# Patient Record
Sex: Male | Born: 1970
Health system: Southern US, Community
[De-identification: ages and names within clinical notes are randomized; demographics above are authoritative.]

## PROBLEM LIST (undated history)

## (undated) DIAGNOSIS — J189 Pneumonia, unspecified organism: Secondary | ICD-10-CM

## (undated) DIAGNOSIS — I10 Essential (primary) hypertension: Secondary | ICD-10-CM

## (undated) DIAGNOSIS — K219 Gastro-esophageal reflux disease without esophagitis: Secondary | ICD-10-CM

## (undated) DIAGNOSIS — Z992 Dependence on renal dialysis: Secondary | ICD-10-CM

## (undated) DIAGNOSIS — I739 Peripheral vascular disease, unspecified: Secondary | ICD-10-CM

## (undated) DIAGNOSIS — Z9989 Dependence on other enabling machines and devices: Secondary | ICD-10-CM

## (undated) DIAGNOSIS — N186 End stage renal disease: Secondary | ICD-10-CM

## (undated) DIAGNOSIS — N289 Disorder of kidney and ureter, unspecified: Secondary | ICD-10-CM

## (undated) DIAGNOSIS — T4145XA Adverse effect of unspecified anesthetic, initial encounter: Secondary | ICD-10-CM

## (undated) DIAGNOSIS — T8859XA Other complications of anesthesia, initial encounter: Secondary | ICD-10-CM

## (undated) DIAGNOSIS — Z9289 Personal history of other medical treatment: Secondary | ICD-10-CM

## (undated) DIAGNOSIS — G4733 Obstructive sleep apnea (adult) (pediatric): Secondary | ICD-10-CM

## (undated) HISTORY — DX: Peripheral vascular disease, unspecified: I73.9

---

## 2009-06-03 DIAGNOSIS — N4601 Organic azoospermia: Secondary | ICD-10-CM | POA: Insufficient documentation

## 2009-06-03 DIAGNOSIS — N62 Hypertrophy of breast: Secondary | ICD-10-CM | POA: Insufficient documentation

## 2009-06-14 DIAGNOSIS — E229 Hyperfunction of pituitary gland, unspecified: Secondary | ICD-10-CM | POA: Insufficient documentation

## 2012-07-17 HISTORY — PX: KIDNEY TRANSPLANT: SHX239

## 2012-07-17 HISTORY — PX: AV FISTULA PLACEMENT: SHX1204

## 2012-07-17 HISTORY — PX: PARATHYROIDECTOMY: SHX19

## 2018-01-29 ENCOUNTER — Inpatient Hospital Stay (HOSPITAL_COMMUNITY)
Admission: EM | Admit: 2018-01-29 | Discharge: 2018-02-04 | DRG: 682 | Disposition: A | Discharge status: Home / Self Care | Payer: Medicare Other | Attending: Internal Medicine | Admitting: Internal Medicine

## 2018-01-29 ENCOUNTER — Other Ambulatory Visit: Payer: Self-pay

## 2018-01-29 ENCOUNTER — Emergency Department (HOSPITAL_COMMUNITY): Payer: Medicare Other

## 2018-01-29 ENCOUNTER — Encounter (HOSPITAL_COMMUNITY): Payer: Self-pay

## 2018-01-29 DIAGNOSIS — I160 Hypertensive urgency: Secondary | ICD-10-CM

## 2018-01-29 DIAGNOSIS — I5030 Unspecified diastolic (congestive) heart failure: Secondary | ICD-10-CM | POA: Diagnosis present

## 2018-01-29 DIAGNOSIS — E890 Postprocedural hypothyroidism: Secondary | ICD-10-CM | POA: Diagnosis present

## 2018-01-29 DIAGNOSIS — D631 Anemia in chronic kidney disease: Secondary | ICD-10-CM | POA: Diagnosis present

## 2018-01-29 DIAGNOSIS — I248 Other forms of acute ischemic heart disease: Secondary | ICD-10-CM | POA: Diagnosis present

## 2018-01-29 DIAGNOSIS — T8619 Other complication of kidney transplant: Secondary | ICD-10-CM | POA: Diagnosis present

## 2018-01-29 DIAGNOSIS — J189 Pneumonia, unspecified organism: Secondary | ICD-10-CM | POA: Diagnosis present

## 2018-01-29 DIAGNOSIS — N179 Acute kidney failure, unspecified: Secondary | ICD-10-CM | POA: Diagnosis present

## 2018-01-29 DIAGNOSIS — F17290 Nicotine dependence, other tobacco product, uncomplicated: Secondary | ICD-10-CM | POA: Diagnosis present

## 2018-01-29 DIAGNOSIS — Z79899 Other long term (current) drug therapy: Secondary | ICD-10-CM | POA: Diagnosis not present

## 2018-01-29 DIAGNOSIS — Z9119 Patient's noncompliance with other medical treatment and regimen: Secondary | ICD-10-CM | POA: Diagnosis not present

## 2018-01-29 DIAGNOSIS — N186 End stage renal disease: Secondary | ICD-10-CM | POA: Diagnosis present

## 2018-01-29 DIAGNOSIS — I132 Hypertensive heart and chronic kidney disease with heart failure and with stage 5 chronic kidney disease, or end stage renal disease: Secondary | ICD-10-CM | POA: Diagnosis present

## 2018-01-29 DIAGNOSIS — D649 Anemia, unspecified: Secondary | ICD-10-CM

## 2018-01-29 HISTORY — DX: Essential (primary) hypertension: I10

## 2018-01-29 HISTORY — DX: Disorder of kidney and ureter, unspecified: N28.9

## 2018-01-29 HISTORY — DX: Other complications of anesthesia, initial encounter: T88.59XA

## 2018-01-29 HISTORY — DX: Dependence on renal dialysis: Z99.2

## 2018-01-29 HISTORY — DX: End stage renal disease: N18.6

## 2018-01-29 HISTORY — DX: Dependence on other enabling machines and devices: Z99.89

## 2018-01-29 HISTORY — DX: Obstructive sleep apnea (adult) (pediatric): G47.33

## 2018-01-29 HISTORY — DX: Adverse effect of unspecified anesthetic, initial encounter: T41.45XA

## 2018-01-29 LAB — CBC WITH DIFFERENTIAL/PLATELET
BASOS ABS: 0.1 10*3/uL (ref 0.0–0.1)
Basophils Relative: 1 %
Eosinophils Absolute: 0.1 10*3/uL (ref 0.0–0.7)
Eosinophils Relative: 1 %
HEMATOCRIT: 31 % — AB (ref 39.0–52.0)
Hemoglobin: 10.7 g/dL — ABNORMAL LOW (ref 13.0–17.0)
LYMPHS PCT: 9 %
Lymphs Abs: 1.2 10*3/uL (ref 0.7–4.0)
MCH: 30.3 pg (ref 26.0–34.0)
MCHC: 34.5 g/dL (ref 30.0–36.0)
MCV: 87.8 fL (ref 78.0–100.0)
Monocytes Absolute: 1.1 10*3/uL — ABNORMAL HIGH (ref 0.1–1.0)
Monocytes Relative: 9 %
NEUTROS ABS: 10.4 10*3/uL — AB (ref 1.7–7.7)
Neutrophils Relative %: 80 %
PLATELETS: 295 10*3/uL (ref 150–400)
RBC: 3.53 MIL/uL — ABNORMAL LOW (ref 4.22–5.81)
RDW: 15.6 % — ABNORMAL HIGH (ref 11.5–15.5)
WBC: 12.9 10*3/uL — AB (ref 4.0–10.5)

## 2018-01-29 LAB — COMPREHENSIVE METABOLIC PANEL
ALBUMIN: 3.7 g/dL (ref 3.5–5.0)
ALT: 11 U/L (ref 0–44)
ANION GAP: 23 — AB (ref 5–15)
AST: 35 U/L (ref 15–41)
Alkaline Phosphatase: 82 U/L (ref 38–126)
BILIRUBIN TOTAL: 0.7 mg/dL (ref 0.3–1.2)
BUN: 133 mg/dL — ABNORMAL HIGH (ref 6–20)
CO2: 14 mmol/L — ABNORMAL LOW (ref 22–32)
Calcium: 6.2 mg/dL — CL (ref 8.9–10.3)
Chloride: 107 mmol/L (ref 98–111)
Creatinine, Ser: 19.32 mg/dL — ABNORMAL HIGH (ref 0.61–1.24)
GFR calc Af Amer: 3 mL/min — ABNORMAL LOW (ref >60)
GFR, EST NON AFRICAN AMERICAN: 2 mL/min — AB (ref >60)
Glucose, Bld: 93 mg/dL (ref 70–99)
POTASSIUM: 5.1 mmol/L (ref 3.5–5.1)
Sodium: 144 mmol/L (ref 135–145)
TOTAL PROTEIN: 8.2 g/dL — AB (ref 6.5–8.1)

## 2018-01-29 LAB — ETHANOL: Alcohol, Ethyl (B): <10 mg/dL (ref <10)

## 2018-01-29 LAB — I-STAT TROPONIN, ED: Troponin i, poc: 0.14 ng/mL (ref 0.00–0.08)

## 2018-01-29 LAB — SALICYLATE LEVEL

## 2018-01-29 LAB — ACETAMINOPHEN LEVEL

## 2018-01-29 LAB — CK: Total CK: 268 U/L (ref 49–397)

## 2018-01-29 MED ORDER — ACETAMINOPHEN 325 MG PO TABS
650.0000 mg | ORAL_TABLET | Freq: Four times a day (QID) | ORAL | Status: DC | PRN
  Administered 2018-01-31: 650 mg via ORAL
  Filled 2018-01-29: qty 2

## 2018-01-29 MED ORDER — ONDANSETRON HCL 4 MG/2ML IJ SOLN
4.0000 mg | Freq: Four times a day (QID) | INTRAMUSCULAR | Status: DC | PRN
  Administered 2018-01-29: 4 mg via INTRAVENOUS
  Filled 2018-01-29: qty 2

## 2018-01-29 MED ORDER — VANCOMYCIN HCL 10 G IV SOLR
2500.0000 mg | Freq: Once | INTRAVENOUS | Status: DC
  Filled 2018-01-29: qty 2500

## 2018-01-29 MED ORDER — ACETAMINOPHEN 650 MG RE SUPP: 650.0000 mg | Freq: Four times a day (QID) | RECTAL | Status: DC | PRN

## 2018-01-29 MED ORDER — SODIUM CHLORIDE 0.9% FLUSH
3.0000 mL | Freq: Two times a day (BID) | INTRAVENOUS | Status: DC
  Administered 2018-01-30 – 2018-02-03 (×11): 3 mL via INTRAVENOUS

## 2018-01-29 MED ORDER — HYDRALAZINE HCL 20 MG/ML IJ SOLN
10.0000 mg | Freq: Once | INTRAMUSCULAR | Status: AC
  Administered 2018-01-30: 10 mg via INTRAVENOUS
  Filled 2018-01-29: qty 1

## 2018-01-29 MED ORDER — HEPARIN SODIUM (PORCINE) 5000 UNIT/ML IJ SOLN
5000.0000 [IU] | Freq: Three times a day (TID) | INTRAMUSCULAR | Status: DC
  Administered 2018-01-30 – 2018-02-04 (×17): 5000 [IU] via SUBCUTANEOUS
  Filled 2018-01-29 (×19): qty 1

## 2018-01-29 MED ORDER — SODIUM CHLORIDE 0.9 % IV BOLUS
1000.0000 mL | Freq: Once | INTRAVENOUS | Status: AC
  Administered 2018-01-29: 1000 mL via INTRAVENOUS

## 2018-01-29 MED ORDER — AMLODIPINE BESYLATE 5 MG PO TABS
5.0000 mg | ORAL_TABLET | Freq: Every day | ORAL | Status: DC
  Administered 2018-01-29 – 2018-02-02 (×5): 5 mg via ORAL
  Filled 2018-01-29 (×5): qty 1

## 2018-01-29 MED ORDER — HYDRALAZINE HCL 20 MG/ML IJ SOLN
10.0000 mg | Freq: Four times a day (QID) | INTRAMUSCULAR | Status: DC | PRN
  Administered 2018-01-29 – 2018-02-03 (×6): 10 mg via INTRAVENOUS
  Filled 2018-01-29 (×6): qty 1

## 2018-01-29 MED ORDER — SODIUM CHLORIDE 0.9 % IV SOLN
2.0000 g | Freq: Once | INTRAVENOUS | Status: AC
  Administered 2018-01-29: 2 g via INTRAVENOUS
  Filled 2018-01-29: qty 2

## 2018-01-29 MED ORDER — LABETALOL HCL 5 MG/ML IV SOLN
10.0000 mg | Freq: Once | INTRAVENOUS | Status: AC
  Administered 2018-01-29: 10 mg via INTRAVENOUS
  Filled 2018-01-29: qty 4

## 2018-01-29 MED ORDER — SODIUM CHLORIDE 0.9 % IV SOLN
250.0000 mL | INTRAVENOUS | Status: DC | PRN
  Administered 2018-01-30: 250 mL via INTRAVENOUS

## 2018-01-29 MED ORDER — CARVEDILOL 12.5 MG PO TABS
12.5000 mg | ORAL_TABLET | Freq: Two times a day (BID) | ORAL | Status: DC
  Administered 2018-01-30 – 2018-02-02 (×8): 12.5 mg via ORAL
  Filled 2018-01-29 (×8): qty 1

## 2018-01-29 MED ORDER — SODIUM CHLORIDE 0.9% FLUSH: 3.0000 mL | INTRAVENOUS | Status: DC | PRN

## 2018-01-29 MED ORDER — SODIUM CHLORIDE 0.9 % IV SOLN
500.0000 mg | INTRAVENOUS | Status: DC
  Administered 2018-01-29 – 2018-02-01 (×4): 500 mg via INTRAVENOUS
  Filled 2018-01-29 (×4): qty 500

## 2018-01-29 MED ORDER — SODIUM CHLORIDE 0.9 % IV SOLN
1.0000 g | INTRAVENOUS | Status: AC
  Administered 2018-01-30 – 2018-02-02 (×4): 1 g via INTRAVENOUS
  Filled 2018-01-29 (×4): qty 10

## 2018-01-29 NOTE — ED Notes (Signed)
Dr. Kim at bedside   

## 2018-01-29 NOTE — ED Notes (Signed)
Pt aware that urine sample is needed.  

## 2018-01-29 NOTE — ED Notes (Signed)
Bed: PT47 Expected date:  Expected time:  Means of arrival:  Comments: Hold for triage 1

## 2018-01-29 NOTE — ED Notes (Signed)
Bed: WLPT1 Expected date:  Expected time:  Means of arrival:  Comments: 

## 2018-01-29 NOTE — ED Notes (Signed)
ED TO INPATIENT HANDOFF REPORT  Name/Age/Gender Jerry [name] 47 y.o. male  Code Status   Home/SNF/Other Home  Chief Complaint Arm Swelling; SOB; Blood in Urine; Unable to eat/drink  Level of Care/Admitting Diagnosis ED Disposition    ED Disposition Condition [place]: [place] [100100]  Level of Care: Telemetry [5]  Diagnosis: ARF (acute renal failure) [facility]) [[id number]]  Admitting Physician: Jani Gravel [[id number]]  Attending Physician: Jani Gravel [phone]  Estimated length of stay: past midnight tomorrow  Certification:: I certify this patient will need inpatient services for at least 2 midnights  PT Class (Do Not Modify): Inpatient [101]  PT Acc Code (Do Not Modify): Private [1]       Medical History Past Medical History:  Diagnosis Date  . Hypertension   . Renal disorder     Allergies No Known Allergies  IV Location/Drains/Wounds Patient Lines/Drains/Airways Status   Active Line/Drains/Airways    None          Labs/Imaging Results for orders placed or performed during the hospital encounter of 01/29/18 (from the past 48 hour(s))  Ethanol     Status: None   Collection Time: 01/29/18  4:52 PM  Result Value Ref Range   Alcohol, Ethyl (B) <10 <10 mg/dL    Comment: (NOTE) Lowest detectable limit for serum alcohol is 10 mg/dL. For medical purposes only. Performed at [facility], [place] [address], [place], [place] [address]   Salicylate level     Status: None   Collection Time: 01/29/18  4:52 PM  Result Value Ref Range   Salicylate Lvl <6.5 2.8 - 30.0 mg/dL    Comment: Performed at [facility], [place] [address], [place], [place] [address]  Acetaminophen level     Status: Abnormal   Collection Time: 01/29/18  4:52 PM  Result Value Ref Range   Acetaminophen (Tylenol), Serum <10 (L) 10 - 30 ug/mL    Comment: (NOTE) Therapeutic concentrations vary significantly. A range of 10-30  ug/mL  may be an effective concentration for many patients. However, some  are best treated at concentrations outside of this range. Acetaminophen concentrations >150 ug/mL at 4 hours after ingestion  and >50 ug/mL at 12 hours after ingestion are often associated with  toxic reactions. Performed at [facility], [place] [address], [place], [place] [address]   I-stat troponin, ED     Status: Abnormal   Collection Time: 01/29/18  4:59 PM  Result Value Ref Range   Troponin i, poc 0.14 (HH) 0.00 - 0.08 ng/mL   Comment NOTIFIED PHYSICIAN    Comment 3            Comment: Due to the release kinetics of cTnI, a negative result within the first hours of the onset of symptoms does not rule out myocardial infarction with certainty. If myocardial infarction is still suspected, repeat the test at appropriate intervals.   Comprehensive metabolic panel     Status: Abnormal   Collection Time: 01/29/18  5:05 PM  Result Value Ref Range   Sodium 144 135 - 145 mmol/L    Comment: REPEATED TO VERIFY   Potassium 5.1 3.5 - 5.1 mmol/L    Comment: REPEATED TO VERIFY   Chloride 107 98 - 111 mmol/L    Comment: Please note change in reference range. REPEATED TO VERIFY    CO2 14 (L) 22 - 32 mmol/L    Comment: REPEATED TO VERIFY   Glucose,  Bld 93 70 - 99 mg/dL    Comment: Please note change in reference range.   BUN 133 (H) 6 - 20 mg/dL    Comment: RESULTS CONFIRMED BY MANUAL DILUTION   Creatinine, Ser 19.32 (H) 0.61 - 1.24 mg/dL   Calcium 6.2 (LL) 8.9 - 10.3 mg/dL    Comment: CRITICAL RESULT CALLED TO, READ BACK BY AND VERIFIED WITH: ZULETA,K RN [id number] COVINGTON,N    Total Protein 8.2 (H) 6.5 - 8.1 g/dL   Albumin 3.7 3.5 - 5.0 g/dL   AST 35 15 - 41 U/L   ALT 11 0 - 44 U/L    Comment: Please note change in reference range.   Alkaline Phosphatase 82 38 - 126 U/L   Total Bilirubin 0.7 0.3 - 1.2 mg/dL   GFR calc non Af Amer 2 (L) >60 mL/min   GFR calc Af Amer 3 (L) >60 mL/min     Comment: (NOTE) The eGFR has been calculated using the CKD EPI equation. This calculation has not been validated in all clinical situations. eGFR's persistently <60 mL/min signify possible Chronic Kidney Disease.    Anion gap 23 (H) 5 - 15    Comment: Performed at [facility], [place] [address] [place], [place] [address]  CBC with Differential     Status: Abnormal   Collection Time: 01/29/18  5:05 PM  Result Value Ref Range   WBC 12.9 (H) 4.0 - 10.5 K/uL   RBC 3.53 (L) 4.22 - 5.81 MIL/uL   Hemoglobin 10.7 (L) 13.0 - 17.0 g/dL   HCT 31.0 (L) 39.0 - 52.0 %   MCV 87.8 78.0 - 100.0 fL   MCH 30.3 26.0 - 34.0 pg   MCHC 34.5 30.0 - 36.0 g/dL   RDW 15.6 (H) 11.5 - 15.5 %   Platelets 295 150 - 400 K/uL   Neutrophils Relative % 80 %   Neutro Abs 10.4 (H) 1.7 - 7.7 K/uL   Lymphocytes Relative 9 %   Lymphs Abs 1.2 0.7 - 4.0 K/uL   Monocytes Relative 9 %   Monocytes Absolute 1.1 (H) 0.1 - 1.0 K/uL   Eosinophils Relative 1 %   Eosinophils Absolute 0.1 0.0 - 0.7 K/uL   Basophils Relative 1 %   Basophils Absolute 0.1 0.0 - 0.1 K/uL    Comment: Performed at [facility], [place] [address], [place], [place] [address]  CK     Status: None   Collection Time: 01/29/18  5:05 PM  Result Value Ref Range   Total CK 268 49 - 397 U/L    Comment: Performed at [facility], [place] [address], [place], [place] [address]   Dg Chest 2 View  Result Date: 01/29/2018 CLINICAL DATA:  Shortness of breath and chest pain EXAM: CHEST - 2 VIEW COMPARISON:  None. FINDINGS: There is airspace consolidation in the left upper lobe, felt to represent pneumonia. There is more subtle consolidation in the left base. There is a right pleural effusion with fluid tracking into the right minor fissure. There is cardiomegaly with pulmonary venous hypertension. No adenopathy. No evident bone lesion. IMPRESSION: Left upper lobe airspace consolidation, felt to represent pneumonia.  Smaller area of consolidation left base. Right pleural effusion. Pulmonary vascular congestion. Electronically Signed   By: Lowella Grip III M.D.   On: 01/29/2018 14:04    Pending Labs Unresulted Labs (From admission, onward)   Start     Ordered   01/29/18 1732  Urinalysis, Routine  w reflex microscopic  STAT,   STAT     01/29/18 1731   01/29/18 1350  Rapid urine drug screen (hospital performed)  Once,   STAT     01/29/18 1351      Vitals/Pain Today's Vitals   01/29/18 1935 01/29/18 1945 01/29/18 2000 01/29/18 2030  BP:  (!) 188/117 (!) 188/128 (!) 178/102  Pulse:  93 91 92  Resp:  (!) 22 (!) 25 16  Temp:      TempSrc:      SpO2:  98% 97% 96%  Weight:      Height:      PainSc: 0-No pain       Isolation Precautions No active isolations  Medications Medications  sodium chloride 0.9 % bolus 1,000 mL (0 mLs Intravenous Stopped 01/29/18 2051)  labetalol (NORMODYNE,TRANDATE) injection 10 mg (10 mg Intravenous Given 01/29/18 1805)  ceFEPIme (MAXIPIME) 2 g in sodium chloride 0.9 % 100 mL IVPB (0 g Intravenous Stopped 01/29/18 1947)    Mobility walks with person assist (due to weakness)

## 2018-01-29 NOTE — ED Notes (Signed)
[facility] contacted 

## 2018-01-29 NOTE — Progress Notes (Signed)
A consult was received from an ED physician for cefepime per pharmacy dosing (for an indication other than meningitis). The patient's profile has been reviewed for ht/wt/allergies/indication/available labs. A one time order has been placed for the above antibiotics.  Further antibiotics/pharmacy consults should be ordered by admitting physician if indicated.  NOTE: Both vancomycin and cefepime consults were initially ordered; however after speaking with EDP regarding poor renal function s/p transplant, it was decided to hold off on vanc for now and defer MRSA coverage to admitting MD.                 Reuel Boom, PharmD, BCPS [phone] 01/29/2018, 6:44 PM

## 2018-01-29 NOTE — ED Provider Notes (Signed)
[facility] DEPT Provider Note   CSN: [id number] Arrival date & time: 01/29/18  1259     History   Chief Complaint Chief Complaint  Patient presents with  . Shortness of Breath  . Epistaxis  . Arm Swelling  . Hematuria  . Suicidal    HPI Jerry [name] is a 47 y.o. male with a past medical history of hypertension, status post kidney transplant 5 years ago, who presents to ED for multiple complaints.  He reports shortness of breath and cough productive with mucus for the past week, as well as chest tightness.  He also reports hematuria for the past 2 days and decreased urination. States that epistaxis began yesterday.  He does report nasal congestion and rhinorrhea.  Denies any blood thinner use.  States that the epistaxis has improved without intervention.  He reports nausea, decreased appetite.  States that he is anxious that there is something going wrong with him.  He denies any SI, HI, AVH.  Patient is visiting [place] from [place].  States that he has not taken his blood pressure medication in 1 month, because "I just don't feel like it."  Patient had a 7hr car drive to [place] last month. Denies any leg swelling, wheezing, abdominal pain, fever, sick contacts, prior MI, DVT or PE, recent surgeries, headache, vision changes.  HPI  Past Medical History:  Diagnosis Date  . Hypertension   . Renal disorder     Patient Active Problem List   Diagnosis Date Noted  . ARF (acute renal failure) ([facility]) 01/29/2018    Past Surgical History:  Procedure Laterality Date  . KIDNEY TRANSPLANT          Home Medications    Prior to Admission medications   Medication Sig Start Date End Date Taking? Authorizing Provider  escitalopram (LEXAPRO) 20 MG tablet Take 10 mg by mouth daily.     [provider]  lisinopril (PRINIVIL,ZESTRIL) 2.5 MG tablet Take 10 mg by mouth daily.     [provider]  Mycophenolate Sodium (MYCOPHENOLIC  ACID PO) Take 1 tablet by mouth daily.    [provider]  tacrolimus (PROGRAF) 0.5 MG capsule Take 0.5 mg by mouth 2 (two) times daily.    [provider]    Family History History reviewed. No pertinent family history.  Social History Social History   Tobacco Use  . Smoking status: Current Some Day Smoker    Types: Cigars  . Smokeless tobacco: Never Used  Substance Use Topics  . Alcohol use: Yes    Comment: occasionally  . Drug use: Never     Allergies   Patient has no known allergies.   Review of Systems Review of Systems  Constitutional: Positive for appetite change. Negative for chills and fever.  HENT: Positive for congestion and rhinorrhea. Negative for ear pain, sneezing and sore throat.   Eyes: Negative for photophobia and visual disturbance.  Respiratory: Positive for cough, chest tightness and shortness of breath. Negative for wheezing.   Cardiovascular: Negative for chest pain and palpitations.  Gastrointestinal: Positive for nausea. Negative for abdominal pain, blood in stool, constipation, diarrhea and vomiting.  Genitourinary: Positive for hematuria. Negative for dysuria, flank pain and urgency.  Musculoskeletal: Negative for myalgias.  Skin: Negative for rash.  Neurological: Negative for dizziness, weakness and light-headedness.  Psychiatric/Behavioral: The patient is nervous/anxious.      Physical Exam Updated Vital Signs BP (!) 178/102   Pulse 92   Temp 98.8 F (37.1  C) (Oral)   Resp 16   Ht 5\' 8"  (1.727 m)   Wt 101.5 kg (223 lb 11.2 oz)   SpO2 96%   BMI 34.01 kg/m   Physical Exam  Constitutional: He appears well-developed and well-nourished. No distress.  HENT:  Head: Normocephalic and atraumatic.  Nose: Nose normal.  Eyes: Conjunctivae and EOM are normal. Left eye exhibits no discharge. No scleral icterus.  Neck: Normal range of motion. Neck supple.  Cardiovascular: Normal rate, regular rhythm, normal heart sounds and  intact distal pulses. Exam reveals no gallop and no friction rub.  No murmur heard. Pulmonary/Chest: Effort normal and breath sounds normal. No respiratory distress.  Abdominal: Soft. Bowel sounds are normal. He exhibits no distension. There is no tenderness. There is no guarding.  No abdominal tenderness to palpation.  Musculoskeletal: Normal range of motion. He exhibits no edema.  No lower extremity edema, erythema or calf tenderness bilaterally.  Neurological: He is alert. He exhibits normal muscle tone. Coordination normal.  Skin: Skin is warm and dry. No rash noted.  Psychiatric: He has a normal mood and affect.  Nursing note and vitals reviewed.    ED Treatments / Results  Labs (all labs ordered are listed, but only abnormal results are displayed) Labs Reviewed  ACETAMINOPHEN LEVEL - Abnormal; Notable for the following components:      Result Value   Acetaminophen (Tylenol), Serum <10 (*)    All other components within normal limits  COMPREHENSIVE METABOLIC PANEL - Abnormal; Notable for the following components:   CO2 14 (*)    BUN 133 (*)    Creatinine, Ser 19.32 (*)    Calcium 6.2 (*)    Total Protein 8.2 (*)    GFR calc non Af Amer 2 (*)    GFR calc Af Amer 3 (*)    Anion gap 23 (*)    All other components within normal limits  CBC WITH DIFFERENTIAL/PLATELET - Abnormal; Notable for the following components:   WBC 12.9 (*)    RBC 3.53 (*)    Hemoglobin 10.7 (*)    HCT 31.0 (*)    RDW 15.6 (*)    Neutro Abs 10.4 (*)    Monocytes Absolute 1.1 (*)    All other components within normal limits  I-STAT TROPONIN, ED - Abnormal; Notable for the following components:   Troponin i, poc 0.14 (*)    All other components within normal limits  ETHANOL  SALICYLATE LEVEL  CK  RAPID URINE DRUG SCREEN, HOSP PERFORMED  URINALYSIS, ROUTINE W REFLEX MICROSCOPIC    EKG EKG Interpretation  Date/Time:  Tuesday January 29 2018 13:50:09 EDT Ventricular Rate:  101 PR Interval:      QRS Duration: 90 QT Interval:  397 QTC Calculation: 515 R Axis:   -31 Text Interpretation:  Sinus tachycardia LAE, consider biatrial enlargement Left ventricular hypertrophy Anterior infarct, old Abnormal T, consider ischemia, lateral leads Prolonged QT interval No old tracing to compare Confirmed by Duffy Bruce [id number] on 01/29/2018 5:43:46 PM   Radiology Dg Chest 2 View  Result Date: 01/29/2018 CLINICAL DATA:  Shortness of breath and chest pain EXAM: CHEST - 2 VIEW COMPARISON:  None. FINDINGS: There is airspace consolidation in the left upper lobe, felt to represent pneumonia. There is more subtle consolidation in the left base. There is a right pleural effusion with fluid tracking into the right minor fissure. There is cardiomegaly with pulmonary venous hypertension. No adenopathy. No evident bone lesion. IMPRESSION: Left upper lobe airspace  consolidation, felt to represent pneumonia. Smaller area of consolidation left base. Right pleural effusion. Pulmonary vascular congestion. Electronically Signed   By: Lowella Grip III M.D.   On: 01/29/2018 14:04    Procedures Procedures (including critical care time)  CRITICAL CARE Performed by: Delia Heady   Total critical care time: 50 minutes  Critical care time was exclusive of separately billable procedures and treating other patients.  Critical care was necessary to treat or prevent imminent or life-threatening deterioration.  Critical care was time spent personally by me on the following activities: development of treatment plan with patient and/or surrogate as well as nursing, discussions with consultants, evaluation of patient's response to treatment, examination of patient, obtaining history from patient or surrogate, ordering and performing treatments and interventions, ordering and review of laboratory studies, ordering and review of radiographic studies, pulse oximetry and re-evaluation of patient's condition.   Medications  Ordered in ED Medications  sodium chloride 0.9 % bolus 1,000 mL (1,000 mLs Intravenous New Bag/Given 01/29/18 1713)  labetalol (NORMODYNE,TRANDATE) injection 10 mg (10 mg Intravenous Given 01/29/18 1805)  ceFEPIme (MAXIPIME) 2 g in sodium chloride 0.9 % 100 mL IVPB (0 g Intravenous Stopped 01/29/18 1947)     Initial Impression / Assessment and Plan / ED Course  I have reviewed the triage vital signs and the nursing notes.  Pertinent labs & imaging results that were available during my care of the patient were reviewed by me and considered in my medical decision making (see chart for details).     47 year old male with past medical history of hypertension, status post kidney transplant 5 years ago presents for multiple complaints.  He reports shortness of breath, cough productive with mucus, chest tightness.  Reports hematuria for the past 2 days and decreased urination.  Reports epistaxis that began yesterday which has resolved.  Reports nausea and decreased appetite.  Denies any SI, HI, AVH but states that he is anxious that there is something wrong with him.  Patient is noncompliant with both his hypertension medication and Prograf.  On physical exam he is overall well-appearing.  He is hypertensive to 263 systolic.  Chest x-ray shows pneumonia.  CMP shows creatinine of 19 and BUN of 133.  Troponin elevated at 0.14.  [facility] states that the patient has not followed up with their group in 2-1/2 years.  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  Spoke to Dr. Justin Mend who recommends transfer to [facility] for dialysis in the a.m.  Hospitalist to admit.  Patient treated with cefepime for pneumonia.  Given IV labetalol here to help with blood pressure. BP improved. Patient discussed with and seen by my attending, Dr. Ellender Hose.  Portions of this note were generated with [employer]. Dictation errors may  occur despite best attempts at proofreading.   Final Clinical Impressions(s) / ED Diagnoses   Final diagnoses:  Acute renal failure, unspecified acute renal failure type [facility])    ED Discharge Orders    None       Delia Heady, PA-C 01/29/18 2051    Duffy Bruce, MD 01/30/18 1001

## 2018-01-29 NOTE — H&P (Addendum)
TRH H&P   Patient Demographics:    Jerry [name], is a 47 y.o. male  MRN: [id number]   DOB - 09/07/1970  Admit Date - 01/29/2018  Outpatient Primary MD for the patient is [facility] [place] Not In Dr. Junious Silk in [place], [place]   Referring MD/NP/PA: Delia Heady  Outpatient Specialists:   Renae Fickle (nephrologist) in [place], [place] w [facility]  Patient coming from: home  Chief Complaint  Patient presents with  . Shortness of Breath  . Epistaxis  . Arm Swelling  . Hematuria  . Suicidal      HPI:    Jerry [name]  is a 47 y.o. male, w hx of ESRD s/p renal transplant, w normal creatinine previously , apparently had not been taking his transplant medication for the past 1 month.  Pt presents due blood in urine, decrease in appetite along with nausea and vomitting  and then has not made urine today .  Pt has slight congestion, slight dyspnea. + orthopnea.   Denies fever, chills, cp, palp.   In ED, nephrology consulted (Dr Justin Mend) who recommended transfer to [facility]  CXR IMPRESSION: Left upper lobe airspace consolidation, felt to represent pneumonia. Smaller area of consolidation left base.  Right pleural effusion.  Pulmonary vascular congestion.  Tylenol level <93 Salicylate level <7 Trop 0.14  Na 144, K 5.1, Bun 133, Creatinine 19.32 Ast 35, Alt 11 Wbc 12.9, Hgb 10.7, Plt 295 CPK 268  Pt will be admitted for ARF, CAP, troponin elevation likely secondary to demand ischemia,  /CHF     Review of systems:    In addition to the HPI above, No Fever-chills, No Headache, No changes with Vision or hearing, No problems swallowing food or Liquids, No Chest pain, No Abdominal pain,  Bowel movements are regular, No Blood in stool   No dysuria, No new skin rashes or bruises, No new joints pains-aches,  No new weakness, tingling, numbness in any extremity, No  recent weight gain or loss, No polyuria, polydypsia or polyphagia, No significant Mental Stressors.  A full 10 point Review of Systems was done, except as stated above, all other Review of Systems were negative.   With Past History of the following :    Past Medical History:  Diagnosis Date  . CKD (chronic kidney disease)   . Hypertension   . Renal disorder       Past Surgical History:  Procedure Laterality Date  . KIDNEY TRANSPLANT     at [facility]  . PARATHYROIDECTOMY     at [facility]      Social History:     Social History   Tobacco Use  . Smoking status: Current Some Day Smoker    Types: Cigars  . Smokeless tobacco: Never Used  Substance Use Topics  . Alcohol use: Yes    Comment: occasionally     Lives - by self  in [place], [place]  Mobility - walks by self   Family History :     Family History  Problem Relation Age of Onset  . Heart failure Mother   . Kidney failure Father        Home Medications:   Prior to Admission medications   Medication Sig Start Date End Date Taking? Authorizing Provider  escitalopram (LEXAPRO) 20 MG tablet Take 10 mg by mouth daily.     [provider]  lisinopril (PRINIVIL,ZESTRIL) 2.5 MG tablet Take 10 mg by mouth daily.     [provider]  Mycophenolate Sodium (MYCOPHENOLIC ACID PO) Take 1 tablet by mouth daily.    [provider]  tacrolimus (PROGRAF) 0.5 MG capsule Take 0.5 mg by mouth 2 (two) times daily.    [provider]     Allergies:    No Known Allergies   Physical Exam:   Vitals  Blood pressure (!) 215/137, pulse 92, temperature 98.8 F (37.1 C), temperature source Oral, resp. rate 20, height 5\' 8"  (1.727 m), weight 101.5 kg (223 lb 11.2 oz), SpO2 97 %.   1. General  lying in bed in NAD,   2. Normal affect and insight, Not Suicidal or Homicidal, Awake Alert, Oriented X 3.  3. No F.N deficits, ALL C.Nerves Intact, Strength 5/5 all 4 extremities, Sensation intact all 4  extremities, Plantars down going.  4. Ears and Eyes appear Normal, Conjunctivae clear, PERRLA. Moist Oral Mucosa.  5. Supple Neck, No JVD, No cervical lymphadenopathy appriciated, No Carotid Bruits.  6. Symmetrical Chest wall movement, Good air movement bilaterally, decrease bs at right base, faint crackle left lung base, no wheezing  7. RRR, No Gallops, Rubs or Murmurs, No Parasternal Heave.  8. Positive Bowel Sounds, Abdomen Soft, No tenderness, No organomegaly appriciated,No rebound -guarding or rigidity.  9.  No Cyanosis, Normal Skin Turgor, No Skin Rash or Bruise.  10. Good muscle tone,  joints appear normal , no effusions, Normal ROM.  11. No Palpable Lymph Nodes in Neck or Axillae   R AVF   Data Review:    CBC Recent Labs  Lab 01/29/18 1705  WBC 12.9*  HGB 10.7*  HCT 31.0*  PLT 295  MCV 87.8  MCH 30.3  MCHC 34.5  RDW 15.6*  LYMPHSABS 1.2  MONOABS 1.1*  EOSABS 0.1  BASOSABS 0.1   ------------------------------------------------------------------------------------------------------------------  Chemistries  Recent Labs  Lab 01/29/18 1705  NA 144  K 5.1  CL 107  CO2 14*  GLUCOSE 93  BUN 133*  CREATININE 19.32*  CALCIUM 6.2*  AST 35  ALT 11  ALKPHOS 82  BILITOT 0.7   ------------------------------------------------------------------------------------------------------------------ estimated creatinine clearance is 5.5 mL/min (A) (by C-G formula based on SCr of 19.32 mg/dL (H)). ------------------------------------------------------------------------------------------------------------------ No results for input(s): TSH, T4TOTAL, T3FREE, THYROIDAB in the last 72 hours.  Invalid input(s): FREET3  Coagulation profile No results for input(s): INR, PROTIME in the last 168 hours. ------------------------------------------------------------------------------------------------------------------- No results for input(s): DDIMER in the last 72  hours. -------------------------------------------------------------------------------------------------------------------  Cardiac Enzymes No results for input(s): CKMB, TROPONINI, MYOGLOBIN in the last 168 hours.  Invalid input(s): CK ------------------------------------------------------------------------------------------------------------------ No results found for: BNP   ---------------------------------------------------------------------------------------------------------------  Urinalysis No results found for: COLORURINE, APPEARANCEUR, LABSPEC, [place], GLUCOSEU, HGBUR, BILIRUBINUR, KETONESUR, PROTEINUR, UROBILINOGEN, NITRITE, LEUKOCYTESUR  ----------------------------------------------------------------------------------------------------------------   Imaging Results:    Dg Chest 2 View  Result Date: 01/29/2018 CLINICAL DATA:  Shortness of breath and chest pain EXAM: CHEST - 2 VIEW COMPARISON:  None. FINDINGS: There  is airspace consolidation in the left upper lobe, felt to represent pneumonia. There is more subtle consolidation in the left base. There is a right pleural effusion with fluid tracking into the right minor fissure. There is cardiomegaly with pulmonary venous hypertension. No adenopathy. No evident bone lesion. IMPRESSION: Left upper lobe airspace consolidation, felt to represent pneumonia. Smaller area of consolidation left base. Right pleural effusion. Pulmonary vascular congestion. Electronically Signed   By: Lowella Grip III M.D.   On: 01/29/2018 14:04       Assessment & Plan:    Principal Problem:   ARF (acute renal failure) (HCC) Active Problems:   Hypertensive urgency   Anemia    ARF STOP Lisinopril  Defer to nephrology regarding attempt at hydration vs dialysis Probably needs dialysis Nephrology consulted by Ed, appreciate input Check cmp in am  Hypertensive urgency Start Amlodipine 5mg  po qday Start carvedilol 12.5mg  po  bid Hydralazine 10mg  iv q6h prn sbp >160  Troponin elevation Trop I q6h x3 Check cardiac echo  ESRD s/p transplant Defer to nephrology regarding transplant medications  Anemia Check cbc in am  Hypocalcemia Check ionized calcium, check magnesium  CAP/ right pleural effusion Blood culture x2 Sputum gram stain , culture Urine legionella, antigen Urine strep antigen Rocephin 1gm iv qday Zithromax 500mg  iv qday   DVT Prophylaxis Heparin - SCDs  AM Labs Ordered, also please review Full Orders  Family Communication: Admission, patients condition and plan of care including tests being ordered have been discussed with the patient  who indicate understanding and agree with the plan and Code Status.  Code Status FULL CODE  Likely DC to  home  Condition GUARDED   Consults called: nephrology by ED<   Admission status: inpatient   Time spent in minutes : 60   Jani Gravel M.D on 01/29/2018 at 9:21 PM  Between 7am to 7pm - Pager - [phone]   After 7pm go to [email or web address] - password [facility]  [facility] - Office  [phone]

## 2018-01-29 NOTE — ED Notes (Signed)
>  6 unsuccessful IV attempts by myself and IV team. Catalina Antigua, RN will attempt an ultrasound IV. Pt may need an EJ

## 2018-01-29 NOTE — ED Notes (Signed)
Pt's partner states Pt has been having some panic attacks recently. Pt also states he feels like he is having kidney failure. Pt also states he had a kidney transplant 5 years ago.

## 2018-01-29 NOTE — ED Triage Notes (Addendum)
Patient c/o SOB x 1 week, hematuria x 2 days,  Right arm swelling today(fistula present. Patient had a kidney transplant 5 years ago.  Patient hypertensive in triage. Patient states he has not taken his BP meds x 1 month. Patient was not willing to discuss why he was not taking the BP meds

## 2018-01-29 NOTE — ED Notes (Addendum)
Delay in transferring pt due to elevated blood pressure. Will contact [facility] once hydrALAZINE is given

## 2018-01-30 ENCOUNTER — Inpatient Hospital Stay (HOSPITAL_COMMUNITY): Payer: Medicare Other

## 2018-01-30 ENCOUNTER — Encounter (HOSPITAL_COMMUNITY): Payer: Self-pay | Admitting: General Practice

## 2018-01-30 DIAGNOSIS — I361 Nonrheumatic tricuspid (valve) insufficiency: Secondary | ICD-10-CM

## 2018-01-30 LAB — CREATININE, SERUM
Creatinine, Ser: 19.14 mg/dL — ABNORMAL HIGH (ref 0.61–1.24)
GFR calc non Af Amer: 2 mL/min — ABNORMAL LOW (ref >60)
GFR, EST AFRICAN AMERICAN: 3 mL/min — AB (ref >60)

## 2018-01-30 LAB — HIV ANTIBODY (ROUTINE TESTING W REFLEX): HIV SCREEN 4TH GENERATION: NONREACTIVE

## 2018-01-30 LAB — CBC
HCT: 28.8 % — ABNORMAL LOW (ref 39.0–52.0)
Hemoglobin: 9.6 g/dL — ABNORMAL LOW (ref 13.0–17.0)
MCH: 29.7 pg (ref 26.0–34.0)
MCHC: 33.3 g/dL (ref 30.0–36.0)
MCV: 89.2 fL (ref 78.0–100.0)
Platelets: 267 10*3/uL (ref 150–400)
RBC: 3.23 MIL/uL — ABNORMAL LOW (ref 4.22–5.81)
RDW: 15.8 % — AB (ref 11.5–15.5)
WBC: 13.5 10*3/uL — ABNORMAL HIGH (ref 4.0–10.5)

## 2018-01-30 LAB — COMPREHENSIVE METABOLIC PANEL
ALBUMIN: 3.3 g/dL — AB (ref 3.5–5.0)
ALK PHOS: 70 U/L (ref 38–126)
ALT: 11 U/L (ref 0–44)
ANION GAP: 21 — AB (ref 5–15)
AST: 33 U/L (ref 15–41)
BUN: 143 mg/dL — ABNORMAL HIGH (ref 6–20)
CALCIUM: 5.8 mg/dL — AB (ref 8.9–10.3)
CO2: 12 mmol/L — AB (ref 22–32)
Chloride: 108 mmol/L (ref 98–111)
Creatinine, Ser: 19.14 mg/dL — ABNORMAL HIGH (ref 0.61–1.24)
GFR calc Af Amer: 3 mL/min — ABNORMAL LOW (ref >60)
GFR calc non Af Amer: 2 mL/min — ABNORMAL LOW (ref >60)
GLUCOSE: 101 mg/dL — AB (ref 70–99)
Potassium: 5.1 mmol/L (ref 3.5–5.1)
SODIUM: 141 mmol/L (ref 135–145)
TOTAL PROTEIN: 7.5 g/dL (ref 6.5–8.1)
Total Bilirubin: 0.6 mg/dL (ref 0.3–1.2)

## 2018-01-30 LAB — RENAL FUNCTION PANEL
ALBUMIN: 3 g/dL — AB (ref 3.5–5.0)
ANION GAP: 18 — AB (ref 5–15)
BUN: 145 mg/dL — ABNORMAL HIGH (ref 6–20)
CALCIUM: 6.2 mg/dL — AB (ref 8.9–10.3)
CO2: 14 mmol/L — ABNORMAL LOW (ref 22–32)
Chloride: 109 mmol/L (ref 98–111)
Creatinine, Ser: 19.35 mg/dL — ABNORMAL HIGH (ref 0.61–1.24)
GFR calc non Af Amer: 2 mL/min — ABNORMAL LOW (ref >60)
GFR, EST AFRICAN AMERICAN: 3 mL/min — AB (ref >60)
Glucose, Bld: 105 mg/dL — ABNORMAL HIGH (ref 70–99)
PHOSPHORUS: 9.7 mg/dL — AB (ref 2.5–4.6)
POTASSIUM: 4.9 mmol/L (ref 3.5–5.1)
SODIUM: 141 mmol/L (ref 135–145)

## 2018-01-30 LAB — MAGNESIUM: Magnesium: 2.2 mg/dL (ref 1.7–2.4)

## 2018-01-30 LAB — PHOSPHORUS: PHOSPHORUS: 8.7 mg/dL — AB (ref 2.5–4.6)

## 2018-01-30 LAB — TROPONIN I
TROPONIN I: 0.12 ng/mL — AB (ref <0.03)
Troponin I: 0.1 ng/mL (ref <0.03)
Troponin I: 0.08 ng/mL (ref <0.03)

## 2018-01-30 LAB — ECHOCARDIOGRAM COMPLETE
Height: 68 in
Weight: 3597.91 oz

## 2018-01-30 LAB — GLUCOSE, CAPILLARY: GLUCOSE-CAPILLARY: 135 mg/dL — AB (ref 70–99)

## 2018-01-30 LAB — MRSA PCR SCREENING: MRSA by PCR: NEGATIVE

## 2018-01-30 MED ORDER — HEPARIN SODIUM (PORCINE) 1000 UNIT/ML DIALYSIS
20.0000 [IU]/kg | INTRAMUSCULAR | Status: DC | PRN
  Filled 2018-01-30: qty 2

## 2018-01-30 MED ORDER — OXYMETAZOLINE HCL 0.05 % NA SOLN
2.0000 | Freq: Two times a day (BID) | NASAL | Status: DC | PRN
  Filled 2018-01-30: qty 15

## 2018-01-30 MED ORDER — HEPARIN SODIUM (PORCINE) 1000 UNIT/ML DIALYSIS
1000.0000 [IU] | INTRAMUSCULAR | Status: DC | PRN
  Filled 2018-01-30: qty 1

## 2018-01-30 MED ORDER — SODIUM CHLORIDE 0.9 % IV SOLN: 100.0000 mL | INTRAVENOUS | Status: DC | PRN

## 2018-01-30 MED ORDER — ALTEPLASE 2 MG IJ SOLR
2.0000 mg | Freq: Once | INTRAMUSCULAR | Status: DC | PRN
  Filled 2018-01-30: qty 2

## 2018-01-30 MED ORDER — PENTAFLUOROPROP-TETRAFLUOROETH EX AERO: 1.0000 "application " | INHALATION_SPRAY | CUTANEOUS | Status: DC | PRN

## 2018-01-30 MED ORDER — SODIUM CHLORIDE 0.9 % IV SOLN
2.0000 g | Freq: Once | INTRAVENOUS | Status: AC
  Administered 2018-01-30: 2 g via INTRAVENOUS
  Filled 2018-01-30: qty 20

## 2018-01-30 MED ORDER — CHLORHEXIDINE GLUCONATE CLOTH 2 % EX PADS
6.0000 | MEDICATED_PAD | Freq: Every day | CUTANEOUS | Status: DC
  Administered 2018-01-30 – 2018-02-02 (×3): 6 via TOPICAL

## 2018-01-30 MED ORDER — LIDOCAINE HCL (PF) 1 % IJ SOLN
5.0000 mL | INTRAMUSCULAR | Status: DC | PRN
  Filled 2018-01-30: qty 5

## 2018-01-30 MED ORDER — LIDOCAINE-PRILOCAINE 2.5-2.5 % EX CREA
1.0000 "application " | TOPICAL_CREAM | CUTANEOUS | Status: DC | PRN
  Filled 2018-01-30: qty 5

## 2018-01-30 MED ORDER — METHYLPREDNISOLONE SODIUM SUCC 125 MG IJ SOLR
60.0000 mg | Freq: Three times a day (TID) | INTRAMUSCULAR | Status: DC
  Administered 2018-01-30 – 2018-02-01 (×5): 60 mg via INTRAVENOUS
  Filled 2018-01-30 (×5): qty 2

## 2018-01-30 MED ORDER — NEPRO/CARBSTEADY PO LIQD
237.0000 mL | Freq: Two times a day (BID) | ORAL | Status: DC
  Administered 2018-01-31 – 2018-02-04 (×5): 237 mL via ORAL

## 2018-01-30 NOTE — Progress Notes (Addendum)
CRITICAL VALUE ALERT  Critical Value:  Calcium 5.8  Date & Time Notied:  01/30/18 at [place]  Provider Notified: Schorr,NP  Orders Received/Actions taken: Schorr,NP placed order for calcium gluconate.

## 2018-01-30 NOTE — Progress Notes (Signed)
  Echocardiogram 2D Echocardiogram has been performed.  Jerry [name] G Jerry [name] 01/30/2018, 11:52 AM

## 2018-01-30 NOTE — Consult Note (Signed)
[facility] CONSULT NOTE    Date: 01/30/2018                  Patient Name:  Jerry [name]  MRN: [id number]  DOB: [birth year]  Age / Sex: 47 y.o., male         PCP: [facility] P[facility] Not In                 Service Requesting Consult: Dr. Maudie Mercury                  Reason for Consult: Acute renal failure             History of Present Illness: Patient is a 47 y.o. male with a PMHx of ESRD 2/2 HTN s/p renal transplant in 2014 at [facility], HTN, parathyroid hyperplasia s/p parathyroidectomy who presented to the ED with several days of nausea, vomiting, decreased appetite, hematuria, and most recently an area.  Patient had a renal transplant 5 years ago at [facility] and had been on MMF and Prograf for immunosuppression.  He stopped taking his medication 3 months ago when he was last to follow-up.  He has been feeling well until about 1 week ago when he started experiencing the symptoms listed above.  Yesterday he noticed he was not making urine and decided to come to the ED for further evaluation.  Shortness of breath and dry cough.  Denies fevers and chills.  The ED he was hypertensive with blood pressure in the 200s.  He was also found to be hyperkalemic with K 5.1 and in acute renal failure with BUN 143 and creatinine of 19.  Chest x-ray also showed a left upper lobe and left base infiltrates consistent with pneumonia.   Medications: Outpatient medications: Medications Prior to Admission  Medication Sig Dispense Refill Last Dose  . escitalopram (LEXAPRO) 20 MG tablet Take 10 mg by mouth daily.    unknown  . lisinopril (PRINIVIL,ZESTRIL) 2.5 MG tablet Take 10 mg by mouth daily.    unknown  . Mycophenolate Sodium (MYCOPHENOLIC ACID PO) Take 1 tablet by mouth daily.   unknown  . tacrolimus (PROGRAF) 0.5 MG capsule Take 0.5 mg by mouth 2 (two) times daily.   unknown    Current medications: Current Facility-Administered Medications  Medication Dose Route Frequency Provider Last Rate Last Dose   . 0.9 %  sodium chloride infusion  250 mL Intravenous PRN Jani Gravel, MD 10 mL/hr at 01/30/18 0608 250 mL at 01/30/18 0608  . 0.9 %  sodium chloride infusion  100 mL Intravenous PRN Estanislado Emms, MD      . 0.9 %  sodium chloride infusion  100 mL Intravenous PRN Estanislado Emms, MD      . acetaminophen (TYLENOL) tablet 650 mg  650 mg Oral Q6H PRN Jani Gravel, MD       Or  . acetaminophen (TYLENOL) suppository 650 mg  650 mg Rectal Q6H PRN Jani Gravel, MD      . alteplase (CATHFLO ACTIVASE) injection 2 mg  2 mg Intracatheter Once PRN Estanislado Emms, MD      . amLODipine (NORVASC) tablet 5 mg  5 mg Oral Daily Jani Gravel, MD   5 mg at 01/30/18 0848  . azithromycin (ZITHROMAX) 500 mg in sodium chloride 0.9 % 250 mL IVPB  500 mg Intravenous Q24H Jani Gravel, MD   Stopped at 01/29/18 2356  . carvedilol (COREG) tablet 12.5 mg  12.5 mg Oral BID WC Jani Gravel,  MD   12.5 mg at 01/30/18 0849  . cefTRIAXone (ROCEPHIN) 1 g in sodium chloride 0.9 % 100 mL IVPB  1 g Intravenous Q24H Jani Gravel, MD      . Chlorhexidine Gluconate Cloth 2 % PADS 6 each  6 each Topical Q0600 Estanislado Emms, MD      . heparin injection 1,000 Units  1,000 Units Dialysis PRN Estanislado Emms, MD      . heparin injection 2,000 Units  20 Units/kg Dialysis PRN Estanislado Emms, MD      . heparin injection 5,000 Units  5,000 Units Subcutaneous Q8H Jani Gravel, MD   5,000 Units at 01/30/18 0600  . hydrALAZINE (APRESOLINE) injection 10 mg  10 mg Intravenous Q6H PRN Jani Gravel, MD   10 mg at 01/30/18 0559  . lidocaine (PF) (XYLOCAINE) 1 % injection 5 mL  5 mL Intradermal PRN Estanislado Emms, MD      . lidocaine-prilocaine (EMLA) cream 1 application  1 application Topical PRN Estanislado Emms, MD      . methylPREDNISolone sodium succinate (SOLU-MEDROL) 125 mg/2 mL injection 60 mg  60 mg Intravenous Q8H Estanislado Emms, MD      . ondansetron [facility]) injection 4 mg  4 mg Intravenous Q6H PRN Jani Gravel, MD   4 mg at 01/29/18 2249  .  pentafluoroprop-tetrafluoroeth (GEBAUERS) aerosol 1 application  1 application Topical PRN Estanislado Emms, MD      . sodium chloride flush (NS) 0.9 % injection 3 mL  3 mL Intravenous Q12H Jani Gravel, MD   3 mL at 01/30/18 0045  . sodium chloride flush (NS) 0.9 % injection 3 mL  3 mL Intravenous PRN Jani Gravel, MD          Allergies: No Known Allergies    Past Medical History: Past Medical History:  Diagnosis Date  . CKD (chronic kidney disease)   . Hypertension   . Renal disorder      Past Surgical History: Past Surgical History:  Procedure Laterality Date  . KIDNEY TRANSPLANT     at [facility]  . PARATHYROIDECTOMY     at [facility]     Family History: Family History  Problem Relation Age of Onset  . Heart failure Mother   . Kidney failure Father      Social History: Social History   Socioeconomic History  . Marital status: Single    Spouse name: Not on file  . Number of children: Not on file  . Years of education: Not on file  . Highest education level: Not on file  Occupational History  . Not on file  Social Needs  . Financial resource strain: Not on file  . Food insecurity:    Worry: Not on file    Inability: Not on file  . Transportation needs:    Medical: Not on file    Non-medical: Not on file  Tobacco Use  . Smoking status: Current Some Day Smoker    Types: Cigars  . Smokeless tobacco: Never Used  Substance and Sexual Activity  . Alcohol use: Yes    Comment: occasionally  . Drug use: Never  . Sexual activity: Not on file  Lifestyle  . Physical activity:    Days per week: Not on file    Minutes per session: Not on file  . Stress: Not on file  Relationships  . Social connections:    Talks on phone: Not on file    Gets together: Not  on file    Attends religious service: Not on file    Active member of club or organization: Not on file    Attends meetings of clubs or organizations: Not on file    Relationship status: Not on file  . Intimate partner  violence:    Fear of current or ex partner: Not on file    Emotionally abused: Not on file    Physically abused: Not on file    Forced sexual activity: Not on file  Other Topics Concern  . Not on file  Social History Narrative  . Not on file     Review of Systems: As per HPI  Vital Signs: Blood pressure 129/74, pulse 93, temperature 98.2 F (36.8 C), temperature source Oral, resp. rate 18, height 5\' 8"  (1.727 m), weight 225 lb 1.4 oz (102.1 kg), SpO2 99 %.  Weight trends: Filed Weights   01/29/18 2335 01/30/18 0536 01/30/18 1330  Weight: 224 lb 13.9 oz (102 kg) 224 lb 13.9 oz (102 kg) 225 lb 1.4 oz (102.1 kg)    Physical Exam: General: Vital signs reviewed and noted. Well-developed, well-nourished, male seen during HD in no acute distress; Tired and sleepy during encounter   Head: Normocephalic, atraumatic.  Eyes: PERRL, EOMI, No signs of anemia or jaundince.  Nose: Mucous membranes moist, not inflammed, nonerythematous.  Throat: Oropharynx nonerythematous, no exudate appreciated.   Neck: No deformities, masses, or tenderness noted.Supple, No carotid Bruits, no JVD.  Lungs:  Normal respiratory effort. Diffuse crackles throughout.   Heart: RRR. S1 and S2 normal without gallop, murmur, or rubs.  Abdomen:  BS normoactive. Soft, Nondistended, non-tender.  No masses or organomegaly.  Extremities: No pretibial edema bilaterally   Neurologic: A&O X3.  Able to move all 4 extremities spontaneously and purposefully.  No focal deficits noted  Skin: No visible rashes.  Right groin scar transplant site.    Lab results: Basic Metabolic Panel: Recent Labs  Lab 01/29/18 1705 01/30/18 0423 01/30/18 1347  NA 144 141 141  K 5.1 5.1 4.9  CL 107 108 109  CO2 14* 12* 14*  GLUCOSE 93 101* 105*  BUN 133* 143* 145*  CREATININE 19.32* 19.14*  19.14* 19.35*  CALCIUM 6.2* 5.8* 6.2*  MG  --  2.2  --   PHOS  --  8.7* 9.7*    Liver Function Tests: Recent Labs  Lab 01/29/18 1705  01/30/18 0423 01/30/18 1347  AST 35 33  --   ALT 11 11  --   ALKPHOS 82 70  --   BILITOT 0.7 0.6  --   PROT 8.2* 7.5  --   ALBUMIN 3.7 3.3* 3.0*   No results for input(s): LIPASE, AMYLASE in the last 168 hours. No results for input(s): AMMONIA in the last 168 hours.  CBC: Recent Labs  Lab 01/29/18 1705 01/30/18 0423  WBC 12.9* 13.5*  NEUTROABS 10.4*  --   HGB 10.7* 9.6*  HCT 31.0* 28.8*  MCV 87.8 89.2  PLT 295 267    Cardiac Enzymes: Recent Labs  Lab 01/29/18 1705 01/30/18 0423 01/30/18 1033  CKTOTAL 268  --   --   TROPONINI  --  0.12* 0.10*    BNP: Invalid input(s): POCBNP  CBG: No results for input(s): GLUCAP in the last 168 hours.  Microbiology: Results for orders placed or performed during the hospital encounter of 01/29/18  MRSA PCR Screening     Status: None   Collection Time: 01/29/18 11:47 PM  Result Value Ref  Range Status   MRSA by PCR NEGATIVE NEGATIVE Final    Comment:        The GeneXpert MRSA Assay (FDA approved for NASAL specimens only), is one component of a comprehensive MRSA colonization surveillance program. It is not intended to diagnose MRSA infection nor to guide or monitor treatment for MRSA infections. Performed at [facility], [place] [address] [place], [place] [address]     Coagulation Studies: No results for input(s): LABPROT, INR in the last 72 hours.  Urinalysis: No results for input(s): COLORURINE, LABSPEC, PHURINE, GLUCOSEU, HGBUR, BILIRUBINUR, KETONESUR, PROTEINUR, UROBILINOGEN, NITRITE, LEUKOCYTESUR in the last 72 hours.  Invalid input(s): APPERANCEUR    Imaging: Dg Chest 2 View  Result Date: 01/29/2018 CLINICAL DATA:  Shortness of breath and chest pain EXAM: CHEST - 2 VIEW COMPARISON:  None. FINDINGS: There is airspace consolidation in the left upper lobe, felt to represent pneumonia. There is more subtle consolidation in the left base. There is a right pleural effusion with fluid tracking into the right  minor fissure. There is cardiomegaly with pulmonary venous hypertension. No adenopathy. No evident bone lesion. IMPRESSION: Left upper lobe airspace consolidation, felt to represent pneumonia. Smaller area of consolidation left base. Right pleural effusion. Pulmonary vascular congestion. Electronically Signed   By: Lowella Grip III M.D.   On: 01/29/2018 14:04      Assessment & Plan: Pt is a 47 y.o. yo male with a PMHX of ESRD s/p renal transplant in 2014 at [facility], HTN, parathyroid hyperplasia s/p parathyroidectomy who presented with signs and symptoms of acute renal failure due to noncompliance with immunosuppressants.   1. Acute renal failure 2/2 non compliance with immunosuppressants for renal transplant:  - Presents with anuria, N/V, and decreased appetite after non compliance with MMF and Prograf x 3 months. Has history of ESRD 2/2 HTN  - BUN/Cr 133/19, unknown baseline but patient states normal 1 year ago. Care everywhere labs are from prior to renal transplant  - Phosphorus 8.7 - Started Solumedrol 60 mg q8h  - HD today and tomorrow  - Recommend not resuming MMF or Prograf  - Will need kidney biopsy next week to evaluate organ viability. Will benefit from resuming immunosuppressive agents if able to afford them and evidence of viability on biopsy  - Strict I/Os - Closely monitor renal function and electrolytes  - Agree with stopping lisinopril  - Will need to get established with nephrologist again   2. HTN: BP in the 200s on arrival to the ED. On lisinopril at home.  - BP improving, currently in the 160s - Agree with stopping lisinopril in the setting of renal failure - On amlodipine, carvedilol as well as hydralazine available as needed  3. Anemia: Hgb 10.7 on admission - Unknown baseline  - Continue to monitor   4. Hypocalcemia: Likely 2/2 to parathyroidectomy. Ca 6.2 -> 5.8 - PTH ordered  - Continue to replete  - Mag 2.2  5. CAP/R pleural effusion:  - LUL and L base  consolidation on CXR  - On Rocephin and azithromycin  - Bcxx2, U legionella and strep antigen  - Sputum gram stain

## 2018-01-30 NOTE — Procedures (Signed)
Stuck in old AVF without difficulty. Tolerating HD without instability.  Estanislado Emms, MD

## 2018-01-30 NOTE — Progress Notes (Signed)
Pt arrived to [facility]. Alert and oriented x 4, VS collected, elevated BP addressed. No signs of acute distress, denied chest pain. Pt identified appropriately.Cardiac monitor placed on pt, CCMD notified. Pt ambulated from stretcher to bed. Gait unsteady, need 1 person assistance with ambulation. Unclear safety order from ED, pt denied SI and HI, 1:1 sitter in room until clarification. Pt does not demonstrate any suicidal behavior, pleasant and cooperative. Significant other at the bedside. Pt oriented to room and equipment, instructed to call for assistance and call bell left with in reach. Will continue to monitor and treat pt per MD orders.

## 2018-01-30 NOTE — Progress Notes (Signed)
PROGRESS NOTE    Jerry [name]  DQQ:[id number] DOB: Nov 13, 1970 DOA: 01/29/2018 PCP: System, Pcp Not In    Brief Narrative:  47 y.o. male, w hx of ESRD s/p renal transplant, w normal creatinine previously , apparently had not been taking his transplant medication for the past 1 month.  Pt presents due blood in urine, decrease in appetite along with nausea and vomitting  and then has not made urine today .  Pt has slight congestion, slight dyspnea. + orthopnea.   Denies fever, chills, cp, palp.   In ED, nephrology consulted (Dr Justin Mend) who recommended transfer to [place]:   Principal Problem:   ARF (acute renal failure) ([place]) Active Problems:   Hypertensive urgency   Anemia   ARF -Have stopped Lisinopril  -Discussed with Nephrology. Plan to initiate hemodialysis -Admitted to not taking meds or immunosuppressive meds over the past several months at least -Plan to resume steroids with possibility for renal biopsy. Unlikley kidney will be salvaged -repeat bmet in AM  Hypertensive urgency -Have started Amlodipine 5mg  po qday -Continue on carvedilol 12.5mg  po bid -Will continue on Hydralazine 10mg  iv q6h prn sbp >160  Troponin elevation -mild trop leak noted. No chest pains -LIkley trop leak in setting of end-stage level renal failure  ESRD s/p transplant -Nephrology has recommended resuming steroids  Anemia -No evidence of acute blood loss. Labs reviewed -repeat cbc in AM  Hypocalcemia -given replacement -Repeat level in AM  CAP/ right pleural effusion -Blood culture x2 requested -ordered sputum gram stain , culture, pending -Urine legionella, antigen -Continue on empiric rocephin an azithromycin   DVT prophylaxis: Heparin subq Code Status: Full Family Communication: Pt in room, family at bedside Disposition Plan: Uncertain at this time  Consultants:   Nephrology  Procedures:     Antimicrobials: Anti-infectives  (From admission, onward)   Start     Dose/Rate Route Frequency Ordered Stop   01/30/18 1900  cefTRIAXone (ROCEPHIN) 1 g in sodium chloride 0.9 % 100 mL IVPB     1 g 200 mL/hr over 30 Minutes Intravenous Every 24 hours 01/29/18 2149     01/29/18 2200  azithromycin (ZITHROMAX) 500 mg in sodium chloride 0.9 % 250 mL IVPB     500 mg 250 mL/hr over 60 Minutes Intravenous Every 24 hours 01/29/18 2149     01/29/18 1900  vancomycin (VANCOCIN) 2,500 mg in sodium chloride 0.9 % 500 mL IVPB  Status:  Discontinued     2,500 mg 250 mL/hr over 120 Minutes Intravenous  Once 01/29/18 1810 01/29/18 1844   01/29/18 1830  vancomycin (VANCOCIN) 2,500 mg in sodium chloride 0.9 % 500 mL IVPB  Status:  Discontinued     2,500 mg 250 mL/hr over 120 Minutes Intravenous  Once 01/29/18 1809 01/29/18 1810   01/29/18 1830  ceFEPIme (MAXIPIME) 2 g in sodium chloride 0.9 % 100 mL IVPB     2 g 200 mL/hr over 30 Minutes Intravenous  Once 01/29/18 1809 01/29/18 1947       Subjective: No chest pains  Objective: Vitals:   01/30/18 1415 01/30/18 1445 01/30/18 1515 01/30/18 1545  BP: 131/78 129/74 126/75 103/76  Pulse: 91 93 96 97  Resp: 18 18 17 16   Temp:      TempSrc:      SpO2:    100%  Weight:      Height:        Intake/Output Summary (Last 24 hours) at 01/30/2018 1706 Last data  filed at 01/30/2018 1040 Gross per 24 hour  Intake 1921.67 ml  Output -  Net 1921.67 ml   Filed Weights   01/29/18 2335 01/30/18 0536 01/30/18 1330  Weight: 102 kg (224 lb 13.9 oz) 102 kg (224 lb 13.9 oz) 102.1 kg (225 lb 1.4 oz)    Examination:  General exam: Appears calm and comfortable  Respiratory system: Clear to auscultation. Respiratory effort normal. Cardiovascular system: S1 & S2 heard, RRR. Gastrointestinal system: Abdomen is nondistended, soft and nontender. No organomegaly or masses felt. Normal bowel sounds heard. Central nervous system: Alert and oriented. No focal neurological deficits. Extremities:  Symmetric 5 x 5 power. Skin: No rashes, lesions  Psychiatry: Judgement and insight appear normal. Mood & affect appropriate.   Data Reviewed: I have personally reviewed following labs and imaging studies  CBC: Recent Labs  Lab 01/29/18 1705 01/30/18 0423  WBC 12.9* 13.5*  NEUTROABS 10.4*  --   HGB 10.7* 9.6*  HCT 31.0* 28.8*  MCV 87.8 89.2  PLT 295 268   Basic Metabolic Panel: Recent Labs  Lab 01/29/18 1705 01/30/18 0423 01/30/18 1347  NA 144 141 141  K 5.1 5.1 4.9  CL 107 108 109  CO2 14* 12* 14*  GLUCOSE 93 101* 105*  BUN 133* 143* 145*  CREATININE 19.32* 19.14*  19.14* 19.35*  CALCIUM 6.2* 5.8* 6.2*  MG  --  2.2  --   PHOS  --  8.7* 9.7*   GFR: Estimated Creatinine Clearance: 5.5 mL/min (A) (by C-G formula based on SCr of 19.35 mg/dL (H)). Liver Function Tests: Recent Labs  Lab 01/29/18 1705 01/30/18 0423 01/30/18 1347  AST 35 33  --   ALT 11 11  --   ALKPHOS 82 70  --   BILITOT 0.7 0.6  --   PROT 8.2* 7.5  --   ALBUMIN 3.7 3.3* 3.0*   No results for input(s): LIPASE, AMYLASE in the last 168 hours. No results for input(s): AMMONIA in the last 168 hours. Coagulation Profile: No results for input(s): INR, PROTIME in the last 168 hours. Cardiac Enzymes: Recent Labs  Lab 01/29/18 1705 01/30/18 0423 01/30/18 1033  CKTOTAL 268  --   --   TROPONINI  --  0.12* 0.10*   BNP (last 3 results) No results for input(s): PROBNP in the last 8760 hours. HbA1C: No results for input(s): HGBA1C in the last 72 hours. CBG: No results for input(s): GLUCAP in the last 168 hours. Lipid Profile: No results for input(s): CHOL, HDL, LDLCALC, TRIG, CHOLHDL, LDLDIRECT in the last 72 hours. Thyroid Function Tests: No results for input(s): TSH, T4TOTAL, FREET4, T3FREE, THYROIDAB in the last 72 hours. Anemia Panel: No results for input(s): VITAMINB12, FOLATE, FERRITIN, TIBC, IRON, RETICCTPCT in the last 72 hours. Sepsis Labs: No results for input(s): PROCALCITON,  LATICACIDVEN in the last 168 hours.  Recent Results (from the past 240 hour(s))  MRSA PCR Screening     Status: None   Collection Time: 01/29/18 11:47 PM  Result Value Ref Range Status   MRSA by PCR NEGATIVE NEGATIVE Final    Comment:        The GeneXpert MRSA Assay (FDA approved for NASAL specimens only), is one component of a comprehensive MRSA colonization surveillance program. It is not intended to diagnose MRSA infection nor to guide or monitor treatment for MRSA infections. Performed at [facility], [place] [address], [place], [place] [address]      Radiology Studies: Dg Chest 2 View  Result Date: 01/29/2018 CLINICAL DATA:  Shortness of breath and chest pain EXAM: CHEST - 2 VIEW COMPARISON:  None. FINDINGS: There is airspace consolidation in the left upper lobe, felt to represent pneumonia. There is more subtle consolidation in the left base. There is a right pleural effusion with fluid tracking into the right minor fissure. There is cardiomegaly with pulmonary venous hypertension. No adenopathy. No evident bone lesion. IMPRESSION: Left upper lobe airspace consolidation, felt to represent pneumonia. Smaller area of consolidation left base. Right pleural effusion. Pulmonary vascular congestion. Electronically Signed   By: Lowella Grip III M.D.   On: 01/29/2018 14:04    Scheduled Meds: . amLODipine  5 mg Oral Daily  . carvedilol  12.5 mg Oral BID WC  . Chlorhexidine Gluconate Cloth  6 each Topical Q0600  . heparin  5,000 Units Subcutaneous Q8H  . methylPREDNISolone (SOLU-MEDROL) injection  60 mg Intravenous Q8H  . sodium chloride flush  3 mL Intravenous Q12H   Continuous Infusions: . sodium chloride 250 mL (01/30/18 8115)  . sodium chloride    . sodium chloride    . azithromycin Stopped (01/29/18 2356)  . cefTRIAXone (ROCEPHIN)  IV       LOS: 1 day   Marylu Lund, MD [facility] Pager [phone]  If 7PM-7AM, please contact  night-coverage [email or web address] Password [email or web address] 01/30/2018, 5:06 PM

## 2018-01-30 NOTE — Progress Notes (Signed)
Tele sitter monitoring initiated.

## 2018-01-30 NOTE — Progress Notes (Signed)
Troponin critical at 0.12 previous value 0.14.Schorr, NP notified.

## 2018-01-31 LAB — HEPATITIS B SURFACE ANTIBODY,QUALITATIVE: HEP B S AB: REACTIVE

## 2018-01-31 LAB — BASIC METABOLIC PANEL
Anion gap: 17 — ABNORMAL HIGH (ref 5–15)
BUN: 82 mg/dL — ABNORMAL HIGH (ref 6–20)
CALCIUM: 6.8 mg/dL — AB (ref 8.9–10.3)
CO2: 20 mmol/L — AB (ref 22–32)
Chloride: 102 mmol/L (ref 98–111)
Creatinine, Ser: 13.46 mg/dL — ABNORMAL HIGH (ref 0.61–1.24)
GFR calc non Af Amer: 4 mL/min — ABNORMAL LOW (ref >60)
GFR, EST AFRICAN AMERICAN: 4 mL/min — AB (ref >60)
Glucose, Bld: 139 mg/dL — ABNORMAL HIGH (ref 70–99)
Potassium: 4.4 mmol/L (ref 3.5–5.1)
SODIUM: 139 mmol/L (ref 135–145)

## 2018-01-31 LAB — CALCIUM, IONIZED

## 2018-01-31 LAB — HEPATITIS B CORE ANTIBODY, TOTAL: Hep B Core Total Ab: NEGATIVE

## 2018-01-31 LAB — PARATHYROID HORMONE, INTACT (NO CA): PTH: 982 pg/mL — AB (ref 15–65)

## 2018-01-31 LAB — HEPATITIS B SURFACE ANTIGEN: Hepatitis B Surface Ag: NEGATIVE

## 2018-01-31 MED ORDER — RENA-VITE PO TABS
1.0000 | ORAL_TABLET | Freq: Every day | ORAL | Status: DC
  Administered 2018-01-31 – 2018-02-03 (×4): 1 via ORAL
  Filled 2018-01-31 (×4): qty 1

## 2018-01-31 MED ORDER — SODIUM CHLORIDE 0.9 % IV SOLN
INTRAVENOUS | Status: DC
  Administered 2018-01-31 – 2018-02-02 (×2): via INTRAVENOUS

## 2018-01-31 MED ORDER — CHLORHEXIDINE GLUCONATE CLOTH 2 % EX PADS
6.0000 | MEDICATED_PAD | Freq: Every day | CUTANEOUS | Status: DC
  Administered 2018-01-31 – 2018-02-01 (×2): 6 via TOPICAL

## 2018-01-31 MED ORDER — CALCIUM ACETATE (PHOS BINDER) 667 MG PO CAPS
2001.0000 mg | ORAL_CAPSULE | Freq: Three times a day (TID) | ORAL | Status: DC
  Administered 2018-01-31 – 2018-02-04 (×11): 2001 mg via ORAL
  Filled 2018-01-31 (×11): qty 3

## 2018-01-31 NOTE — Progress Notes (Signed)
Assessment & Plan: Pt is a 47 y.o. yo male with a PMHX of ESRD s/p renal transplant in 2014 at [facility], HTN, parathyroid hyperplasia s/p parathyroidectomy who presented with signs and symptoms of acute renal failure due to nonadherence with immunosuppressants.   1. Acute renal failure 2/2 non compliance with immunosuppressants for renal transplant: on IV solumedrol.  This is probably end stage renal disease, but discussed possible renal biopsy to see if anything salvageable.  HD again Friday. 2. HTN: Improving 3. Anemia:  4. Hyperparathyroidism/hyper phos: Begin calcium acetate 5. Volume overload: improved post HD  6. AVF RUE: functioning  Subjective: Interval History: feels better  Objective: Vital signs in last 24 hours: Temp:  [97.7 F (36.5 C)-99.2 F (37.3 C)] 97.7 F (36.5 C) (07/18 0543) Pulse Rate:  [69-102] 78 (07/18 0812) Resp:  [16-18] 17 (07/18 0542) BP: (103-169)/(74-102) 169/102 (07/18 0812) SpO2:  [97 %-100 %] 97 % (07/18 0543) Weight:  [99.8 kg (220 lb)-102.1 kg (225 lb 1.4 oz)] 99.8 kg (220 lb) (07/18 0500) Weight change: 0.63 kg (1 lb 6.2 oz)  Intake/Output from previous day: 07/17 0701 - 07/18 0700 In: 120 [P.O.:120] Out: 75 [Urine:75] Intake/Output this shift: No intake/output data recorded.  General appearance: alert and cooperative Resp: diminished breath sounds bibasilar Chest wall: no tenderness Cardio: regular rate and rhythm, S1, S2 normal, no murmur, click, rub or gallop Extremities: edema 1+  Abdomen renal allograft RLQ nontender  Lab Results: Recent Labs    01/29/18 1705 01/30/18 0423  WBC 12.9* 13.5*  HGB 10.7* 9.6*  HCT 31.0* 28.8*  PLT 295 267   BMET:  Recent Labs    01/30/18 1347 01/31/18 0515  NA 141 139  K 4.9 4.4  CL 109 102  CO2 14* 20*  GLUCOSE 105* 139*  BUN 145* 82*  CREATININE 19.35* 13.46*  CALCIUM 6.2* 6.8*   Recent Labs    01/30/18 0423  PTH 982*   Iron Studies: No results for input(s): IRON, TIBC,  TRANSFERRIN, FERRITIN in the last 72 hours. Studies/Results: Dg Chest 2 View  Result Date: 01/29/2018 CLINICAL DATA:  Shortness of breath and chest pain EXAM: CHEST - 2 VIEW COMPARISON:  None. FINDINGS: There is airspace consolidation in the left upper lobe, felt to represent pneumonia. There is more subtle consolidation in the left base. There is a right pleural effusion with fluid tracking into the right minor fissure. There is cardiomegaly with pulmonary venous hypertension. No adenopathy. No evident bone lesion. IMPRESSION: Left upper lobe airspace consolidation, felt to represent pneumonia. Smaller area of consolidation left base. Right pleural effusion. Pulmonary vascular congestion. Electronically Signed   By: Lowella Grip III M.D.   On: 01/29/2018 14:04    Scheduled: . amLODipine  5 mg Oral Daily  . calcium acetate  2,001 mg Oral TID WC  . carvedilol  12.5 mg Oral BID WC  . Chlorhexidine Gluconate Cloth  6 each Topical Q0600  . Chlorhexidine Gluconate Cloth  6 each Topical Q0600  . feeding supplement (NEPRO CARB STEADY)  237 mL Oral BID BM  . heparin  5,000 Units Subcutaneous Q8H  . methylPREDNISolone (SOLU-MEDROL) injection  60 mg Intravenous Q8H  . sodium chloride flush  3 mL Intravenous Q12H      LOS: 2 days   Estanislado Emms 01/31/2018,10:05 AM

## 2018-01-31 NOTE — Progress Notes (Addendum)
PROGRESS NOTE    Jerry [name]  IOX:[id number] DOB: 1970/07/20 DOA: 01/29/2018 PCP: System, Pcp Not In    Brief Narrative:  47 y.o. male, w hx of ESRD s/p renal transplant, w normal creatinine previously , apparently had not been taking his transplant medication for the past 1 month.  Pt presents due blood in urine, decrease in appetite along with nausea and vomitting  and then has not made urine today .  Pt has slight congestion, slight dyspnea. + orthopnea.   Denies fever, chills, cp, palp.   In ED, nephrology consulted (Dr Justin Mend) who recommended transfer to [place]:   Principal Problem:   ARF (acute renal failure) ([place]) Active Problems:   Hypertensive urgency   Anemia   ARF -Have stopped Lisinopril  -Discussed with Nephrology. Plan to initiate hemodialysis -Admitted to not taking meds or immunosuppressive meds over the past several months at least -Plan to resume steroids with possibility for renal biopsy. Unlikley kidney will be salvaged -cr has improved slightly. Labs reviewed. Repeat BMET in AM -Nephrology planning HD session tomorrow  Hypertensive urgency -Have started Amlodipine 5mg  po qday -Continue on carvedilol 12.5mg  po bid -Will continue on Hydralazine 10mg  iv q6h prn sbp >160 -Stable at present  Troponin elevation -mild trop leak suspected -LIkley trop leak in setting of end-stage level renal failure -No chest pains  ESRD s/p transplant -Nephrology has recommended resuming steroids per above -Currently on HD, suspicion for end stage kidney disease  Anemia -No evidence of acute blood loss. Labs reviewed -continue to follow CBC trends  Hypocalcemia -given replacement -continue to follow electrolytes  CAP/ right pleural effusion -Blood culture x2 requested -ordered sputum gram stain , culture, pending -Urine legionella, antigen -Patient is continued on empiric rocephin an azithromycin  Grade 2 diastolic CHF,  chronicity unknown, stable  DVT prophylaxis: Heparin subq Code Status: Full Family Communication: Pt in room, family at bedside Disposition Plan: Uncertain at this time  Consultants:   Nephrology  Procedures:     Antimicrobials: Anti-infectives (From admission, onward)   Start     Dose/Rate Route Frequency Ordered Stop   01/30/18 1900  cefTRIAXone (ROCEPHIN) 1 g in sodium chloride 0.9 % 100 mL IVPB     1 g 200 mL/hr over 30 Minutes Intravenous Every 24 hours 01/29/18 2149     01/29/18 2200  azithromycin (ZITHROMAX) 500 mg in sodium chloride 0.9 % 250 mL IVPB     500 mg 250 mL/hr over 60 Minutes Intravenous Every 24 hours 01/29/18 2149     01/29/18 1900  vancomycin (VANCOCIN) 2,500 mg in sodium chloride 0.9 % 500 mL IVPB  Status:  Discontinued     2,500 mg 250 mL/hr over 120 Minutes Intravenous  Once 01/29/18 1810 01/29/18 1844   01/29/18 1830  vancomycin (VANCOCIN) 2,500 mg in sodium chloride 0.9 % 500 mL IVPB  Status:  Discontinued     2,500 mg 250 mL/hr over 120 Minutes Intravenous  Once 01/29/18 1809 01/29/18 1810   01/29/18 1830  ceFEPIme (MAXIPIME) 2 g in sodium chloride 0.9 % 100 mL IVPB     2 g 200 mL/hr over 30 Minutes Intravenous  Once 01/29/18 1809 01/29/18 1947      Subjective: Feeling better. In good spirits  Objective: Vitals:   01/31/18 0622 01/31/18 0812 01/31/18 1009 01/31/18 1453  BP: (!) 145/95 (!) 169/102 (!) 148/90 (!) 158/94  Pulse: 80 78  81  Resp:    18  Temp:  TempSrc:      SpO2:    99%  Weight:      Height:        Intake/Output Summary (Last 24 hours) at 01/31/2018 1637 Last data filed at 01/31/2018 1300 Gross per 24 hour  Intake 240 ml  Output 75 ml  Net 165 ml   Filed Weights   01/30/18 0536 01/30/18 1330 01/31/18 0500  Weight: 102 kg (224 lb 13.9 oz) 102.1 kg (225 lb 1.4 oz) 99.8 kg (220 lb)    Examination: General exam: Awake, laying in bed, in nad Respiratory system: Normal respiratory effort, no  wheezing Cardiovascular system: regular rate, s1, s2 Gastrointestinal system: Soft, nondistended, positive BS Central nervous system: CN2-12 grossly intact, strength intact Extremities: Perfused, no clubbing Skin: Normal skin turgor, no notable skin lesions seen Psychiatry: Mood normal // no visual hallucinations   Data Reviewed: I have personally reviewed following labs and imaging studies  CBC: Recent Labs  Lab 01/29/18 1705 01/30/18 0423  WBC 12.9* 13.5*  NEUTROABS 10.4*  --   HGB 10.7* 9.6*  HCT 31.0* 28.8*  MCV 87.8 89.2  PLT 295 263   Basic Metabolic Panel: Recent Labs  Lab 01/29/18 1705 01/30/18 0423 01/30/18 1347 01/31/18 0515  NA 144 141 141 139  K 5.1 5.1 4.9 4.4  CL 107 108 109 102  CO2 14* 12* 14* 20*  GLUCOSE 93 101* 105* 139*  BUN 133* 143* 145* 82*  CREATININE 19.32* 19.14*  19.14* 19.35* 13.46*  CALCIUM 6.2* 5.8* 6.2* 6.8*  MG  --  2.2  --   --   PHOS  --  8.7* 9.7*  --    GFR: Estimated Creatinine Clearance: 7.8 mL/min (A) (by C-G formula based on SCr of 13.46 mg/dL (H)). Liver Function Tests: Recent Labs  Lab 01/29/18 1705 01/30/18 0423 01/30/18 1347  AST 35 33  --   ALT 11 11  --   ALKPHOS 82 70  --   BILITOT 0.7 0.6  --   PROT 8.2* 7.5  --   ALBUMIN 3.7 3.3* 3.0*   No results for input(s): LIPASE, AMYLASE in the last 168 hours. No results for input(s): AMMONIA in the last 168 hours. Coagulation Profile: No results for input(s): INR, PROTIME in the last 168 hours. Cardiac Enzymes: Recent Labs  Lab 01/29/18 1705 01/30/18 0423 01/30/18 1033 01/30/18 1741  CKTOTAL 268  --   --   --   TROPONINI  --  0.12* 0.10* 0.08*   BNP (last 3 results) No results for input(s): PROBNP in the last 8760 hours. HbA1C: No results for input(s): HGBA1C in the last 72 hours. CBG: Recent Labs  Lab 01/30/18 2042  GLUCAP 135*   Lipid Profile: No results for input(s): CHOL, HDL, LDLCALC, TRIG, CHOLHDL, LDLDIRECT in the last 72 hours. Thyroid  Function Tests: No results for input(s): TSH, T4TOTAL, FREET4, T3FREE, THYROIDAB in the last 72 hours. Anemia Panel: No results for input(s): VITAMINB12, FOLATE, FERRITIN, TIBC, IRON, RETICCTPCT in the last 72 hours. Sepsis Labs: No results for input(s): PROCALCITON, LATICACIDVEN in the last 168 hours.  Recent Results (from the past 240 hour(s))  MRSA PCR Screening     Status: None   Collection Time: 01/29/18 11:47 PM  Result Value Ref Range Status   MRSA by PCR NEGATIVE NEGATIVE Final    Comment:        The GeneXpert MRSA Assay (FDA approved for NASAL specimens only), is one component of a comprehensive MRSA colonization surveillance program.  It is not intended to diagnose MRSA infection nor to guide or monitor treatment for MRSA infections. Performed at [facility], [place] [address], [place], [place] [address]      Radiology Studies: No results found.  Scheduled Meds: . amLODipine  5 mg Oral Daily  . calcium acetate  2,001 mg Oral TID WC  . carvedilol  12.5 mg Oral BID WC  . Chlorhexidine Gluconate Cloth  6 each Topical Q0600  . Chlorhexidine Gluconate Cloth  6 each Topical Q0600  . feeding supplement (NEPRO CARB STEADY)  237 mL Oral BID BM  . heparin  5,000 Units Subcutaneous Q8H  . methylPREDNISolone (SOLU-MEDROL) injection  60 mg Intravenous Q8H  . sodium chloride flush  3 mL Intravenous Q12H   Continuous Infusions: . sodium chloride 250 mL (01/30/18 2244)  . azithromycin 500 mg (01/30/18 2155)  . cefTRIAXone (ROCEPHIN)  IV 1 g (01/30/18 2045)     LOS: 2 days   Marylu Lund, MD [facility] Pager [phone]  If 7PM-7AM, please contact night-coverage [email or web address] Password [facility] 01/31/2018, 4:37 PM

## 2018-01-31 NOTE — Progress Notes (Signed)
Initial Nutrition Assessment  DOCUMENTATION CODES:   Obesity unspecified  INTERVENTION:   - Continue Nepro Shake po BID, each supplement provides 425 kcal and 19 grams protein (vanilla or butter pecan)  - Renal MVI daily  - Ordered double protein portions at lunch and dinner meals  NUTRITION DIAGNOSIS:   Increased nutrient needs related to chronic illness(likely new ESRD on HD) as evidenced by estimated needs.  GOAL:   Patient will meet greater than or equal to 90% of their needs  MONITOR:   PO intake, Supplement acceptance, Weight trends, I & O's, Labs  REASON FOR ASSESSMENT:   Malnutrition Screening Tool    ASSESSMENT:   47 year old male who presented to the ED with multiple complaints. PMH significant for hypertension and ESRD s/p kidney transplant 5 years ago. Pt with history of medical noncompliance.  Spoke with pt and significant other at bedside. Pt reports being very hungry and looking forward to dinner. RD provided pt with a vanilla Nepro supplement.  Pt states that he did not have much of an appetite and could only eat milkshakes over the past "couple weeks" but that his appetite is now returning. Pt states he eats "for sustenance and for flavor."  Pt endorses weight loss but is unsure how much as he does not weigh himself.  Pt states he is familiar with the renal diet. Pt has request double protein with meals.  Medications reviewed and include: Phoslo TID with meals, Nepro BID  Labs reviewed: CO2 20 (L), BUN 82 (H), creatinine 13.46 (H), calcium 6.8 (L), phosphorus 9.7 (H)  I/O's: +2 L since admission  NUTRITION - FOCUSED PHYSICAL EXAM:  No evidence of subcutaneous fat or muscle depletion.  Diet Order:   Diet Order           Diet renal with fluid restriction Fluid restriction: 1200 mL Fluid; Room service appropriate? Yes; Fluid consistency: Thin  Diet effective now          EDUCATION NEEDS:   No education needs have been identified at this  time  Skin:  Skin Assessment: Reviewed RN Assessment  Last BM:  01/31/18  Height:   Ht Readings from Last 1 Encounters:  01/29/18 5\' 8"  (1.727 m)    Weight:   Wt Readings from Last 1 Encounters:  01/31/18 220 lb (99.8 kg)    Ideal Body Weight:  70 kg  BMI:  Body mass index is 33.45 kg/m.  Estimated Nutritional Needs:   Kcal:  2200-2400 kcal/day  Protein:  120-135 grams/day  Fluid:  1000 ml + UOP    Gaynell Face, MS, RD, LDN Pager: [phone] Weekend/After Hours: [phone]

## 2018-01-31 NOTE — Progress Notes (Signed)
Patient reported feeling "weird" after dialysis this afternoon. RN explained to pt that this can happen after the procedure. RN asked pt to call her if symptoms increased. Will continue to monitor

## 2018-02-01 LAB — CBC
HCT: 28.8 % — ABNORMAL LOW (ref 39.0–52.0)
Hemoglobin: 9.6 g/dL — ABNORMAL LOW (ref 13.0–17.0)
MCH: 29.6 pg (ref 26.0–34.0)
MCHC: 33.3 g/dL (ref 30.0–36.0)
MCV: 88.9 fL (ref 78.0–100.0)
PLATELETS: 303 10*3/uL (ref 150–400)
RBC: 3.24 MIL/uL — AB (ref 4.22–5.81)
RDW: 14.8 % (ref 11.5–15.5)
WBC: 16 10*3/uL — ABNORMAL HIGH (ref 4.0–10.5)

## 2018-02-01 LAB — BASIC METABOLIC PANEL
Anion gap: 18 — ABNORMAL HIGH (ref 5–15)
BUN: 111 mg/dL — AB (ref 6–20)
CALCIUM: 6.8 mg/dL — AB (ref 8.9–10.3)
CHLORIDE: 100 mmol/L (ref 98–111)
CO2: 19 mmol/L — ABNORMAL LOW (ref 22–32)
CREATININE: 14.99 mg/dL — AB (ref 0.61–1.24)
GFR calc non Af Amer: 3 mL/min — ABNORMAL LOW (ref >60)
GFR, EST AFRICAN AMERICAN: 4 mL/min — AB (ref >60)
GLUCOSE: 134 mg/dL — AB (ref 70–99)
Potassium: 4.5 mmol/L (ref 3.5–5.1)
Sodium: 137 mmol/L (ref 135–145)

## 2018-02-01 MED ORDER — CHLORHEXIDINE GLUCONATE CLOTH 2 % EX PADS
6.0000 | MEDICATED_PAD | Freq: Every day | CUTANEOUS | Status: DC
  Administered 2018-02-01: 6 via TOPICAL

## 2018-02-01 MED ORDER — CHLORHEXIDINE GLUCONATE CLOTH 2 % EX PADS: 6.0000 | MEDICATED_PAD | Freq: Every day | CUTANEOUS | Status: DC

## 2018-02-01 MED ORDER — ESCITALOPRAM OXALATE 10 MG PO TABS
10.0000 mg | ORAL_TABLET | Freq: Every day | ORAL | Status: DC
  Administered 2018-02-01 – 2018-02-04 (×4): 10 mg via ORAL
  Filled 2018-02-01 (×4): qty 1

## 2018-02-01 MED ORDER — PREDNISONE 50 MG PO TABS
60.0000 mg | ORAL_TABLET | Freq: Every day | ORAL | Status: DC
  Administered 2018-02-02: 60 mg via ORAL
  Filled 2018-02-01: qty 1

## 2018-02-01 NOTE — Procedures (Signed)
Tolerating HD #2, no hemodynamic instability.  Using AVF. Erling Cruz, MD

## 2018-02-01 NOTE — Progress Notes (Signed)
Long conversation with Jerry [name] as we process all that he feels needs changing in his life.  He hopes to be given another chance.  Ministry of presence, prayer and compassionate listening.  He is happy to be going home tomorrow and hopes he can continue to improve.    02/01/18 1738  Clinical Encounter Type  Visited With Patient  Visit Type Initial;Spiritual support  Spiritual Encounters  Spiritual Needs Prayer

## 2018-02-01 NOTE — Progress Notes (Signed)
Patient's blood pressure was elevated. He wanted it to be retaken because he believed it was incorrect (due to stress). RN told patient that tech would be back. Pt called RN and started complaining that this IV site was hurting. IV was taken out and changed. Pt then stated that he was in extreme pain and needed some medication- pt postponed vital recheck at that time. When RN returned to check vitals, pt was still not ready (stating that he was eager and could not get comfortable). Tech has been asked to take vitals at this time for pt was educated that a recheck was needed.

## 2018-02-01 NOTE — Progress Notes (Signed)
PROGRESS NOTE    Jerry [name]  MVE:[id number] DOB: 07/02/1971 DOA: 01/29/2018 PCP: System, Pcp Not In    Brief Narrative:  47 y.o. male, w hx of ESRD s/p renal transplant, w normal creatinine previously , apparently had not been taking his transplant medication for the past 1 month.  Pt presents due blood in urine, decrease in appetite along with nausea and vomitting  and then has not made urine today .  Pt has slight congestion, slight dyspnea. + orthopnea.   Denies fever, chills, cp, palp.   In ED, nephrology consulted (Dr Justin Mend) who recommended transfer to [place]:   Principal Problem:   ARF (acute renal failure) ([place]) Active Problems:   Hypertensive urgency   Anemia   ARF -Have stopped Lisinopril  -Discussed with Nephrology. Plan to initiate hemodialysis -Admitted to not taking meds or immunosuppressive meds over the past several months at least -Plan to resume steroids with possibility for renal biopsy. Unlikley kidney will be salvaged -cr has improved slightly. Labs reviewed. Repeat BMET in AM -Nephrology continuing with HD as tolerated  Hypertensive urgency -Have started Amlodipine 5mg  po qday -Continue on carvedilol 12.5mg  po bid -Will continue on Hydralazine 10mg  iv q6h prn sbp >160 -Stable at present  Troponin elevation -mild trop leak suspected -LIkley trop leak in setting of end-stage level renal failure -No chest pains. Will monitor  ESRD s/p transplant -Nephrology has recommended resuming steroids per above -Currently on HD, suspicion for end stage kidney disease  Anemia -No evidence of acute blood loss. Labs reviewed -continue to follow CBC trends  Hypocalcemia -given replacement -continue to follow electrolytes  CAP/ right pleural effusion -Blood culture x2 requested -ordered sputum gram stain , culture, pending -Urine legionella, antigen -Patient is continued on empiric rocephin an azithromycin  Grade  2 diastolic CHF, chronicity unknown, stable  DVT prophylaxis: Heparin subq Code Status: Full Family Communication: Pt in room, family at bedside Disposition Plan: Uncertain at this time  Consultants:   Nephrology  Procedures:     Antimicrobials: Anti-infectives (From admission, onward)   Start     Dose/Rate Route Frequency Ordered Stop   01/30/18 1900  cefTRIAXone (ROCEPHIN) 1 g in sodium chloride 0.9 % 100 mL IVPB     1 g 200 mL/hr over 30 Minutes Intravenous Every 24 hours 01/29/18 2149     01/29/18 2200  azithromycin (ZITHROMAX) 500 mg in sodium chloride 0.9 % 250 mL IVPB     500 mg 250 mL/hr over 60 Minutes Intravenous Every 24 hours 01/29/18 2149     01/29/18 1900  vancomycin (VANCOCIN) 2,500 mg in sodium chloride 0.9 % 500 mL IVPB  Status:  Discontinued     2,500 mg 250 mL/hr over 120 Minutes Intravenous  Once 01/29/18 1810 01/29/18 1844   01/29/18 1830  vancomycin (VANCOCIN) 2,500 mg in sodium chloride 0.9 % 500 mL IVPB  Status:  Discontinued     2,500 mg 250 mL/hr over 120 Minutes Intravenous  Once 01/29/18 1809 01/29/18 1810   01/29/18 1830  ceFEPIme (MAXIPIME) 2 g in sodium chloride 0.9 % 100 mL IVPB     2 g 200 mL/hr over 30 Minutes Intravenous  Once 01/29/18 1809 01/29/18 1947      Subjective: Without complaints. Eager to go home soon  Objective: Vitals:   02/01/18 1030 02/01/18 1100 02/01/18 1134 02/01/18 1536  BP: (!) 163/100 (!) 155/98 (!) 157/97 (!) 148/87  Pulse: 91 95 99 95  Resp: 20 20  18 16  Temp:   98.5 F (36.9 C) 99.1 F (37.3 C)  TempSrc:   Oral Oral  SpO2:   97% 97%  Weight:   98.9 kg (218 lb 0.6 oz)   Height:        Intake/Output Summary (Last 24 hours) at 02/01/2018 1745 Last data filed at 02/01/2018 1503 Gross per 24 hour  Intake 352.94 ml  Output 3000 ml  Net -2647.06 ml   Filed Weights   02/01/18 0450 02/01/18 0755 02/01/18 1134  Weight: 103.4 kg (227 lb 15.3 oz) 101.9 kg (224 lb 10.4 oz) 98.9 kg (218 lb 0.6 oz)     Examination: General exam: Conversant, in no acute distress Respiratory system: normal chest rise, clear, no audible wheezing Cardiovascular system: regular rhythm, s1-s2  Data Reviewed: I have personally reviewed following labs and imaging studies  CBC: Recent Labs  Lab 01/29/18 1705 01/30/18 0423 02/01/18 0440  WBC 12.9* 13.5* 16.0*  NEUTROABS 10.4*  --   --   HGB 10.7* 9.6* 9.6*  HCT 31.0* 28.8* 28.8*  MCV 87.8 89.2 88.9  PLT 295 267 875   Basic Metabolic Panel: Recent Labs  Lab 01/29/18 1705 01/30/18 0423 01/30/18 1347 01/31/18 0515 02/01/18 0440  NA 144 141 141 139 137  K 5.1 5.1 4.9 4.4 4.5  CL 107 108 109 102 100  CO2 14* 12* 14* 20* 19*  GLUCOSE 93 101* 105* 139* 134*  BUN 133* 143* 145* 82* 111*  CREATININE 19.32* 19.14*  19.14* 19.35* 13.46* 14.99*  CALCIUM 6.2* 5.8* 6.2* 6.8* 6.8*  MG  --  2.2  --   --   --   PHOS  --  8.7* 9.7*  --   --    GFR: Estimated Creatinine Clearance: 6.9 mL/min (A) (by C-G formula based on SCr of 14.99 mg/dL (H)). Liver Function Tests: Recent Labs  Lab 01/29/18 1705 01/30/18 0423 01/30/18 1347  AST 35 33  --   ALT 11 11  --   ALKPHOS 82 70  --   BILITOT 0.7 0.6  --   PROT 8.2* 7.5  --   ALBUMIN 3.7 3.3* 3.0*   No results for input(s): LIPASE, AMYLASE in the last 168 hours. No results for input(s): AMMONIA in the last 168 hours. Coagulation Profile: No results for input(s): INR, PROTIME in the last 168 hours. Cardiac Enzymes: Recent Labs  Lab 01/29/18 1705 01/30/18 0423 01/30/18 1033 01/30/18 1741  CKTOTAL 268  --   --   --   TROPONINI  --  0.12* 0.10* 0.08*   BNP (last 3 results) No results for input(s): PROBNP in the last 8760 hours. HbA1C: No results for input(s): HGBA1C in the last 72 hours. CBG: Recent Labs  Lab 01/30/18 2042  GLUCAP 135*   Lipid Profile: No results for input(s): CHOL, HDL, LDLCALC, TRIG, CHOLHDL, LDLDIRECT in the last 72 hours. Thyroid Function Tests: No results for  input(s): TSH, T4TOTAL, FREET4, T3FREE, THYROIDAB in the last 72 hours. Anemia Panel: No results for input(s): VITAMINB12, FOLATE, FERRITIN, TIBC, IRON, RETICCTPCT in the last 72 hours. Sepsis Labs: No results for input(s): PROCALCITON, LATICACIDVEN in the last 168 hours.  Recent Results (from the past 240 hour(s))  MRSA PCR Screening     Status: None   Collection Time: 01/29/18 11:47 PM  Result Value Ref Range Status   MRSA by PCR NEGATIVE NEGATIVE Final    Comment:        The GeneXpert MRSA Assay (FDA approved for  NASAL specimens only), is one component of a comprehensive MRSA colonization surveillance program. It is not intended to diagnose MRSA infection nor to guide or monitor treatment for MRSA infections. Performed at [facility], [place] [address], [place], [place] [address]   Culture, blood (routine x 2)     Status: None (Preliminary result)   Collection Time: 01/30/18  3:48 AM  Result Value Ref Range Status   Specimen Description BLOOD LEFT ANTECUBITAL  Final   Special Requests   Final    BOTTLES DRAWN AEROBIC AND ANAEROBIC Blood Culture adequate volume   Culture   Final    NO GROWTH 2 DAYS Performed at [facility], [place] [address] [place], [place] [address]    Report Status PENDING  Incomplete  Culture, blood (routine x 2)     Status: None (Preliminary result)   Collection Time: 01/30/18  4:10 AM  Result Value Ref Range Status   Specimen Description BLOOD LEFT HAND  Final   Special Requests   Final    BOTTLES DRAWN AEROBIC AND ANAEROBIC Blood Culture adequate volume   Culture   Final    NO GROWTH 2 DAYS Performed at [facility], [place] [address] [place], [place] [address]    Report Status PENDING  Incomplete     Radiology Studies: No results found.  Scheduled Meds: . amLODipine  5 mg Oral Daily  . calcium acetate  2,001 mg Oral TID WC  . carvedilol  12.5 mg Oral BID WC  . Chlorhexidine Gluconate Cloth  6 each Topical Q0600  .  Chlorhexidine Gluconate Cloth  6 each Topical Q0600  . Chlorhexidine Gluconate Cloth  6 each Topical Q0600  . Chlorhexidine Gluconate Cloth  6 each Topical Q0600  . escitalopram  10 mg Oral Daily  . feeding supplement (NEPRO CARB STEADY)  237 mL Oral BID BM  . heparin  5,000 Units Subcutaneous Q8H  . multivitamin  1 tablet Oral QHS  . [START ON 02/02/2018] predniSONE  60 mg Oral Q breakfast  . sodium chloride flush  3 mL Intravenous Q12H   Continuous Infusions: . sodium chloride 250 mL (01/30/18 3300)  . sodium chloride Stopped (01/31/18 2139)  . azithromycin 500 mg (01/31/18 2139)  . cefTRIAXone (ROCEPHIN)  IV Stopped (01/31/18 2128)     LOS: 3 days   Marylu Lund, MD [facility] Pager [phone]  If 7PM-7AM, please contact night-coverage [email or web address] Password [facility] 02/01/2018, 5:45 PM

## 2018-02-01 NOTE — Progress Notes (Signed)
[facility] ROUNDING NOTE   Subjective:   Refusing vitals check overnight. Seen in HD. Sleepy but overall doing well.   Objective:  Vital signs in last 24 hours:  Temp:  [98.2 F (36.8 C)] 98.2 F (36.8 C) (07/19 0457) Pulse Rate:  [79-93] 88 (07/19 0649) Resp:  [16-18] 16 (07/18 2056) BP: (148-182)/(90-120) 163/98 (07/19 0649) SpO2:  [97 %-100 %] 97 % (07/19 0457) Weight:  [227 lb 15.3 oz (103.4 kg)] 227 lb 15.3 oz (103.4 kg) (07/19 0450)  Weight change: 2 lb 13.9 oz (1.3 kg) Filed Weights   01/30/18 1330 01/31/18 0500 02/01/18 0450  Weight: 225 lb 1.4 oz (102.1 kg) 220 lb (99.8 kg) 227 lb 15.3 oz (103.4 kg)    Intake/Output: I/O last 3 completed shifts: In: 841.1 [P.O.:240; IV Piggyback:601.1] Out: 75 [Urine:75]   Intake/Output this shift:  No intake/output data recorded.  General appearance: alert and cooperative Resp: diminished breath sounds bibasilar Chest wall: no tenderness Cardio: regular rate and rhythm, S1, S2 normal, no mrg  Extremities: edema trace -1+ bilaterally  Abdomen renal allograft RLQ nontender   Basic Metabolic Panel: Recent Labs  Lab 01/29/18 1705 01/30/18 0423 01/30/18 1347 01/31/18 0515 02/01/18 0440  NA 144 141 141 139 137  K 5.1 5.1 4.9 4.4 4.5  CL 107 108 109 102 100  CO2 14* 12* 14* 20* 19*  GLUCOSE 93 101* 105* 139* 134*  BUN 133* 143* 145* 82* 111*  CREATININE 19.32* 19.14*  19.14* 19.35* 13.46* 14.99*  CALCIUM 6.2* 5.8* 6.2* 6.8* 6.8*  MG  --  2.2  --   --   --   PHOS  --  8.7* 9.7*  --   --     Liver Function Tests: Recent Labs  Lab 01/29/18 1705 01/30/18 0423 01/30/18 1347  AST 35 33  --   ALT 11 11  --   ALKPHOS 82 70  --   BILITOT 0.7 0.6  --   PROT 8.2* 7.5  --   ALBUMIN 3.7 3.3* 3.0*   No results for input(s): LIPASE, AMYLASE in the last 168 hours. No results for input(s): AMMONIA in the last 168 hours.  CBC: Recent Labs  Lab 01/29/18 1705 01/30/18 0423 02/01/18 0440  WBC 12.9*  13.5* 16.0*  NEUTROABS 10.4*  --   --   HGB 10.7* 9.6* 9.6*  HCT 31.0* 28.8* 28.8*  MCV 87.8 89.2 88.9  PLT 295 267 303    Cardiac Enzymes: Recent Labs  Lab 01/29/18 1705 01/30/18 0423 01/30/18 1033 01/30/18 1741  CKTOTAL 268  --   --   --   TROPONINI  --  0.12* 0.10* 0.08*    BNP: Invalid input(s): POCBNP  CBG: Recent Labs  Lab 01/30/18 2042  GLUCAP 135*    Microbiology: Results for orders placed or performed during the hospital encounter of 01/29/18  MRSA PCR Screening     Status: None   Collection Time: 01/29/18 11:47 PM  Result Value Ref Range Status   MRSA by PCR NEGATIVE NEGATIVE Final    Comment:        The GeneXpert MRSA Assay (FDA approved for NASAL specimens only), is one component of a comprehensive MRSA colonization surveillance program. It is not intended to diagnose MRSA infection nor to guide or monitor treatment for MRSA infections. Performed at [facility], [place] [address] [place], [place] [address]   Culture, blood (routine x 2)     Status: None (Preliminary result)  Collection Time: 01/30/18  3:48 AM  Result Value Ref Range Status   Specimen Description BLOOD LEFT ANTECUBITAL  Final   Special Requests   Final    BOTTLES DRAWN AEROBIC AND ANAEROBIC Blood Culture adequate volume   Culture   Final    NO GROWTH 2 DAYS Performed at [facility], [address] [place], [place] [address]    Report Status PENDING  Incomplete  Culture, blood (routine x 2)     Status: None (Preliminary result)   Collection Time: 01/30/18  4:10 AM  Result Value Ref Range Status   Specimen Description BLOOD LEFT HAND  Final   Special Requests   Final    BOTTLES DRAWN AEROBIC AND ANAEROBIC Blood Culture adequate volume   Culture   Final    NO GROWTH 2 DAYS Performed at [facility], [place] [address] [place], [place] [address]    Report Status PENDING  Incomplete    Coagulation Studies: No results for input(s): LABPROT, INR in the last  72 hours.  Urinalysis: No results for input(s): COLORURINE, LABSPEC, PHURINE, GLUCOSEU, HGBUR, BILIRUBINUR, KETONESUR, PROTEINUR, UROBILINOGEN, NITRITE, LEUKOCYTESUR in the last 72 hours.  Invalid input(s): APPERANCEUR    Imaging: No results found.   Medications:   . sodium chloride 250 mL (01/30/18 7322)  . sodium chloride 10 mL/hr at 01/31/18 2100  . azithromycin 500 mg (01/31/18 2139)  . cefTRIAXone (ROCEPHIN)  IV 1 g (01/31/18 2056)   . amLODipine  5 mg Oral Daily  . calcium acetate  2,001 mg Oral TID WC  . carvedilol  12.5 mg Oral BID WC  . Chlorhexidine Gluconate Cloth  6 each Topical Q0600  . Chlorhexidine Gluconate Cloth  6 each Topical Q0600  . Chlorhexidine Gluconate Cloth  6 each Topical Q0600  . Chlorhexidine Gluconate Cloth  6 each Topical Q0600  . feeding supplement (NEPRO CARB STEADY)  237 mL Oral BID BM  . heparin  5,000 Units Subcutaneous Q8H  . methylPREDNISolone (SOLU-MEDROL) injection  60 mg Intravenous Q8H  . multivitamin  1 tablet Oral QHS  . sodium chloride flush  3 mL Intravenous Q12H   sodium chloride, acetaminophen **OR** acetaminophen, hydrALAZINE, ondansetron (ZOFRAN) IV, oxymetazoline, sodium chloride flush  Assessment/ Plan:  Pt is a47 y.o.yo malewith a PMHX of ESRD s/p renal transplant in 2014 at [facility], HTN, parathyroid hyperplasia s/p parathyroidectomy who presented with signs and symptoms of acute renal failure due to nonadherence with immunosuppressants.   1. Acute renal failure 2/2 non compliance with immunosuppressants for renal transplant: on IV solumedrol.  This is probably end stage renal disease, but discussed possible renal biopsy to see if anything salvageable.  HD today.  2. GUR:KYHCWCBJS with HD. Started on amlodipine yesterday. Also on Coreg.  3. Anemia: Hgb stable at 9.6  4. Hyperparathyroidism/hyper phos: On calcium acetate 5. Volume overload: improved post HD  6. AVF RUE: functioning   LOS: 3 [place]  Santos-Sanchez 02/01/2018 9:07 AM

## 2018-02-01 NOTE — Progress Notes (Signed)
Patient's b/p rechecked after he was given b/p meds- hemo called for report and stated that the b/p was ok for hemo. Last pressure was 163/93- will pass along to next RN

## 2018-02-02 LAB — BASIC METABOLIC PANEL
Anion gap: 14 (ref 5–15)
BUN: 85 mg/dL — AB (ref 6–20)
CO2: 25 mmol/L (ref 22–32)
Calcium: 7 mg/dL — ABNORMAL LOW (ref 8.9–10.3)
Chloride: 101 mmol/L (ref 98–111)
Creatinine, Ser: 10.95 mg/dL — ABNORMAL HIGH (ref 0.61–1.24)
GFR calc non Af Amer: 5 mL/min — ABNORMAL LOW (ref >60)
GFR, EST AFRICAN AMERICAN: 6 mL/min — AB (ref >60)
Glucose, Bld: 125 mg/dL — ABNORMAL HIGH (ref 70–99)
POTASSIUM: 4.3 mmol/L (ref 3.5–5.1)
SODIUM: 140 mmol/L (ref 135–145)

## 2018-02-02 MED ORDER — PREDNISONE 20 MG PO TABS
30.0000 mg | ORAL_TABLET | Freq: Every day | ORAL | Status: DC
  Administered 2018-02-03: 30 mg via ORAL
  Filled 2018-02-02: qty 1

## 2018-02-02 MED ORDER — AMLODIPINE BESYLATE 10 MG PO TABS
10.0000 mg | ORAL_TABLET | Freq: Every day | ORAL | Status: DC
  Administered 2018-02-03 – 2018-02-04 (×2): 10 mg via ORAL
  Filled 2018-02-02 (×2): qty 1

## 2018-02-02 MED ORDER — AZITHROMYCIN 500 MG PO TABS
500.0000 mg | ORAL_TABLET | Freq: Every day | ORAL | Status: AC
  Administered 2018-02-02: 500 mg via ORAL
  Filled 2018-02-02: qty 1

## 2018-02-02 MED ORDER — CARVEDILOL 25 MG PO TABS
25.0000 mg | ORAL_TABLET | Freq: Two times a day (BID) | ORAL | Status: DC
  Administered 2018-02-03 (×2): 25 mg via ORAL
  Filled 2018-02-02 (×2): qty 1

## 2018-02-02 MED ORDER — AMLODIPINE BESYLATE 5 MG PO TABS
5.0000 mg | ORAL_TABLET | Freq: Once | ORAL | Status: AC
  Administered 2018-02-02: 5 mg via ORAL
  Filled 2018-02-02: qty 1

## 2018-02-02 MED ORDER — CARVEDILOL 12.5 MG PO TABS
12.5000 mg | ORAL_TABLET | Freq: Once | ORAL | Status: AC
  Administered 2018-02-02: 12.5 mg via ORAL
  Filled 2018-02-02: qty 1

## 2018-02-02 NOTE — Progress Notes (Signed)
Patient BP 176/111. PRN Hydralazine 10 mg given and Dr. Wyline Copas notified. He ordered a one time dose of Norvasc 5 mg and will increase Norvasc daily to  10 mg starting tomorrow and will increase Coreg to 25 mg BID. Will administer Norvasc and continue to monitor.

## 2018-02-02 NOTE — Progress Notes (Signed)
PROGRESS NOTE    Jerry [name]  VQQ:[id number] DOB: September 19, 1970 DOA: 01/29/2018 PCP: System, Pcp Not In    Brief Narrative:  47 y.o. male, w hx of ESRD s/p renal transplant, w normal creatinine previously , apparently had not been taking his transplant medication for the past 1 month.  Pt presents due blood in urine, decrease in appetite along with nausea and vomitting  and then has not made urine today .  Pt has slight congestion, slight dyspnea. + orthopnea.   Denies fever, chills, cp, palp.   In ED, nephrology consulted (Dr Justin Mend) who recommended transfer to [place]:   Principal Problem:   ARF (acute renal failure) ([place]) Active Problems:   Hypertensive urgency   Anemia   ARF -Have stopped Lisinopril  -Discussed with Nephrology. Plan to initiate hemodialysis -Admitted to not taking meds or immunosuppressive meds over the past several months at least -Patient continued on steroids with possibility for renal biopsy. Unlikley kidney will be salvaged -cr has improved slightly. Labs reviewed. Repeat BMET in AM -Nephrology continuing with HD as tolerated -CLIP is in progress  Hypertensive urgency -BP remains poorly controlled -Initially started Amlodipine 5mg  po qday, increase to 10mg  -Initially started on carvedilol 12.5mg  po bid, will increase to 25mg  -Will continue on Hydralazine 10mg  iv q6h prn sbp >160  Troponin elevation -mild trop leak suspected -LIkley trop leak in setting of end-stage level renal failure -Remains without chest pains. Will monitor  ESRD s/p transplant -Nephrology has recommended resuming steroids per above -Tolerating HD, suspicion for end stage kidney disease  Anemia -No evidence of acute blood loss. Labs reviewed -Follow CBC trends  Hypocalcemia -given replacement -Continue to monitor electrolytes  CAP/ right pleural effusion -Patient is continued on empiric rocephin an azithromycin -Would complete  total of 5 days of abx (would complete after today's course)  Grade 2 diastolic CHF, chronicity unknown, stable  DVT prophylaxis: Heparin subq Code Status: Full Family Communication: Pt in room, family at bedside Disposition Plan: Uncertain at this time  Consultants:   Nephrology  Procedures:     Antimicrobials: Anti-infectives (From admission, onward)   Start     Dose/Rate Route Frequency Ordered Stop   02/02/18 2200  azithromycin (ZITHROMAX) tablet 500 mg     500 mg Oral Daily at bedtime 02/02/18 1141     01/30/18 1900  cefTRIAXone (ROCEPHIN) 1 g in sodium chloride 0.9 % 100 mL IVPB     1 g 200 mL/hr over 30 Minutes Intravenous Every 24 hours 01/29/18 2149     01/29/18 2200  azithromycin (ZITHROMAX) 500 mg in sodium chloride 0.9 % 250 mL IVPB  Status:  Discontinued     500 mg 250 mL/hr over 60 Minutes Intravenous Every 24 hours 01/29/18 2149 02/02/18 1141   01/29/18 1900  vancomycin (VANCOCIN) 2,500 mg in sodium chloride 0.9 % 500 mL IVPB  Status:  Discontinued     2,500 mg 250 mL/hr over 120 Minutes Intravenous  Once 01/29/18 1810 01/29/18 1844   01/29/18 1830  vancomycin (VANCOCIN) 2,500 mg in sodium chloride 0.9 % 500 mL IVPB  Status:  Discontinued     2,500 mg 250 mL/hr over 120 Minutes Intravenous  Once 01/29/18 1809 01/29/18 1810   01/29/18 1830  ceFEPIme (MAXIPIME) 2 g in sodium chloride 0.9 % 100 mL IVPB     2 g 200 mL/hr over 30 Minutes Intravenous  Once 01/29/18 1809 01/29/18 1947      Subjective: No complaints  today. Tolerating diet  Objective: Vitals:   02/02/18 0650 02/02/18 0659 02/02/18 1339 02/02/18 1721  BP: (!) 186/107 (!) 162/98 (!) 157/104 (!) 176/111  Pulse: 85  76 86  Resp:   18   Temp:   98.4 F (36.9 C)   TempSrc:   Oral   SpO2:   98%   Weight:      Height:        Intake/Output Summary (Last 24 hours) at 02/02/2018 1755 Last data filed at 02/02/2018 0900 Gross per 24 hour  Intake 590 ml  Output -  Net 590 ml   Filed Weights    02/01/18 0755 02/01/18 1134 02/02/18 0521  Weight: 101.9 kg (224 lb 10.4 oz) 98.9 kg (218 lb 0.6 oz) 100.3 kg (221 lb 1.6 oz)    Examination: General exam: Conversant, in no acute distress Respiratory system: normal chest rise, clear, no audible wheezing Cardiovascular system: regular rhythm, s1-s2 Gastrointestinal system: Nondistended, nontender, pos BS Central nervous system: No seizures, no tremors Extremities: No cyanosis, no joint deformities Skin: No rashes, no pallor Psychiatry: Affect normal // no auditory hallucinations   Data Reviewed: I have personally reviewed following labs and imaging studies  CBC: Recent Labs  Lab 01/29/18 1705 01/30/18 0423 02/01/18 0440  WBC 12.9* 13.5* 16.0*  NEUTROABS 10.4*  --   --   HGB 10.7* 9.6* 9.6*  HCT 31.0* 28.8* 28.8*  MCV 87.8 89.2 88.9  PLT 295 267 762   Basic Metabolic Panel: Recent Labs  Lab 01/30/18 0423 01/30/18 1347 01/31/18 0515 02/01/18 0440 02/02/18 0553  NA 141 141 139 137 140  K 5.1 4.9 4.4 4.5 4.3  CL 108 109 102 100 101  CO2 12* 14* 20* 19* 25  GLUCOSE 101* 105* 139* 134* 125*  BUN 143* 145* 82* 111* 85*  CREATININE 19.14*  19.14* 19.35* 13.46* 14.99* 10.95*  CALCIUM 5.8* 6.2* 6.8* 6.8* 7.0*  MG 2.2  --   --   --   --   PHOS 8.7* 9.7*  --   --   --    GFR: Estimated Creatinine Clearance: 9.6 mL/min (A) (by C-G formula based on SCr of 10.95 mg/dL (H)). Liver Function Tests: Recent Labs  Lab 01/29/18 1705 01/30/18 0423 01/30/18 1347  AST 35 33  --   ALT 11 11  --   ALKPHOS 82 70  --   BILITOT 0.7 0.6  --   PROT 8.2* 7.5  --   ALBUMIN 3.7 3.3* 3.0*   No results for input(s): LIPASE, AMYLASE in the last 168 hours. No results for input(s): AMMONIA in the last 168 hours. Coagulation Profile: No results for input(s): INR, PROTIME in the last 168 hours. Cardiac Enzymes: Recent Labs  Lab 01/29/18 1705 01/30/18 0423 01/30/18 1033 01/30/18 1741  CKTOTAL 268  --   --   --   TROPONINI  --  0.12*  0.10* 0.08*   BNP (last 3 results) No results for input(s): PROBNP in the last 8760 hours. HbA1C: No results for input(s): HGBA1C in the last 72 hours. CBG: Recent Labs  Lab 01/30/18 2042  GLUCAP 135*   Lipid Profile: No results for input(s): CHOL, HDL, LDLCALC, TRIG, CHOLHDL, LDLDIRECT in the last 72 hours. Thyroid Function Tests: No results for input(s): TSH, T4TOTAL, FREET4, T3FREE, THYROIDAB in the last 72 hours. Anemia Panel: No results for input(s): VITAMINB12, FOLATE, FERRITIN, TIBC, IRON, RETICCTPCT in the last 72 hours. Sepsis Labs: No results for input(s): PROCALCITON, LATICACIDVEN in the last  168 hours.  Recent Results (from the past 240 hour(s))  MRSA PCR Screening     Status: None   Collection Time: 01/29/18 11:47 PM  Result Value Ref Range Status   MRSA by PCR NEGATIVE NEGATIVE Final    Comment:        The GeneXpert MRSA Assay (FDA approved for NASAL specimens only), is one component of a comprehensive MRSA colonization surveillance program. It is not intended to diagnose MRSA infection nor to guide or monitor treatment for MRSA infections. Performed at [facility], [place] [address], [place], [place] [address]   Culture, blood (routine x 2)     Status: None (Preliminary result)   Collection Time: 01/30/18  3:48 AM  Result Value Ref Range Status   Specimen Description BLOOD LEFT ANTECUBITAL  Final   Special Requests   Final    BOTTLES DRAWN AEROBIC AND ANAEROBIC Blood Culture adequate volume   Culture   Final    NO GROWTH 3 DAYS Performed at [facility], [place] [address] [place], [place] [address]    Report Status PENDING  Incomplete  Culture, blood (routine x 2)     Status: None (Preliminary result)   Collection Time: 01/30/18  4:10 AM  Result Value Ref Range Status   Specimen Description BLOOD LEFT HAND  Final   Special Requests   Final    BOTTLES DRAWN AEROBIC AND ANAEROBIC Blood Culture adequate volume   Culture   Final    NO GROWTH  3 DAYS Performed at [facility], [place] [address] [place], [place] [address]    Report Status PENDING  Incomplete     Radiology Studies: No results found.  Scheduled Meds: . [START ON 02/03/2018] amLODipine  10 mg Oral Daily  . amLODipine  5 mg Oral Once  . azithromycin  500 mg Oral QHS  . calcium acetate  2,001 mg Oral TID WC  . carvedilol  12.5 mg Oral Once  . [START ON 02/03/2018] carvedilol  25 mg Oral BID WC  . Chlorhexidine Gluconate Cloth  6 each Topical Q0600  . Chlorhexidine Gluconate Cloth  6 each Topical Q0600  . Chlorhexidine Gluconate Cloth  6 each Topical Q0600  . Chlorhexidine Gluconate Cloth  6 each Topical Q0600  . escitalopram  10 mg Oral Daily  . feeding supplement (NEPRO CARB STEADY)  237 mL Oral BID BM  . heparin  5,000 Units Subcutaneous Q8H  . multivitamin  1 tablet Oral QHS  . predniSONE  60 mg Oral Q breakfast  . sodium chloride flush  3 mL Intravenous Q12H   Continuous Infusions: . sodium chloride 250 mL (01/30/18 0962)  . sodium chloride Stopped (01/31/18 2139)  . cefTRIAXone (ROCEPHIN)  IV 1 g (02/01/18 2020)     LOS: 4 days   Marylu Lund, MD [facility] Pager [phone]  If 7PM-7AM, please contact night-coverage [email or web address] Password [facility] 02/02/2018, 5:55 PM

## 2018-02-02 NOTE — Progress Notes (Signed)
Assessment/ Plan:  Pt is a80 y.o.yo malewith a PMHX of ESRD s/p renal transplant in 2014 at [facility], HTN, parathyroid hyperplasia s/p parathyroidectomy who presented with renal transplant failure   1. Acute renal failure 2/2 non compliance with immunosuppressants for renal transplant:on PO Steroids.This is probably end stage renal disease, will reduce pred to 30/d. HD last on 7/19.  2. GYI:RSWNIOEV, coreg increased 3. Anemia: Hgb stable at 9.6  4. Hyperparathyroidism/hyper phos: On calcium acetate 5.Volume overload:improved post HD  6. AVF RUE: functioning  Subjective: Interval History: feels better, minimal UOP  Objective: Vital signs in last 24 hours: Temp:  [98.3 F (36.8 C)-98.5 F (36.9 C)] 98.4 F (36.9 C) (07/20 1339) Pulse Rate:  [75-86] 81 (07/20 1811) Resp:  [18] 18 (07/20 1339) BP: (133-186)/(83-111) 160/104 (07/20 1811) SpO2:  [94 %-98 %] 98 % (07/20 1339) Weight:  [100.3 kg (221 lb 1.6 oz)] 100.3 kg (221 lb 1.6 oz) (07/20 0521) Weight change: -1.5 kg (-3 lb 4.9 oz)  Intake/Output from previous day: 07/19 0701 - 07/20 0700 In: 451.8 [I.V.:1.8; IV Piggyback:450] Out: 3000  Intake/Output this shift: Total I/O In: 240 [P.O.:240] Out: -   General appearance: alert and cooperative Resp: clear to auscultation bilaterally Cardio: regular rate and rhythm, S1, S2 normal, no murmur, click, rub or gallop GI: soft, non-tender; bowel sounds normal; no masses,  no organomegaly Extremities: RUE AVF ok  Lab Results: Recent Labs    02/01/18 0440  WBC 16.0*  HGB 9.6*  HCT 28.8*  PLT 303   BMET:  Recent Labs    02/01/18 0440 02/02/18 0553  NA 137 140  K 4.5 4.3  CL 100 101  CO2 19* 25  GLUCOSE 134* 125*  BUN 111* 85*  CREATININE 14.99* 10.95*  CALCIUM 6.8* 7.0*   No results for input(s): PTH in the last 72 hours. Iron Studies: No results for input(s): IRON, TIBC, TRANSFERRIN, FERRITIN in the last 72 hours. Studies/Results: No results  found.  Scheduled: . [START ON 02/03/2018] amLODipine  10 mg Oral Daily  . azithromycin  500 mg Oral QHS  . calcium acetate  2,001 mg Oral TID WC  . [START ON 02/03/2018] carvedilol  25 mg Oral BID WC  . Chlorhexidine Gluconate Cloth  6 each Topical Q0600  . Chlorhexidine Gluconate Cloth  6 each Topical Q0600  . Chlorhexidine Gluconate Cloth  6 each Topical Q0600  . Chlorhexidine Gluconate Cloth  6 each Topical Q0600  . escitalopram  10 mg Oral Daily  . feeding supplement (NEPRO CARB STEADY)  237 mL Oral BID BM  . heparin  5,000 Units Subcutaneous Q8H  . multivitamin  1 tablet Oral QHS  . predniSONE  60 mg Oral Q breakfast  . sodium chloride flush  3 mL Intravenous Q12H       LOS: 4 days   Estanislado Emms 02/02/2018,6:28 PM

## 2018-02-03 MED ORDER — CHLORHEXIDINE GLUCONATE CLOTH 2 % EX PADS
6.0000 | MEDICATED_PAD | Freq: Every day | CUTANEOUS | Status: DC
  Administered 2018-02-04: 6 via TOPICAL

## 2018-02-03 NOTE — Progress Notes (Signed)
5W 23  BP remains high 155/102 despite PRN Hydralazine given

## 2018-02-03 NOTE — Progress Notes (Signed)
PROGRESS NOTE    Jerry [name]  QPY:[id number] DOB: 18-May-1971 DOA: 01/29/2018 PCP: System, Pcp Not In    Brief Narrative:  47 y.o. male, w hx of ESRD s/p renal transplant, w normal creatinine previously , apparently had not been taking his transplant medication for the past 1 month.  Pt presents due blood in urine, decrease in appetite along with nausea and vomitting  and then has not made urine today .  Pt has slight congestion, slight dyspnea. + orthopnea.   Denies fever, chills, cp, palp.   In ED, nephrology consulted (Dr Justin Mend) who recommended transfer to [place]:   Principal Problem:   ARF (acute renal failure) ([place]) Active Problems:   Hypertensive urgency   Anemia   ARF -Have stopped Lisinopril  -Discussed with Nephrology. Plan to initiate hemodialysis -Admitted to not taking meds or immunosuppressive meds over the past several months at least -Patient continued on steroids with possibility for renal biopsy. Unlikley kidney will be salvaged -cr has improved slightly. Labs reviewed. Repeat BMET in AM -Nephrology continuing with HD as tolerated -CLIP remains in progress. HD planned for tomorrow   Hypertensive urgency -BP remains poorly controlled -Initially started Amlodipine 5mg  po qday, increase to 10mg  -Initially started on carvedilol 12.5mg  po bid, will increase to 25mg  -Will continue on Hydralazine 10mg  iv q6h prn sbp >160  Troponin elevation -mild trop leak suspected -LIkley trop leak in setting of end-stage level renal failure -Remains without chest pains. Will monitor  ESRD s/p transplant -Nephrology has recommended resuming steroids per above -Tolerating HD, suspicion for end stage kidney disease  Anemia -No evidence of acute blood loss. Labs reviewed -Follow CBC trends  Hypocalcemia -given replacement -Continue to monitor electrolytes  CAP/ right pleural effusion -Patient is continued on empiric rocephin an  azithromycin -Would complete total of 5 days of abx (would complete after today's course)  Grade 2 diastolic CHF, chronicity unknown, stable  DVT prophylaxis: Heparin subq Code Status: Full Family Communication: Pt in room, family at bedside Disposition Plan: Uncertain at this time  Consultants:   Nephrology  Procedures:     Antimicrobials: Anti-infectives (From admission, onward)   Start     Dose/Rate Route Frequency Ordered Stop   02/02/18 2200  azithromycin (ZITHROMAX) tablet 500 mg     500 mg Oral Daily at bedtime 02/02/18 1141 02/02/18 2138   01/30/18 1900  cefTRIAXone (ROCEPHIN) 1 g in sodium chloride 0.9 % 100 mL IVPB     1 g 200 mL/hr over 30 Minutes Intravenous Every 24 hours 01/29/18 2149 02/02/18 1847   01/29/18 2200  azithromycin (ZITHROMAX) 500 mg in sodium chloride 0.9 % 250 mL IVPB  Status:  Discontinued     500 mg 250 mL/hr over 60 Minutes Intravenous Every 24 hours 01/29/18 2149 02/02/18 1141   01/29/18 1900  vancomycin (VANCOCIN) 2,500 mg in sodium chloride 0.9 % 500 mL IVPB  Status:  Discontinued     2,500 mg 250 mL/hr over 120 Minutes Intravenous  Once 01/29/18 1810 01/29/18 1844   01/29/18 1830  vancomycin (VANCOCIN) 2,500 mg in sodium chloride 0.9 % 500 mL IVPB  Status:  Discontinued     2,500 mg 250 mL/hr over 120 Minutes Intravenous  Once 01/29/18 1809 01/29/18 1810   01/29/18 1830  ceFEPIme (MAXIPIME) 2 g in sodium chloride 0.9 % 100 mL IVPB     2 g 200 mL/hr over 30 Minutes Intravenous  Once 01/29/18 1809 01/29/18 1947  Subjective: Without complaints today. No sob  Objective: Vitals:   02/03/18 0500 02/03/18 0501 02/03/18 0648 02/03/18 1524  BP:  (!) 145/102 (!) 155/106 139/84  Pulse:  61  65  Resp:    18  Temp:  98.2 F (36.8 C)  98.1 F (36.7 C)  TempSrc:  Oral  Oral  SpO2:  100%  97%  Weight: 98.9 kg (218 lb 0.6 oz)     Height:        Intake/Output Summary (Last 24 hours) at 02/03/2018 1623 Last data filed at 02/03/2018  8299 Gross per 24 hour  Intake 123 ml  Output 50 ml  Net 73 ml   Filed Weights   02/01/18 1134 02/02/18 0521 02/03/18 0500  Weight: 98.9 kg (218 lb 0.6 oz) 100.3 kg (221 lb 1.6 oz) 98.9 kg (218 lb 0.6 oz)    Examination: General exam: Awake, laying in bed, in nad Respiratory system: Normal respiratory effort, no wheezing Cardiovascular system: regular rate, s1, s2  Data Reviewed: I have personally reviewed following labs and imaging studies  CBC: Recent Labs  Lab 01/29/18 1705 01/30/18 0423 02/01/18 0440  WBC 12.9* 13.5* 16.0*  NEUTROABS 10.4*  --   --   HGB 10.7* 9.6* 9.6*  HCT 31.0* 28.8* 28.8*  MCV 87.8 89.2 88.9  PLT 295 267 371   Basic Metabolic Panel: Recent Labs  Lab 01/30/18 0423 01/30/18 1347 01/31/18 0515 02/01/18 0440 02/02/18 0553  NA 141 141 139 137 140  K 5.1 4.9 4.4 4.5 4.3  CL 108 109 102 100 101  CO2 12* 14* 20* 19* 25  GLUCOSE 101* 105* 139* 134* 125*  BUN 143* 145* 82* 111* 85*  CREATININE 19.14*  19.14* 19.35* 13.46* 14.99* 10.95*  CALCIUM 5.8* 6.2* 6.8* 6.8* 7.0*  MG 2.2  --   --   --   --   PHOS 8.7* 9.7*  --   --   --    GFR: Estimated Creatinine Clearance: 9.5 mL/min (A) (by C-G formula based on SCr of 10.95 mg/dL (H)). Liver Function Tests: Recent Labs  Lab 01/29/18 1705 01/30/18 0423 01/30/18 1347  AST 35 33  --   ALT 11 11  --   ALKPHOS 82 70  --   BILITOT 0.7 0.6  --   PROT 8.2* 7.5  --   ALBUMIN 3.7 3.3* 3.0*   No results for input(s): LIPASE, AMYLASE in the last 168 hours. No results for input(s): AMMONIA in the last 168 hours. Coagulation Profile: No results for input(s): INR, PROTIME in the last 168 hours. Cardiac Enzymes: Recent Labs  Lab 01/29/18 1705 01/30/18 0423 01/30/18 1033 01/30/18 1741  CKTOTAL 268  --   --   --   TROPONINI  --  0.12* 0.10* 0.08*   BNP (last 3 results) No results for input(s): PROBNP in the last 8760 hours. HbA1C: No results for input(s): HGBA1C in the last 72  hours. CBG: Recent Labs  Lab 01/30/18 2042  GLUCAP 135*   Lipid Profile: No results for input(s): CHOL, HDL, LDLCALC, TRIG, CHOLHDL, LDLDIRECT in the last 72 hours. Thyroid Function Tests: No results for input(s): TSH, T4TOTAL, FREET4, T3FREE, THYROIDAB in the last 72 hours. Anemia Panel: No results for input(s): VITAMINB12, FOLATE, FERRITIN, TIBC, IRON, RETICCTPCT in the last 72 hours. Sepsis Labs: No results for input(s): PROCALCITON, LATICACIDVEN in the last 168 hours.  Recent Results (from the past 240 hour(s))  MRSA PCR Screening     Status: None  Collection Time: 01/29/18 11:47 PM  Result Value Ref Range Status   MRSA by PCR NEGATIVE NEGATIVE Final    Comment:        The GeneXpert MRSA Assay (FDA approved for NASAL specimens only), is one component of a comprehensive MRSA colonization surveillance program. It is not intended to diagnose MRSA infection nor to guide or monitor treatment for MRSA infections. Performed at [facility], [place] [address] [place], [place] [address]   Culture, blood (routine x 2)     Status: None (Preliminary result)   Collection Time: 01/30/18  3:48 AM  Result Value Ref Range Status   Specimen Description BLOOD LEFT ANTECUBITAL  Final   Special Requests   Final    BOTTLES DRAWN AEROBIC AND ANAEROBIC Blood Culture adequate volume   Culture   Final    NO GROWTH 4 DAYS Performed at [facility], [place] [address] [place], [place] [address]    Report Status PENDING  Incomplete  Culture, blood (routine x 2)     Status: None (Preliminary result)   Collection Time: 01/30/18  4:10 AM  Result Value Ref Range Status   Specimen Description BLOOD LEFT HAND  Final   Special Requests   Final    BOTTLES DRAWN AEROBIC AND ANAEROBIC Blood Culture adequate volume   Culture   Final    NO GROWTH 4 DAYS Performed at [facility], [place] [address] [place], [place] [address]    Report Status PENDING  Incomplete     Radiology  Studies: No results found.  Scheduled Meds: . amLODipine  10 mg Oral Daily  . calcium acetate  2,001 mg Oral TID WC  . carvedilol  25 mg Oral BID WC  . [START ON 02/04/2018] Chlorhexidine Gluconate Cloth  6 each Topical Q0600  . escitalopram  10 mg Oral Daily  . feeding supplement (NEPRO CARB STEADY)  237 mL Oral BID BM  . heparin  5,000 Units Subcutaneous Q8H  . multivitamin  1 tablet Oral QHS  . predniSONE  30 mg Oral Q breakfast  . sodium chloride flush  3 mL Intravenous Q12H   Continuous Infusions: . sodium chloride 250 mL (01/30/18 6546)  . sodium chloride 10 mL/hr at 02/02/18 1817     LOS: 5 days   Marylu Lund, MD [facility] Pager [phone]  If 7PM-7AM, please contact night-coverage [email or web address] Password [facility] 02/03/2018, 4:23 PM

## 2018-02-03 NOTE — Progress Notes (Signed)
Assessment/ Plan:  Pt is a80 y.o.yo malewith a PMHX of ESRD s/p renal transplant in 2014 at [facility], HTN, parathyroid hyperplasia s/p parathyroidectomy who presented with renal transplant failure   1. Acute renal failure 2/2 lack of immunosuppressants for renal transplant:on PO Steroids.This is probably end stage renal disease.  I discussed with pt that I thought renal biopsy would be very low yield and would not recommend. Will reduce to 20mg .HDlast on 7/19.HD Monday.  Need OP spot. 2. DQQ:IWLNLG 3. Hyperparathyroidism/hyper phos:Oncalcium acetate 4.Volume overload:improved post HD  6. AVF RUE: functioning  Subjective: Interval History: Feels better  Objective: Vital signs in last 24 hours: Temp:  [98.2 F (36.8 C)-98.6 F (37 C)] 98.2 F (36.8 C) (07/21 0501) Pulse Rate:  [61-86] 61 (07/21 0501) Resp:  [18] 18 (07/20 2037) BP: (137-176)/(84-111) 155/106 (07/21 0648) SpO2:  [98 %-100 %] 100 % (07/21 0501) Weight:  [98.9 kg (218 lb 0.6 oz)] 98.9 kg (218 lb 0.6 oz) (07/21 0500) Weight change: -3 kg (-6 lb 9.8 oz)  Intake/Output from previous day: 07/20 0701 - 07/21 0700 In: 363 [P.O.:360; I.V.:3] Out: 50 [Urine:50] Intake/Output this shift: No intake/output data recorded.  General appearance: alert and cooperative Resp: clear to auscultation bilaterallys Cardio: regular rate and rhythm, S1, S2 normal, no murmur, click, rub or gallop GI: soft, non-tender; bowel sounds normal; no masses,  no organomegaly RUE AVF  Lab Results: Recent Labs    02/01/18 0440  WBC 16.0*  HGB 9.6*  HCT 28.8*  PLT 303   BMET:  Recent Labs    02/01/18 0440 02/02/18 0553  NA 137 140  K 4.5 4.3  CL 100 101  CO2 19* 25  GLUCOSE 134* 125*  BUN 111* 85*  CREATININE 14.99* 10.95*  CALCIUM 6.8* 7.0*   No results for input(s): PTH in the last 72 hours. Iron Studies: No results for input(s): IRON, TIBC, TRANSFERRIN, FERRITIN in the last 72 hours. Studies/Results: No results  found.  Scheduled: . amLODipine  10 mg Oral Daily  . calcium acetate  2,001 mg Oral TID WC  . carvedilol  25 mg Oral BID WC  . Chlorhexidine Gluconate Cloth  6 each Topical Q0600  . Chlorhexidine Gluconate Cloth  6 each Topical Q0600  . Chlorhexidine Gluconate Cloth  6 each Topical Q0600  . Chlorhexidine Gluconate Cloth  6 each Topical Q0600  . escitalopram  10 mg Oral Daily  . feeding supplement (NEPRO CARB STEADY)  237 mL Oral BID BM  . heparin  5,000 Units Subcutaneous Q8H  . multivitamin  1 tablet Oral QHS  . predniSONE  30 mg Oral Q breakfast  . sodium chloride flush  3 mL Intravenous Q12H    LOS: 5 days   Estanislado Emms 02/03/2018,12:25 PM

## 2018-02-04 DIAGNOSIS — I160 Hypertensive urgency: Secondary | ICD-10-CM

## 2018-02-04 DIAGNOSIS — N179 Acute kidney failure, unspecified: Principal | ICD-10-CM

## 2018-02-04 LAB — CBC
HCT: 29.4 % — ABNORMAL LOW (ref 39.0–52.0)
HEMOGLOBIN: 9.5 g/dL — AB (ref 13.0–17.0)
MCH: 29.2 pg (ref 26.0–34.0)
MCHC: 32.3 g/dL (ref 30.0–36.0)
MCV: 90.5 fL (ref 78.0–100.0)
Platelets: 370 10*3/uL (ref 150–400)
RBC: 3.25 MIL/uL — ABNORMAL LOW (ref 4.22–5.81)
RDW: 14.7 % (ref 11.5–15.5)
WBC: 22 10*3/uL — AB (ref 4.0–10.5)

## 2018-02-04 LAB — CULTURE, BLOOD (ROUTINE X 2)
CULTURE: NO GROWTH
Culture: NO GROWTH
SPECIAL REQUESTS: ADEQUATE
SPECIAL REQUESTS: ADEQUATE

## 2018-02-04 LAB — RENAL FUNCTION PANEL
ANION GAP: 22 — AB (ref 5–15)
Albumin: 3.3 g/dL — ABNORMAL LOW (ref 3.5–5.0)
BUN: 158 mg/dL — ABNORMAL HIGH (ref 6–20)
CALCIUM: 6.8 mg/dL — AB (ref 8.9–10.3)
CO2: 20 mmol/L — ABNORMAL LOW (ref 22–32)
Chloride: 96 mmol/L — ABNORMAL LOW (ref 98–111)
Creatinine, Ser: 14.5 mg/dL — ABNORMAL HIGH (ref 0.61–1.24)
GFR calc Af Amer: 4 mL/min — ABNORMAL LOW (ref >60)
GFR calc non Af Amer: 3 mL/min — ABNORMAL LOW (ref >60)
GLUCOSE: 119 mg/dL — AB (ref 70–99)
PHOSPHORUS: 5.7 mg/dL — AB (ref 2.5–4.6)
POTASSIUM: 5 mmol/L (ref 3.5–5.1)
Sodium: 138 mmol/L (ref 135–145)

## 2018-02-04 MED ORDER — AMLODIPINE BESYLATE 10 MG PO TABS: 10.0000 mg | ORAL_TABLET | Freq: Every day | ORAL | 0 refills | Status: DC

## 2018-02-04 MED ORDER — LIDOCAINE-PRILOCAINE 2.5-2.5 % EX CREA
1.0000 "application " | TOPICAL_CREAM | CUTANEOUS | Status: DC | PRN
  Filled 2018-02-04: qty 5

## 2018-02-04 MED ORDER — ALTEPLASE 2 MG IJ SOLR: 2.0000 mg | Freq: Once | INTRAMUSCULAR | Status: DC | PRN

## 2018-02-04 MED ORDER — CALCIUM ACETATE (PHOS BINDER) 667 MG PO CAPS: 2001.0000 mg | ORAL_CAPSULE | Freq: Three times a day (TID) | ORAL | 0 refills | Status: AC

## 2018-02-04 MED ORDER — LIDOCAINE HCL (PF) 1 % IJ SOLN: 5.0000 mL | INTRAMUSCULAR | Status: DC | PRN

## 2018-02-04 MED ORDER — SODIUM CHLORIDE 0.9 % IV SOLN: 100.0000 mL | INTRAVENOUS | Status: DC | PRN

## 2018-02-04 MED ORDER — LIDOCAINE HCL (PF) 1 % IJ SOLN
5.0000 mL | INTRAMUSCULAR | Status: DC | PRN
  Filled 2018-02-04: qty 5

## 2018-02-04 MED ORDER — RENA-VITE PO TABS: 1.0000 | ORAL_TABLET | Freq: Every day | ORAL | 0 refills | Status: AC

## 2018-02-04 MED ORDER — PREDNISONE 20 MG PO TABS: 20.0000 mg | ORAL_TABLET | Freq: Every day | ORAL | Status: DC

## 2018-02-04 MED ORDER — PREDNISONE 10 MG PO TABS: 10.0000 mg | ORAL_TABLET | Freq: Every day | ORAL | Status: DC

## 2018-02-04 MED ORDER — HEPARIN SODIUM (PORCINE) 1000 UNIT/ML DIALYSIS: 20.0000 [IU]/kg | INTRAMUSCULAR | Status: DC | PRN

## 2018-02-04 MED ORDER — ALTEPLASE 2 MG IJ SOLR
2.0000 mg | Freq: Once | INTRAMUSCULAR | Status: DC | PRN
  Filled 2018-02-04: qty 2

## 2018-02-04 MED ORDER — PENTAFLUOROPROP-TETRAFLUOROETH EX AERO: 1.0000 "application " | INHALATION_SPRAY | CUTANEOUS | Status: DC | PRN

## 2018-02-04 MED ORDER — HEPARIN SODIUM (PORCINE) 1000 UNIT/ML DIALYSIS
20.0000 [IU]/kg | INTRAMUSCULAR | Status: DC | PRN
  Filled 2018-02-04: qty 3

## 2018-02-04 MED ORDER — HEPARIN SODIUM (PORCINE) 1000 UNIT/ML DIALYSIS: 1000.0000 [IU] | INTRAMUSCULAR | Status: DC | PRN

## 2018-02-04 MED ORDER — CARVEDILOL 25 MG PO TABS: 25.0000 mg | ORAL_TABLET | Freq: Two times a day (BID) | ORAL | 0 refills | Status: DC

## 2018-02-04 MED ORDER — HEPARIN SODIUM (PORCINE) 1000 UNIT/ML DIALYSIS
1000.0000 [IU] | INTRAMUSCULAR | Status: DC | PRN
  Filled 2018-02-04: qty 1

## 2018-02-04 NOTE — Progress Notes (Signed)
Accepted at [facility] .1st treatment is :Tuesday ,July 23,2019.Jerry [name] is : Tuesday,Thursday,Saturday at 12:15 need to arrive 30 min early 1st treatment

## 2018-02-04 NOTE — Discharge Summary (Signed)
Physician Discharge Summary  Mckinnon Glick HEN:[id number] DOB: Oct 26, 1970 DOA: 01/29/2018  PCP: System, Pcp Not In  Admit date: 01/29/2018 Discharge date: 02/04/2018  Admitted From: Home Disposition:  Home  Recommendations for Outpatient Follow-up:  1. Follow up with PCP in 1-2 weeks 2. Follow up with scheduled MWF HD, first session on 02/06/18  Discharge Condition:Stable CODE STATUS:Full Diet recommendation: Renal   Brief/Interim Summary: 47 y.o.male,w hx of ESRD s/p renal transplant, w normal creatinine previously , apparently had not been taking his transplant medication for the past 1 month. Pt presents due blood in urine, decrease in appetite along with nausea and vomitting and then has not made urine today . Pt has slight congestion, slight dyspnea. + orthopnea. Denies fever, chills, cp, palp.   In ED, nephrology consulted (Dr Justin Mend) who recommended transfer to [facility]  ARF -Have stopped Lisinopril  -Consulted Nephrology. Patient has been started on hemodialysis -Admitted to not taking meds or immunosuppressive meds over the past several months at least -Patient was initially started on steroids. Unlikley kidney will be salvaged. Per nephrology, recommendation to taper steroids as outpatient -Patient to continue MWF HD, first session on 7/24  Hypertensive urgency -BP initially poorly controlled -better BP control on amlodipine 10mg  and coreg 25mg  bid  Troponin elevation -mild trop leak suspected -LIkley trop leak in setting of end-stage level renal failure -Remains without chest pains.   ESRD s/p transplant -Nephrology had initially recommended resuming steroids per above -Now on MWF HD -Nephrology recommends weaning steroids as outpatient  Anemia -No evidence of acute blood loss. Labs reviewed -CBC remained stable  Hypocalcemia -given replacement -medically stable  CAP/ right pleural effusion -Completed 5 days course of ABX, rocephin  and azithromycin -Remained medically stable  Grade 2 diastolic CHF, chronicity unknown, stable   Discharge Diagnoses:  Principal Problem:   ARF (acute renal failure) ([place]) Active Problems:   Hypertensive urgency   Anemia    Discharge Instructions   Allergies as of 02/04/2018   No Known Allergies     Medication List    STOP taking these medications   lisinopril 2.5 MG tablet Commonly known as:  PRINIVIL,ZESTRIL   MYCOPHENOLIC ACID PO   tacrolimus 0.5 MG capsule Commonly known as:  PROGRAF     TAKE these medications   amLODipine 10 MG tablet Commonly known as:  NORVASC Take 1 tablet (10 mg total) by mouth daily. Start taking on:  02/05/2018   calcium acetate 667 MG capsule Commonly known as:  PHOSLO Take 3 capsules (2,001 mg total) by mouth 3 (three) times daily with meals.   carvedilol 25 MG tablet Commonly known as:  COREG Take 1 tablet (25 mg total) by mouth 2 (two) times daily with a meal.   escitalopram 20 MG tablet Commonly known as:  LEXAPRO Take 10 mg by mouth daily.   multivitamin Tabs tablet Take 1 tablet by mouth at bedtime.   predniSONE 10 MG tablet Commonly known as:  DELTASONE Take 1 tablet (10 mg total) by mouth daily with breakfast.      Follow-up Information    Follow up with your PCP in 1-2 weeks. Schedule an appointment as soon as possible for a visit.        Follow up with your dialysis as scheduled Follow up.   Why:  Monday,Wednesday,Friday 12 noon  Please be there at 11:30am on 06 February 2018 Contact information: [place]         No  Known Allergies  Consultations:  Nephrology  Procedures/Studies: Dg Chest 2 View  Result Date: 01/29/2018 CLINICAL DATA:  Shortness of breath and chest pain EXAM: CHEST - 2 VIEW COMPARISON:  None. FINDINGS: There is airspace consolidation in the left upper lobe, felt to represent pneumonia. There is more subtle consolidation in the left base. There  is a right pleural effusion with fluid tracking into the right minor fissure. There is cardiomegaly with pulmonary venous hypertension. No adenopathy. No evident bone lesion. IMPRESSION: Left upper lobe airspace consolidation, felt to represent pneumonia. Smaller area of consolidation left base. Right pleural effusion. Pulmonary vascular congestion. Electronically Signed   By: Lowella Grip III M.D.   On: 01/29/2018 14:04     Subjective: In good spirits. Eager to go home  Discharge Exam: Vitals:   02/04/18 1230 02/04/18 1245  BP: 118/78 123/84  Pulse:    Resp: 16 17  Temp:    SpO2:     Vitals:   02/04/18 1200 02/04/18 1215 02/04/18 1230 02/04/18 1245  BP: 117/87 120/80 118/78 123/84  Pulse: 82     Resp: 13 15 16 17   Temp:      TempSrc:      SpO2:      Weight:      Height:        General: Pt is alert, awake, not in acute distress Cardiovascular: RRR, S1/S2 +, no rubs, no gallops Respiratory: CTA bilaterally, no wheezing, no rhonchi Abdominal: Soft, NT, ND, bowel sounds + Extremities: no edema, no cyanosis   The results of significant diagnostics from this hospitalization (including imaging, microbiology, ancillary and laboratory) are listed below for reference.     Microbiology: Recent Results (from the past 240 hour(s))  MRSA PCR Screening     Status: None   Collection Time: 01/29/18 11:47 PM  Result Value Ref Range Status   MRSA by PCR NEGATIVE NEGATIVE Final    Comment:        The GeneXpert MRSA Assay (FDA approved for NASAL specimens only), is one component of a comprehensive MRSA colonization surveillance program. It is not intended to diagnose MRSA infection nor to guide or monitor treatment for MRSA infections. Performed at [facility], [place] [address] [place], [place] [address]   Culture, blood (routine x 2)     Status: None   Collection Time: 01/30/18  3:48 AM  Result Value Ref Range Status   Specimen Description BLOOD LEFT ANTECUBITAL   Final   Special Requests   Final    BOTTLES DRAWN AEROBIC AND ANAEROBIC Blood Culture adequate volume   Culture   Final    NO GROWTH 5 DAYS Performed at [facility], [place] [address], [place], [place] [address]    Report Status 02/04/2018 FINAL  Final  Culture, blood (routine x 2)     Status: None   Collection Time: 01/30/18  4:10 AM  Result Value Ref Range Status   Specimen Description BLOOD LEFT HAND  Final   Special Requests   Final    BOTTLES DRAWN AEROBIC AND ANAEROBIC Blood Culture adequate volume   Culture   Final    NO GROWTH 5 DAYS Performed at [facility], [place] [address] [place], [place] [address]    Report Status 02/04/2018 FINAL  Final     Labs: BNP (last 3 results) No results for input(s): BNP in the last 8760 hours. Basic Metabolic Panel: Recent Labs  Lab 01/30/18 0423 01/30/18 1347 01/31/18 0515  02/01/18 0440 02/02/18 0553 02/04/18 0930  NA 141 141 139 137 140 138  K 5.1 4.9 4.4 4.5 4.3 5.0  CL 108 109 102 100 101 96*  CO2 12* 14* 20* 19* 25 20*  GLUCOSE 101* 105* 139* 134* 125* 119*  BUN 143* 145* 82* 111* 85* 158*  CREATININE 19.14*  19.14* 19.35* 13.46* 14.99* 10.95* 14.50*  CALCIUM 5.8* 6.2* 6.8* 6.8* 7.0* 6.8*  MG 2.2  --   --   --   --   --   PHOS 8.7* 9.7*  --   --   --  5.7*   Liver Function Tests: Recent Labs  Lab 01/29/18 1705 01/30/18 0423 01/30/18 1347 02/04/18 0930  AST 35 33  --   --   ALT 11 11  --   --   ALKPHOS 82 70  --   --   BILITOT 0.7 0.6  --   --   PROT 8.2* 7.5  --   --   ALBUMIN 3.7 3.3* 3.0* 3.3*   No results for input(s): LIPASE, AMYLASE in the last 168 hours. No results for input(s): AMMONIA in the last 168 hours. CBC: Recent Labs  Lab 01/29/18 1705 01/30/18 0423 02/01/18 0440 02/04/18 0930  WBC 12.9* 13.5* 16.0* 22.0*  NEUTROABS 10.4*  --   --   --   HGB 10.7* 9.6* 9.6* 9.5*  HCT 31.0* 28.8* 28.8* 29.4*  MCV 87.8 89.2 88.9 90.5  PLT 295 267 303 370   Cardiac Enzymes: Recent Labs  Lab  01/29/18 1705 01/30/18 0423 01/30/18 1033 01/30/18 1741  CKTOTAL 268  --   --   --   TROPONINI  --  0.12* 0.10* 0.08*   BNP: Invalid input(s): POCBNP CBG: Recent Labs  Lab 01/30/18 2042  GLUCAP 135*   D-Dimer No results for input(s): DDIMER in the last 72 hours. Hgb A1c No results for input(s): HGBA1C in the last 72 hours. Lipid Profile No results for input(s): CHOL, HDL, LDLCALC, TRIG, CHOLHDL, LDLDIRECT in the last 72 hours. Thyroid function studies No results for input(s): TSH, T4TOTAL, T3FREE, THYROIDAB in the last 72 hours.  Invalid input(s): FREET3 Anemia work up No results for input(s): VITAMINB12, FOLATE, FERRITIN, TIBC, IRON, RETICCTPCT in the last 72 hours. Urinalysis No results found for: COLORURINE, APPEARANCEUR, [place], [place], [place], [place], [place], [place], PROTEINUR, UROBILINOGEN, NITRITE, LEUKOCYTESUR Sepsis Labs Invalid input(s): PROCALCITONIN,  WBC,  LACTICIDVEN Microbiology Recent Results (from the past 240 hour(s))  MRSA PCR Screening     Status: None   Collection Time: 01/29/18 11:47 PM  Result Value Ref Range Status   MRSA by PCR NEGATIVE NEGATIVE Final    Comment:        The GeneXpert MRSA Assay (FDA approved for NASAL specimens only), is one component of a comprehensive MRSA colonization surveillance program. It is not intended to diagnose MRSA infection nor to guide or monitor treatment for MRSA infections. Performed at [facility], [place] [address] [place], [place] [address]   Culture, blood (routine x 2)     Status: None   Collection Time: 01/30/18  3:48 AM  Result Value Ref Range Status   Specimen Description BLOOD LEFT ANTECUBITAL  Final   Special Requests   Final    BOTTLES DRAWN AEROBIC AND ANAEROBIC Blood Culture adequate volume   Culture   Final    NO GROWTH 5 DAYS Performed at [facility], [place] [address], [place], [place] [address]    Report Status  02/04/2018 FINAL  Final  Culture, blood (routine x  2)     Status: None   Collection Time: 01/30/18  4:10 AM  Result Value Ref Range Status   Specimen Description BLOOD LEFT HAND  Final   Special Requests   Final    BOTTLES DRAWN AEROBIC AND ANAEROBIC Blood Culture adequate volume   Culture   Final    NO GROWTH 5 DAYS Performed at [facility], [place] [address], [place], [place] [address]    Report Status 02/04/2018 FINAL  Final   Time spent: 30 min  SIGNED:   Marylu Lund, MD  [facility] 02/04/2018, 4:30 PM  If 7PM-7AM, please contact night-coverage [email or web address] Password [email or web address]

## 2018-02-04 NOTE — Progress Notes (Addendum)
[facility] ROUNDING NOTE   Subjective:   Interval History: Seen during HD. Feeling tired, but otherwise doing well. Denies fever, chills, chest pain, and shortness of breath.   Objective:  Vital signs in last 24 hours:  Temp:  [97.7 F (36.5 C)-98.7 F (37.1 C)] 97.7 F (36.5 C) (07/22 0845) Pulse Rate:  [62-84] 78 (07/22 1030) Resp:  [14-20] 16 (07/22 1030) BP: (128-175)/(66-139) 142/95 (07/22 1030) SpO2:  [97 %-100 %] 100 % (07/22 0845) Weight:  [227 lb 11.8 oz (103.3 kg)] 227 lb 11.8 oz (103.3 kg) (07/22 0845)  Weight change:  Filed Weights   02/02/18 0521 02/03/18 0500 02/04/18 0845  Weight: 221 lb 1.6 oz (100.3 kg) 218 lb 0.6 oz (98.9 kg) 227 lb 11.8 oz (103.3 kg)    Intake/Output: I/O last 3 completed shifts: In: 763 [P.O.:760; I.V.:3] Out: 50 [Urine:50]   Intake/Output this shift:  No intake/output data recorded.  General - young male in NAD  CVS- RRR, no mrg  RS- CTAB ABD- BS present soft non-distended EXT- no edema, RUE AVF    Basic Metabolic Panel: Recent Labs  Lab 01/30/18 0423 01/30/18 1347 01/31/18 0515 02/01/18 0440 02/02/18 0553 02/04/18 0930  NA 141 141 139 137 140 138  K 5.1 4.9 4.4 4.5 4.3 5.0  CL 108 109 102 100 101 96*  CO2 12* 14* 20* 19* 25 20*  GLUCOSE 101* 105* 139* 134* 125* 119*  BUN 143* 145* 82* 111* 85* 158*  CREATININE 19.14*  19.14* 19.35* 13.46* 14.99* 10.95* 14.50*  CALCIUM 5.8* 6.2* 6.8* 6.8* 7.0* 6.8*  MG 2.2  --   --   --   --   --   PHOS 8.7* 9.7*  --   --   --  5.7*    Liver Function Tests: Recent Labs  Lab 01/29/18 1705 01/30/18 0423 01/30/18 1347 02/04/18 0930  AST 35 33  --   --   ALT 11 11  --   --   ALKPHOS 82 70  --   --   BILITOT 0.7 0.6  --   --   PROT 8.2* 7.5  --   --   ALBUMIN 3.7 3.3* 3.0* 3.3*   No results for input(s): LIPASE, AMYLASE in the last 168 hours. No results for input(s): AMMONIA in the last 168 hours.  CBC: Recent Labs  Lab 01/29/18 1705 01/30/18 0423  02/01/18 0440 02/04/18 0930  WBC 12.9* 13.5* 16.0* 22.0*  NEUTROABS 10.4*  --   --   --   HGB 10.7* 9.6* 9.6* 9.5*  HCT 31.0* 28.8* 28.8* 29.4*  MCV 87.8 89.2 88.9 90.5  PLT 295 267 303 370    Cardiac Enzymes: Recent Labs  Lab 01/29/18 1705 01/30/18 0423 01/30/18 1033 01/30/18 1741  CKTOTAL 268  --   --   --   TROPONINI  --  0.12* 0.10* 0.08*    BNP: Invalid input(s): POCBNP  CBG: Recent Labs  Lab 01/30/18 2042  GLUCAP 135*    Microbiology: Results for orders placed or performed during the hospital encounter of 01/29/18  MRSA PCR Screening     Status: None   Collection Time: 01/29/18 11:47 PM  Result Value Ref Range Status   MRSA by PCR NEGATIVE NEGATIVE Final    Comment:        The GeneXpert MRSA Assay (FDA approved for NASAL specimens only), is one component of a comprehensive MRSA colonization surveillance program. It is not intended to diagnose MRSA infection nor  to guide or monitor treatment for MRSA infections. Performed at [facility], [place] [address], [place], [place] [address]   Culture, blood (routine x 2)     Status: None (Preliminary result)   Collection Time: 01/30/18  3:48 AM  Result Value Ref Range Status   Specimen Description BLOOD LEFT ANTECUBITAL  Final   Special Requests   Final    BOTTLES DRAWN AEROBIC AND ANAEROBIC Blood Culture adequate volume   Culture   Final    NO GROWTH 4 DAYS Performed at [facility], [place] [address] [place], [place] [address]    Report Status PENDING  Incomplete  Culture, blood (routine x 2)     Status: None (Preliminary result)   Collection Time: 01/30/18  4:10 AM  Result Value Ref Range Status   Specimen Description BLOOD LEFT HAND  Final   Special Requests   Final    BOTTLES DRAWN AEROBIC AND ANAEROBIC Blood Culture adequate volume   Culture   Final    NO GROWTH 4 DAYS Performed at [facility], [place] [address], [place],  [address]    Report Status PENDING  Incomplete     Coagulation Studies: No results for input(s): LABPROT, INR in the last 72 hours.  Urinalysis: No results for input(s): COLORURINE, LABSPEC, PHURINE, GLUCOSEU, HGBUR, BILIRUBINUR, KETONESUR, PROTEINUR, UROBILINOGEN, NITRITE, LEUKOCYTESUR in the last 72 hours.  Invalid input(s): APPERANCEUR    Imaging: No results found.   Medications:   . sodium chloride 10 mL/hr at 02/02/18 1817  . sodium chloride    . sodium chloride     . amLODipine  10 mg Oral Daily  . calcium acetate  2,001 mg Oral TID WC  . carvedilol  25 mg Oral BID WC  . Chlorhexidine Gluconate Cloth  6 each Topical Q0600  . escitalopram  10 mg Oral Daily  . feeding supplement (NEPRO CARB STEADY)  237 mL Oral BID BM  . heparin  5,000 Units Subcutaneous Q8H  . multivitamin  1 tablet Oral QHS  . predniSONE  30 mg Oral Q breakfast   sodium chloride, sodium chloride, acetaminophen **OR** acetaminophen, alteplase, heparin, heparin, hydrALAZINE, lidocaine (PF), lidocaine-prilocaine, ondansetron (ZOFRAN) IV, oxymetazoline, pentafluoroprop-tetrafluoroeth  Assessment/ Plan:  Pt is a11 y.o.yo malewith a PMHX of ESRD s/p renal transplant in 2014 at [facility], HTN, parathyroid hyperplasia s/p parathyroidectomy who presented withrenal transplant failure  1. Acute renal failure 2/2 non-compliance with immunosuppressants for renal transplant:onPO prednisone 30 QD.This is unfortunately probably end stage renal disease. Dr. Florene Glen discussed with pt that renal biopsy would be very low yield and therefore would not recommend. Recommend reducing prednisone to 20mg  and continue to taper.HDlast on 7/19.Plan for HD today.  Needs OP spot.   Agree that there is no need for renal biopsy 2. HER:DEYCXK. On amlodipine 10 and Coreg 25 BID 3. Hyperparathyroidism/hyper phos:Oncalcium acetate 4.Volume overload:continues to improve with HD  6. AVF RUE: functioning       Started dialysis and will need to continue will need outpatient spot         LOS: 6 Idalys Santos-Sanchez 02/04/2018 10:50 AM

## 2018-02-04 NOTE — Care Management (Signed)
ED CM received call from Charge nurse on 5W concerning patient being discharged with medication assistance.  Reviewed record patient is eligible for [facility] program patient enrolled in [facility] letter printer and faxed to [place] station with  Instructions. Call 5W confirmed receipt of Medication Assistance  Letter.

## 2018-02-04 NOTE — Progress Notes (Signed)
Update schedule change . Monday,Wednesday,Friday 12 noon  Need to be there at 11:30am on 06 February 2018

## 2018-02-04 NOTE — Progress Notes (Signed)
Jerry [name] letter given to pt to assist in rx cost

## 2018-02-05 ENCOUNTER — Encounter (HOSPITAL_COMMUNITY): Payer: Self-pay | Admitting: Emergency Medicine

## 2018-02-05 ENCOUNTER — Other Ambulatory Visit: Payer: Self-pay

## 2018-02-05 ENCOUNTER — Emergency Department (HOSPITAL_COMMUNITY)
Admission: EM | Admit: 2018-02-05 | Discharge: 2018-02-05 | Disposition: A | Discharge status: Home / Self Care | Payer: Medicare Other | Attending: Emergency Medicine | Admitting: Emergency Medicine

## 2018-02-05 DIAGNOSIS — N186 End stage renal disease: Secondary | ICD-10-CM | POA: Diagnosis not present

## 2018-02-05 DIAGNOSIS — I120 Hypertensive chronic kidney disease with stage 5 chronic kidney disease or end stage renal disease: Secondary | ICD-10-CM | POA: Insufficient documentation

## 2018-02-05 DIAGNOSIS — D509 Iron deficiency anemia, unspecified: Secondary | ICD-10-CM | POA: Insufficient documentation

## 2018-02-05 DIAGNOSIS — F17290 Nicotine dependence, other tobacco product, uncomplicated: Secondary | ICD-10-CM | POA: Insufficient documentation

## 2018-02-05 DIAGNOSIS — T82838A Hemorrhage of vascular prosthetic devices, implants and grafts, initial encounter: Secondary | ICD-10-CM | POA: Diagnosis present

## 2018-02-05 DIAGNOSIS — I158 Other secondary hypertension: Secondary | ICD-10-CM | POA: Insufficient documentation

## 2018-02-05 DIAGNOSIS — Y828 Other medical devices associated with adverse incidents: Secondary | ICD-10-CM | POA: Insufficient documentation

## 2018-02-05 DIAGNOSIS — D631 Anemia in chronic kidney disease: Secondary | ICD-10-CM | POA: Insufficient documentation

## 2018-02-05 DIAGNOSIS — N2581 Secondary hyperparathyroidism of renal origin: Secondary | ICD-10-CM | POA: Insufficient documentation

## 2018-02-05 NOTE — ED Notes (Signed)
Pt states he has had diminished appetite today

## 2018-02-05 NOTE — ED Provider Notes (Signed)
[facility] EMERGENCY DEPARTMENT Provider Note   CSN: [id number] Arrival date & time: 02/05/18  1417     History   Chief Complaint Chief Complaint  Patient presents with  . Vascular Access Problem    HPI Jerry [name] is a 47 y.o. male with a history of acute renal failure and hypertensive urgency who presents to the emergency department with a chief complaint of right AV fistula bleeding.  The patient reports that he was restarted on hemodialysis on 01/30/2018 after not requiring use of his fistula for several years.  He is currently on a Monday, Wednesday, Friday hemodialysis schedule.  He was last dialyzed yesterday and underwent a full session while he was admitted to the hospital.  He was discharged later in the day.  When he got home, he reports  the right AV fistula began gushing blood.  He did not have any gauze to apply pressure so he tried to wrap the area with a maxi pad.  He reports that he filled the first pad when he awoke this morning.  He placed a second pad which she bled through by the time he got to the emergency department.  He states that he was feeling lightheaded earlier in the day, which is since resolved.  The wound was dressed in the triage area when he arrived to the ED.  He denies syncope or dyspnea.  He was given heparin while he was admitted in the hospital.  He does not take any other blood thinners.  No history of bleeding from the AV fistula prior to yesterday.  The history is provided by the patient. No language interpreter was used.    Past Medical History:  Diagnosis Date  . Complication of anesthesia    "benadryl knocks me out; worse than any normal reactions; anesthesia/numbing RX wear off FAST" (01/30/2018)  . ESRD (end stage renal disease) on dialysis ([place])    "HAD DIALYSIS BEFORE TRANSPLANT; RESTARTED DIALYSIS 01/30/2018)  . Hypertension   . OSA on CPAP   . Renal disorder     Patient Active Problem List   Diagnosis Date  Noted  . ARF (acute renal failure) ([place]) 01/29/2018  . Hypertensive urgency 01/29/2018  . Anemia 01/29/2018    Past Surgical History:  Procedure Laterality Date  . AV FISTULA PLACEMENT Right 2014  . KIDNEY TRANSPLANT  2014   at [facility]  . PARATHYROIDECTOMY  2014   at [place] Medications    Prior to Admission medications   Medication Sig Start Date End Date Taking? Authorizing Provider  amLODipine (NORVASC) 10 MG tablet Take 1 tablet (10 mg total) by mouth daily. 02/05/18  Yes Donne Hazel, MD  calcium acetate (PHOSLO) 667 MG capsule Take 3 capsules (2,001 mg total) by mouth 3 (three) times daily with meals. 02/04/18 03/06/18 Yes Donne Hazel, MD  carvedilol (COREG) 25 MG tablet Take 1 tablet (25 mg total) by mouth 2 (two) times daily with a meal. 02/04/18 03/06/18 Yes Donne Hazel, MD  escitalopram (LEXAPRO) 20 MG tablet Take 10 mg by mouth daily.    Yes [provider]  multivitamin (RENA-VIT) TABS tablet Take 1 tablet by mouth at bedtime. 02/04/18 03/06/18 Yes Donne Hazel, MD  predniSONE (DELTASONE) 10 MG tablet Take 1 tablet (10 mg total) by mouth daily with breakfast. 02/04/18  Yes Donne Hazel, MD    Family History Family History  Problem Relation Age of Onset  .  Heart failure Mother   . Kidney failure Father     Social History Social History   Tobacco Use  . Smoking status: Current Some Day Smoker    Years: 4.00    Types: Cigars  . Smokeless tobacco: Never Used  Substance Use Topics  . Alcohol use: Yes    Alcohol/week: 1.2 oz    Types: 2 Standard drinks or equivalent per week  . Drug use: Not Currently     Allergies   Patient has no known allergies.   Review of Systems Review of Systems  Constitutional: Negative for appetite change, chills and fever.  Respiratory: Negative for shortness of breath.   Cardiovascular: Negative for chest pain.  Gastrointestinal: Negative for abdominal pain, diarrhea, nausea and vomiting.    Genitourinary: Negative for dysuria.  Musculoskeletal: Negative for back pain.  Skin: Positive for wound. Negative for color change and rash.  Allergic/Immunologic: Negative for immunocompromised state.  Neurological: Negative for weakness, numbness and headaches.  Psychiatric/Behavioral: Negative for confusion.   Physical Exam Updated Vital Signs BP (!) 153/99 (BP Location: Left Arm)   Pulse 65   Temp 97.7 F (36.5 C) (Oral)   Resp 19   SpO2 100%   Physical Exam  Constitutional: He appears well-developed.  HENT:  Head: Normocephalic.  Eyes: Conjunctivae are normal.  Neck: Neck supple.  Cardiovascular: Normal rate and regular rhythm.  No murmur heard. Pulmonary/Chest: Effort normal.  Abdominal: Soft. He exhibits no distension.  Musculoskeletal:  Palpable thrill to the right radial pulse.  There is an actively oozing wound to the proximal portion of the right AV fistula.  There is a second area near the distal portion of the wound that was previously bleeding but is now hemostatic.  Neurological: He is alert.  Skin: Skin is warm and dry.  Psychiatric: His behavior is normal.  Nursing note and vitals reviewed.  ED Treatments / Results  Labs (all labs ordered are listed, but only abnormal results are displayed) Labs Reviewed - No data to display  EKG None  Radiology No results found.  Procedures Procedures (including critical care time)  Medications Ordered in ED Medications - No data to display   Initial Impression / Assessment and Plan / ED Course  I have reviewed the triage vital signs and the nursing notes.  Pertinent labs & imaging results that were available during my care of the patient were reviewed by me and considered in my medical decision making (see chart for details).     47 year old male with a history of acute renal failure and hypertensive urgency presented to the emergency department with a chief complaint of bleeding from his right AV  fistula.  The patient was  restarted on hemodialysis on 01/30/2018 after not having use the fistula for several years.  On exam, mild oozing was noted at the proximal portion of the fistula.  There was an area that he reports that was previously bleeding but that was now hemostatic near the distal portion.  Combat gauze applied.  After 3 minutes, wound was hemostatic.  The patient was seen and evaluated along with Dr. Kathrynn Humble, attending physician.  Combat gauze was reapplied and the patient was instructed to leave it in place until tomorrow morning.  Strict return precautions given.  He is hemodynamically stable ambulatory without difficulty or lightheadedness and in no acute distress.  He is safe for discharge to home with outpatient follow-up to dialysis tomorrow morning.  Final Clinical Impressions(s) / ED Diagnoses   Final  diagnoses:  Bleeding from dialysis shunt, initial encounter [facility])    ED Discharge Orders    None       Joanne Gavel, PA-C 02/05/18 2209    Varney Biles, MD 02/06/18 [phone]

## 2018-02-05 NOTE — Discharge Instructions (Signed)
Thank you for allowing me to care for you today in the Emergency Department.   Keep your dialysis appointment tomorrow.  If this site starts to bleed at home, you can apply firm pressure with a cloth, gauze for at least 3 to 10 minutes.  If bleeding begins immediately after dialysis, you may have to apply pressure for longer.  Try to elevate your arm above the level of your heart to help bleeding.  You should remove the dressing on her arm if you start to not be able to fill your fingers or gets tingly.  Return to the emergency department if you cannot get the area to stop bleeding despite these precautions at home.

## 2018-02-05 NOTE — ED Notes (Signed)
Patient verbalizes understanding of discharge instructions. Opportunity for questioning and answers were provided. Armband removed by staff, pt discharged from ED.  

## 2018-02-05 NOTE — ED Provider Notes (Signed)
Patient placed in Quick Look pathway, seen and evaluated   Chief Complaint: bleeding from dialysis site HPI:   Pt had dialysis yesterday  ROS: no fever no chills  Physical Exam:   Gen: No distress  Neuro: Awake and Alert  Skin: Warm    Focused Exam: pressure dressing over site   Initiation of care has begun. The patient has been counseled on the process, plan, and necessity for staying for the completion/evaluation, and the remainder of the medical screening examination   Sidney Ace 02/05/18 1450    Julianne Rice, MD 02/07/18 [phone]

## 2018-02-05 NOTE — ED Triage Notes (Signed)
Dialysis patient with AV fistula in right forearm, had normal treatment yesterday. Fistula suddenly started bleeding yesterday. Hemorrhage controlled at this time.

## 2018-02-05 NOTE — ED Notes (Signed)
ED Provider at bedside. 

## 2018-02-05 NOTE — ED Notes (Signed)
Fistula cleaned of blood and re dressed after blood had clotted

## 2018-11-14 DIAGNOSIS — Z4802 Encounter for removal of sutures: Secondary | ICD-10-CM | POA: Insufficient documentation

## 2019-01-17 DIAGNOSIS — A499 Bacterial infection, unspecified: Secondary | ICD-10-CM | POA: Insufficient documentation

## 2019-04-09 DIAGNOSIS — Z23 Encounter for immunization: Secondary | ICD-10-CM | POA: Insufficient documentation

## 2019-04-25 DIAGNOSIS — N390 Urinary tract infection, site not specified: Secondary | ICD-10-CM | POA: Insufficient documentation

## 2019-05-02 ENCOUNTER — Other Ambulatory Visit: Payer: Self-pay | Admitting: Physician Assistant

## 2019-05-02 DIAGNOSIS — R109 Unspecified abdominal pain: Secondary | ICD-10-CM

## 2019-05-08 ENCOUNTER — Ambulatory Visit
Admission: RE | Admit: 2019-05-08 | Discharge: 2019-05-08 | Disposition: A | Discharge status: Home / Self Care | Payer: Medicare Other | Source: Ambulatory Visit | Attending: Physician Assistant | Admitting: Physician Assistant

## 2019-05-08 DIAGNOSIS — R109 Unspecified abdominal pain: Secondary | ICD-10-CM

## 2019-05-09 DIAGNOSIS — R0602 Shortness of breath: Secondary | ICD-10-CM | POA: Insufficient documentation

## 2019-05-16 DIAGNOSIS — R519 Headache, unspecified: Secondary | ICD-10-CM | POA: Insufficient documentation

## 2019-06-13 DIAGNOSIS — E8770 Fluid overload, unspecified: Secondary | ICD-10-CM | POA: Insufficient documentation

## 2019-12-11 ENCOUNTER — Emergency Department (HOSPITAL_COMMUNITY)
Admission: EM | Admit: 2019-12-11 | Discharge: 2019-12-11 | Disposition: A | Discharge status: Home / Self Care | Payer: Medicare (Managed Care) | Attending: Emergency Medicine | Admitting: Emergency Medicine

## 2019-12-11 ENCOUNTER — Encounter (HOSPITAL_COMMUNITY): Payer: Self-pay

## 2019-12-11 DIAGNOSIS — F17290 Nicotine dependence, other tobacco product, uncomplicated: Secondary | ICD-10-CM | POA: Insufficient documentation

## 2019-12-11 DIAGNOSIS — I120 Hypertensive chronic kidney disease with stage 5 chronic kidney disease or end stage renal disease: Secondary | ICD-10-CM | POA: Diagnosis not present

## 2019-12-11 DIAGNOSIS — Z992 Dependence on renal dialysis: Secondary | ICD-10-CM | POA: Diagnosis not present

## 2019-12-11 DIAGNOSIS — T82838A Hemorrhage of vascular prosthetic devices, implants and grafts, initial encounter: Secondary | ICD-10-CM | POA: Insufficient documentation

## 2019-12-11 DIAGNOSIS — R58 Hemorrhage, not elsewhere classified: Secondary | ICD-10-CM

## 2019-12-11 DIAGNOSIS — I770 Arteriovenous fistula, acquired: Secondary | ICD-10-CM | POA: Diagnosis present

## 2019-12-11 DIAGNOSIS — Z940 Kidney transplant status: Secondary | ICD-10-CM | POA: Insufficient documentation

## 2019-12-11 DIAGNOSIS — R42 Dizziness and giddiness: Secondary | ICD-10-CM | POA: Diagnosis not present

## 2019-12-11 DIAGNOSIS — N186 End stage renal disease: Secondary | ICD-10-CM | POA: Diagnosis not present

## 2019-12-11 DIAGNOSIS — Z79899 Other long term (current) drug therapy: Secondary | ICD-10-CM | POA: Insufficient documentation

## 2019-12-11 LAB — CBC WITH DIFFERENTIAL/PLATELET
Abs Immature Granulocytes: 0.12 10*3/uL — ABNORMAL HIGH (ref 0.00–0.07)
Basophils Absolute: 0.1 10*3/uL (ref 0.0–0.1)
Basophils Relative: 1 %
Eosinophils Absolute: 0.7 10*3/uL — ABNORMAL HIGH (ref 0.0–0.5)
Eosinophils Relative: 6 %
HCT: 36.2 % — ABNORMAL LOW (ref 39.0–52.0)
Hemoglobin: 10.9 g/dL — ABNORMAL LOW (ref 13.0–17.0)
Immature Granulocytes: 1 %
Lymphocytes Relative: 16 %
Lymphs Abs: 1.8 10*3/uL (ref 0.7–4.0)
MCH: 29.7 pg (ref 26.0–34.0)
MCHC: 30.1 g/dL (ref 30.0–36.0)
MCV: 98.6 fL (ref 80.0–100.0)
Monocytes Absolute: 0.8 10*3/uL (ref 0.1–1.0)
Monocytes Relative: 7 %
Neutro Abs: 8.1 10*3/uL — ABNORMAL HIGH (ref 1.7–7.7)
Neutrophils Relative %: 69 %
Platelets: 436 10*3/uL — ABNORMAL HIGH (ref 150–400)
RBC: 3.67 MIL/uL — ABNORMAL LOW (ref 4.22–5.81)
RDW: 15.8 % — ABNORMAL HIGH (ref 11.5–15.5)
WBC: 11.6 10*3/uL — ABNORMAL HIGH (ref 4.0–10.5)
nRBC: 0 % (ref 0.0–0.2)

## 2019-12-11 LAB — BASIC METABOLIC PANEL
Anion gap: 20 — ABNORMAL HIGH (ref 5–15)
BUN: 62 mg/dL — ABNORMAL HIGH (ref 6–20)
CO2: 23 mmol/L (ref 22–32)
Calcium: 7 mg/dL — ABNORMAL LOW (ref 8.9–10.3)
Chloride: 94 mmol/L — ABNORMAL LOW (ref 98–111)
Creatinine, Ser: 11.5 mg/dL — ABNORMAL HIGH (ref 0.61–1.24)
GFR calc Af Amer: 5 mL/min — ABNORMAL LOW (ref >60)
GFR calc non Af Amer: 5 mL/min — ABNORMAL LOW (ref >60)
Glucose, Bld: 117 mg/dL — ABNORMAL HIGH (ref 70–99)
Potassium: 4.6 mmol/L (ref 3.5–5.1)
Sodium: 137 mmol/L (ref 135–145)

## 2019-12-11 LAB — PROTIME-INR
INR: 1 (ref 0.8–1.2)
Prothrombin Time: 12.3 seconds (ref 11.4–15.2)

## 2019-12-11 LAB — MAGNESIUM: Magnesium: 2.2 mg/dL (ref 1.7–2.4)

## 2019-12-11 NOTE — ED Provider Notes (Signed)
Jerry [name] Provider Note   CSN: [id number] Arrival date & time: 12/11/19  1900     History Chief Complaint  Patient presents with  . Fistula Problem    Jerry [name] is a 49 y.o. male.  HPI Patient is a 49 year old male who presents for fistula bleeding.  He states that he has had complications with his distal right arm fistula before.  He says there was infection there.  The area has bled before, when he picked the scab off of it.  He most recently saw vascular surgery on Monday.  He currently still gets dialysis near that site.  Last dialysis was yesterday.  Today, he had the area bandaged.  Around 9 or 10 this morning, he noticed a small spot of blood on the bandage.  When he removed the bandage, he noticed pulsatile bleeding from the site.  He held pressure.  He called EMS.  EMS arrived on scene and also held pressure for a while.  When he removed the bandage again, pulsatile bleeding was again appreciated.  Initial blood pressures during transit were in the 120s.  Upon arrival, blood pressure is 92/34.  Patient does endorse some lightheadedness.    Past Medical History:  Diagnosis Date  . Complication of anesthesia    "benadryl knocks me out; worse than any normal reactions; anesthesia/numbing RX wear off FAST" (01/30/2018)  . ESRD (end stage renal disease) on dialysis ([place])    "HAD DIALYSIS BEFORE TRANSPLANT; RESTARTED DIALYSIS 01/30/2018)  . Hypertension   . OSA on CPAP   . Renal disorder     Patient Active Problem List   Diagnosis Date Noted  . ARF (acute renal failure) ([place]) 01/29/2018  . Hypertensive urgency 01/29/2018  . Anemia 01/29/2018    Past Surgical History:  Procedure Laterality Date  . AV FISTULA PLACEMENT Right 2014  . KIDNEY TRANSPLANT  2014   at [facility]  . PARATHYROIDECTOMY  2014   at [facility]       Family History  Problem Relation Age of Onset  . Heart failure Mother   . Kidney failure Father     Social  History   Tobacco Use  . Smoking status: Current Some Day Smoker    Years: 4.00    Types: Cigars  . Smokeless tobacco: Never Used  Substance Use Topics  . Alcohol use: Yes    Alcohol/week: 2.0 standard drinks    Types: 2 Standard drinks or equivalent per week  . Drug use: Not Currently    Home Medications Prior to Admission medications   Medication Sig Start Date End Date Taking? Authorizing Provider  amLODipine (NORVASC) 10 MG tablet Take 1 tablet (10 mg total) by mouth daily. 02/05/18   Donne Hazel, MD  carvedilol (COREG) 25 MG tablet Take 1 tablet (25 mg total) by mouth 2 (two) times daily with a meal. 02/04/18 03/06/18  Donne Hazel, MD  escitalopram (LEXAPRO) 20 MG tablet Take 10 mg by mouth daily.     [provider]  predniSONE (DELTASONE) 10 MG tablet Take 1 tablet (10 mg total) by mouth daily with breakfast. 02/04/18   Donne Hazel, MD    Allergies    Patient has no known allergies.  Review of Systems   Review of Systems  Constitutional: Negative for chills and fever.  HENT: Negative for ear pain and sore throat.   Eyes: Negative for pain and visual disturbance.  Respiratory: Negative for cough and shortness of breath.  Cardiovascular: Negative for chest pain and palpitations.  Gastrointestinal: Negative for abdominal pain and vomiting.  Genitourinary: Negative for dysuria and hematuria.  Musculoskeletal: Negative for arthralgias and back pain.  Skin: Positive for wound. Negative for color change and rash.  Neurological: Positive for light-headedness. Negative for dizziness, seizures, syncope, weakness, numbness and headaches.  Hematological: Negative.   Psychiatric/Behavioral: Negative.  Negative for confusion and decreased concentration.  All other systems reviewed and are negative.   Physical Exam Updated Vital Signs BP 104/81   Pulse 93   Temp 98.1 F (36.7 C) (Oral)   Resp 18   SpO2 96%   Physical Exam Vitals and nursing note  reviewed.  Constitutional:      General: He is not in acute distress.    Appearance: Normal appearance. He is well-developed. He is obese. He is not ill-appearing, toxic-appearing or diaphoretic.  HENT:     Head: Normocephalic and atraumatic.     Right Ear: External ear normal.     Left Ear: External ear normal.     Nose: Nose normal.     Mouth/Throat:     Mouth: Mucous membranes are moist.     Pharynx: Oropharynx is clear.  Eyes:     General: No scleral icterus.    Conjunctiva/sclera: Conjunctivae normal.  Cardiovascular:     Rate and Rhythm: Regular rhythm. Bradycardia present.     Heart sounds: No murmur.     Comments: Gauze placed over current dialysis site.  Just distal to that site is his area of bleeding, which has a Band-Aid over it.  Entire area is covered with Tegaderm.  There is dried blood present under Tegaderm.  Thrill is present.  There is no active bleeding. Pulmonary:     Effort: Pulmonary effort is normal. No respiratory distress.     Breath sounds: Normal breath sounds. No wheezing.  Abdominal:     Palpations: Abdomen is soft.     Tenderness: There is no abdominal tenderness. There is no guarding.  Musculoskeletal:        General: No swelling, tenderness or deformity.     Cervical back: Neck supple. No tenderness.     Right lower leg: No edema.     Left lower leg: No edema.  Skin:    General: Skin is warm and dry.     Coloration: Skin is not jaundiced.     Findings: No bruising.  Neurological:     General: No focal deficit present.     Mental Status: He is alert and oriented to person, place, and time.     Cranial Nerves: No cranial nerve deficit.     Sensory: No sensory deficit.     Motor: No weakness.  Psychiatric:        Mood and Affect: Mood normal.        Behavior: Behavior normal.        Thought Content: Thought content normal.        Judgment: Judgment normal.     ED Results / Procedures / Treatments   Labs (all labs ordered are listed, but  only abnormal results are displayed) Labs Reviewed  CBC WITH DIFFERENTIAL/PLATELET - Abnormal; Notable for the following components:      Result Value   WBC 11.6 (*)    RBC 3.67 (*)    Hemoglobin 10.9 (*)    HCT 36.2 (*)    RDW 15.8 (*)    Platelets 436 (*)    Neutro Abs 8.1 (*)  Eosinophils Absolute 0.7 (*)    Abs Immature Granulocytes 0.12 (*)    All other components within normal limits  BASIC METABOLIC PANEL - Abnormal; Notable for the following components:   Chloride 94 (*)    Glucose, Bld 117 (*)    BUN 62 (*)    Creatinine, Ser 11.50 (*)    Calcium 7.0 (*)    GFR calc non Af Amer 5 (*)    GFR calc Af Amer 5 (*)    Anion gap 20 (*)    All other components within normal limits  MAGNESIUM  PROTIME-INR    EKG None  Radiology No results found.  Procedures Procedures (including critical care time)  Medications Ordered in ED Medications - No data to display  ED Course  I have reviewed the triage vital signs and the nursing notes.  Pertinent labs & imaging results that were available during my care of the patient were reviewed by me and considered in my medical decision making (see chart for details).    MDM Rules/Calculators/A&P                      Patient is a 49 year old male with history of ESRD, currently getting dialysis, MWF, with last session yesterday, who presents for fistula bleeding.  Fistula is located on distal right arm.  He has had an area of infection there in the past.  He is currently getting dialysis just proximal to this area.  Area of previous infection has bled before.  Today is a recurrence of that bleeding.  He saw vascular surgery 3 days ago.    Upon arrival in the ED, he appears well.  His blood pressure is low, in the range of 90/50.  He has no areas of acute pain.  Area of fistula was wrapped in Kerlix gauze.  He is holding pressure on it.  Labs ordered to include CBC, INR, and electrolytes.  Gauze was removed from area of fistula.   Area of fistula had gauze over his current dialysis site, Band-Aid over his previous site (the site that has bled), and entire area was covered with a large piece of Tegaderm.  Area is now hemostatic.  There is darkened, dried blood underneath the Tegaderm.  Given that the bleed has now clotted, Tegaderm and underlying gauze/bandage were left in place.  Labs showed baseline hemoglobin and no abnormalities in platelets or clotting factors.  While in the ED, blood pressure normalized.  Patient reports that he lost a minimal amount of blood today.  Initial low blood pressure is likely secondary to poor cuff placement and/or vagal response.  Without interventions, patient's blood pressure and heart rate normalized.  It stayed normal during 2-hour observation in the ED.  Patient's area of fistula also remained hemostatic.  At this point, patient is suitable for discharge home.  He will undergo dialysis tomorrow.  Area fistula was wrapped in Kerlix gauze, for protection.  He was advised to return to the ED for any recurrence of bleeding.  He was discharged home in stable condition.  Final Clinical Impression(s) / ED Diagnoses Final diagnoses:  Bleeding    Rx / DC Orders ED Discharge Orders    None       Godfrey Pick, MD 12/12/19 [place], [place], DO 12/12/19 1611

## 2019-12-11 NOTE — ED Triage Notes (Signed)
Pt comes goes via [facility] from home, fistula started bleeding today, last received treatment yesterday. Pressure dressing applied

## 2019-12-26 ENCOUNTER — Encounter (HOSPITAL_COMMUNITY)
Admission: EM | Disposition: A | Discharge status: Home / Self Care | Payer: Self-pay | Source: Home / Self Care | Attending: Emergency Medicine

## 2019-12-26 ENCOUNTER — Other Ambulatory Visit: Payer: Self-pay

## 2019-12-26 ENCOUNTER — Emergency Department (HOSPITAL_COMMUNITY): Payer: Medicare (Managed Care) | Admitting: Certified Registered Nurse Anesthetist

## 2019-12-26 ENCOUNTER — Ambulatory Visit (HOSPITAL_COMMUNITY)
Admission: EM | Admit: 2019-12-26 | Discharge: 2019-12-26 | Disposition: A | Discharge status: Home / Self Care | Payer: Medicare (Managed Care) | Attending: Emergency Medicine | Admitting: Emergency Medicine

## 2019-12-26 ENCOUNTER — Encounter (HOSPITAL_COMMUNITY): Payer: Self-pay | Admitting: Emergency Medicine

## 2019-12-26 DIAGNOSIS — L98499 Non-pressure chronic ulcer of skin of other sites with unspecified severity: Secondary | ICD-10-CM | POA: Insufficient documentation

## 2019-12-26 DIAGNOSIS — I120 Hypertensive chronic kidney disease with stage 5 chronic kidney disease or end stage renal disease: Secondary | ICD-10-CM | POA: Insufficient documentation

## 2019-12-26 DIAGNOSIS — G4733 Obstructive sleep apnea (adult) (pediatric): Secondary | ICD-10-CM | POA: Insufficient documentation

## 2019-12-26 DIAGNOSIS — Z20822 Contact with and (suspected) exposure to covid-19: Secondary | ICD-10-CM | POA: Insufficient documentation

## 2019-12-26 DIAGNOSIS — E890 Postprocedural hypothyroidism: Secondary | ICD-10-CM | POA: Insufficient documentation

## 2019-12-26 DIAGNOSIS — Y832 Surgical operation with anastomosis, bypass or graft as the cause of abnormal reaction of the patient, or of later complication, without mention of misadventure at the time of the procedure: Secondary | ICD-10-CM | POA: Diagnosis not present

## 2019-12-26 DIAGNOSIS — Z8249 Family history of ischemic heart disease and other diseases of the circulatory system: Secondary | ICD-10-CM | POA: Diagnosis not present

## 2019-12-26 DIAGNOSIS — Z841 Family history of disorders of kidney and ureter: Secondary | ICD-10-CM | POA: Insufficient documentation

## 2019-12-26 DIAGNOSIS — Z940 Kidney transplant status: Secondary | ICD-10-CM | POA: Diagnosis not present

## 2019-12-26 DIAGNOSIS — F17290 Nicotine dependence, other tobacco product, uncomplicated: Secondary | ICD-10-CM | POA: Diagnosis not present

## 2019-12-26 DIAGNOSIS — N186 End stage renal disease: Secondary | ICD-10-CM | POA: Diagnosis not present

## 2019-12-26 DIAGNOSIS — Z992 Dependence on renal dialysis: Secondary | ICD-10-CM | POA: Diagnosis not present

## 2019-12-26 DIAGNOSIS — T82898A Other specified complication of vascular prosthetic devices, implants and grafts, initial encounter: Secondary | ICD-10-CM | POA: Diagnosis not present

## 2019-12-26 DIAGNOSIS — Z79899 Other long term (current) drug therapy: Secondary | ICD-10-CM | POA: Diagnosis not present

## 2019-12-26 DIAGNOSIS — T82838A Hemorrhage of vascular prosthetic devices, implants and grafts, initial encounter: Secondary | ICD-10-CM | POA: Diagnosis present

## 2019-12-26 HISTORY — PX: AV FISTULA PLACEMENT: SHX1204

## 2019-12-26 LAB — CBC
HCT: 36.7 % — ABNORMAL LOW (ref 39.0–52.0)
Hemoglobin: 10.9 g/dL — ABNORMAL LOW (ref 13.0–17.0)
MCH: 28.9 pg (ref 26.0–34.0)
MCHC: 29.7 g/dL — ABNORMAL LOW (ref 30.0–36.0)
MCV: 97.3 fL (ref 80.0–100.0)
Platelets: 405 10*3/uL — ABNORMAL HIGH (ref 150–400)
RBC: 3.77 MIL/uL — ABNORMAL LOW (ref 4.22–5.81)
RDW: 15.6 % — ABNORMAL HIGH (ref 11.5–15.5)
WBC: 7.7 10*3/uL (ref 4.0–10.5)
nRBC: 0 % (ref 0.0–0.2)

## 2019-12-26 LAB — SARS CORONAVIRUS 2 BY RT PCR (HOSPITAL ORDER, PERFORMED IN ~~LOC~~ HOSPITAL LAB): SARS Coronavirus 2: NEGATIVE

## 2019-12-26 LAB — BASIC METABOLIC PANEL
Anion gap: 19 — ABNORMAL HIGH (ref 5–15)
BUN: 66 mg/dL — ABNORMAL HIGH (ref 6–20)
CO2: 22 mmol/L (ref 22–32)
Calcium: 7.4 mg/dL — ABNORMAL LOW (ref 8.9–10.3)
Chloride: 98 mmol/L (ref 98–111)
Creatinine, Ser: 11.76 mg/dL — ABNORMAL HIGH (ref 0.61–1.24)
GFR calc Af Amer: 5 mL/min — ABNORMAL LOW (ref >60)
GFR calc non Af Amer: 4 mL/min — ABNORMAL LOW (ref >60)
Glucose, Bld: 107 mg/dL — ABNORMAL HIGH (ref 70–99)
Potassium: 5.2 mmol/L — ABNORMAL HIGH (ref 3.5–5.1)
Sodium: 139 mmol/L (ref 135–145)

## 2019-12-26 LAB — SAMPLE TO BLOOD BANK

## 2019-12-26 SURGERY — ARTERIOVENOUS (AV) FISTULA CREATION
Anesthesia: General | Site: Arm Lower | Laterality: Right

## 2019-12-26 MED ORDER — FENTANYL CITRATE (PF) 100 MCG/2ML IJ SOLN
INTRAMUSCULAR | Status: AC
  Filled 2019-12-26: qty 2

## 2019-12-26 MED ORDER — BUPIVACAINE-EPINEPHRINE 0.5% -1:200000 IJ SOLN
INTRAMUSCULAR | Status: AC
  Filled 2019-12-26: qty 1

## 2019-12-26 MED ORDER — NEOSTIGMINE METHYLSULFATE 10 MG/10ML IV SOLN
INTRAVENOUS | Status: DC | PRN
  Administered 2019-12-26: 4 mg via INTRAVENOUS

## 2019-12-26 MED ORDER — ROCURONIUM BROMIDE 10 MG/ML (PF) SYRINGE
PREFILLED_SYRINGE | INTRAVENOUS | Status: DC | PRN
  Administered 2019-12-26: 30 mg via INTRAVENOUS

## 2019-12-26 MED ORDER — SODIUM CHLORIDE 0.9 % IV SOLN
INTRAVENOUS | Status: DC | PRN
Start: 2019-12-26 — End: 2019-12-26

## 2019-12-26 MED ORDER — LIDOCAINE 2% (20 MG/ML) 5 ML SYRINGE
INTRAMUSCULAR | Status: DC | PRN
  Administered 2019-12-26: 60 mg via INTRAVENOUS

## 2019-12-26 MED ORDER — PHENYLEPHRINE HCL-NACL 10-0.9 MG/250ML-% IV SOLN
INTRAVENOUS | Status: DC | PRN
  Administered 2019-12-26: 50 ug/min via INTRAVENOUS

## 2019-12-26 MED ORDER — OXYCODONE HCL 5 MG PO TABS
ORAL_TABLET | ORAL | Status: AC
  Filled 2019-12-26: qty 1

## 2019-12-26 MED ORDER — MORPHINE SULFATE (PF) 4 MG/ML IV SOLN
4.0000 mg | Freq: Once | INTRAVENOUS | Status: AC
  Administered 2019-12-26: 4 mg via INTRAVENOUS
  Filled 2019-12-26: qty 1

## 2019-12-26 MED ORDER — SODIUM CHLORIDE 0.9 % IV SOLN
INTRAVENOUS | Status: AC
  Filled 2019-12-26: qty 1.2

## 2019-12-26 MED ORDER — OXYCODONE-ACETAMINOPHEN 5-325 MG PO TABS: 1.0000 | ORAL_TABLET | Freq: Four times a day (QID) | ORAL | 0 refills | Status: DC | PRN

## 2019-12-26 MED ORDER — FENTANYL CITRATE (PF) 250 MCG/5ML IJ SOLN
INTRAMUSCULAR | Status: DC | PRN
  Administered 2019-12-26: 100 ug via INTRAVENOUS

## 2019-12-26 MED ORDER — SODIUM CHLORIDE 0.9 % IV SOLN
INTRAVENOUS | Status: DC | PRN
  Administered 2019-12-26: 500 mL

## 2019-12-26 MED ORDER — GLYCOPYRROLATE 0.2 MG/ML IJ SOLN
INTRAMUSCULAR | Status: DC | PRN
  Administered 2019-12-26: .4 mg via INTRAVENOUS

## 2019-12-26 MED ORDER — PROPOFOL 10 MG/ML IV BOLUS
INTRAVENOUS | Status: DC | PRN
  Administered 2019-12-26: 20 mg via INTRAVENOUS
  Administered 2019-12-26: 150 mg via INTRAVENOUS

## 2019-12-26 MED ORDER — MIDAZOLAM HCL 2 MG/2ML IJ SOLN
INTRAMUSCULAR | Status: DC | PRN
  Administered 2019-12-26: 2 mg via INTRAVENOUS

## 2019-12-26 MED ORDER — 0.9 % SODIUM CHLORIDE (POUR BTL) OPTIME
TOPICAL | Status: DC | PRN
  Administered 2019-12-26: 1000 mL

## 2019-12-26 MED ORDER — MIDAZOLAM HCL 2 MG/2ML IJ SOLN
INTRAMUSCULAR | Status: AC
  Filled 2019-12-26: qty 2

## 2019-12-26 MED ORDER — FENTANYL CITRATE (PF) 250 MCG/5ML IJ SOLN
INTRAMUSCULAR | Status: AC
  Filled 2019-12-26: qty 5

## 2019-12-26 MED ORDER — CEFAZOLIN SODIUM-DEXTROSE 2-3 GM-%(50ML) IV SOLR
INTRAVENOUS | Status: DC | PRN
  Administered 2019-12-26: 2 g via INTRAVENOUS

## 2019-12-26 MED ORDER — OXYCODONE HCL 5 MG PO TABS
5.0000 mg | ORAL_TABLET | Freq: Once | ORAL | Status: AC
  Administered 2019-12-26: 5 mg via ORAL

## 2019-12-26 MED ORDER — VASOPRESSIN 20 UNIT/ML IV SOLN
INTRAVENOUS | Status: DC | PRN
Start: 2019-12-26 — End: 2019-12-26
  Administered 2019-12-26: 1 [IU] via INTRAVENOUS

## 2019-12-26 MED ORDER — FENTANYL CITRATE (PF) 100 MCG/2ML IJ SOLN
25.0000 ug | INTRAMUSCULAR | Status: DC | PRN
  Administered 2019-12-26 (×2): 50 ug via INTRAVENOUS

## 2019-12-26 MED ORDER — DEXAMETHASONE SODIUM PHOSPHATE 10 MG/ML IJ SOLN
INTRAMUSCULAR | Status: DC | PRN
  Administered 2019-12-26: 10 mg via INTRAVENOUS

## 2019-12-26 MED ORDER — PHENYLEPHRINE 40 MCG/ML (10ML) SYRINGE FOR IV PUSH (FOR BLOOD PRESSURE SUPPORT)
PREFILLED_SYRINGE | INTRAVENOUS | Status: DC | PRN
  Administered 2019-12-26 (×2): 120 ug via INTRAVENOUS
  Administered 2019-12-26: 80 ug via INTRAVENOUS
  Administered 2019-12-26: 120 ug via INTRAVENOUS

## 2019-12-26 SURGICAL SUPPLY — 36 items
ARMBAND PINK RESTRICT EXTREMIT (MISCELLANEOUS) ×2 IMPLANT
BNDG GAUZE ELAST 4 BULKY (GAUZE/BANDAGES/DRESSINGS) ×2 IMPLANT
CANISTER SUCT 3000ML PPV (MISCELLANEOUS) ×2 IMPLANT
CANNULA VESSEL 3MM 2 BLNT TIP (CANNULA) ×2 IMPLANT
CLIP LIGATING EXTRA MED SLVR (CLIP) ×2 IMPLANT
CLIP LIGATING EXTRA SM BLUE (MISCELLANEOUS) ×2 IMPLANT
COVER PROBE W GEL 5X96 (DRAPES) IMPLANT
COVER WAND RF STERILE (DRAPES) ×2 IMPLANT
CUFF TOURN SGL QUICK 18X4 (TOURNIQUET CUFF) IMPLANT
CUFF TOURN SGL QUICK 24 (TOURNIQUET CUFF) ×1
CUFF TRNQT CYL 24X4X16.5-23 (TOURNIQUET CUFF) ×1 IMPLANT
DECANTER SPIKE VIAL GLASS SM (MISCELLANEOUS) IMPLANT
DERMABOND ADVANCED (GAUZE/BANDAGES/DRESSINGS) ×1
DERMABOND ADVANCED .7 DNX12 (GAUZE/BANDAGES/DRESSINGS) ×1 IMPLANT
ELECT REM PT RETURN 9FT ADLT (ELECTROSURGICAL) ×2
ELECTRODE REM PT RTRN 9FT ADLT (ELECTROSURGICAL) ×1 IMPLANT
GAUZE SPONGE 4X4 12PLY STRL LF (GAUZE/BANDAGES/DRESSINGS) ×2 IMPLANT
GLOVE BIO SURGEON STRL SZ 6.5 (GLOVE) ×2 IMPLANT
GLOVE SS BIOGEL STRL SZ 7.5 (GLOVE) ×1 IMPLANT
GLOVE SUPERSENSE BIOGEL SZ 7.5 (GLOVE) ×1
GOWN STRL REUS W/ TWL LRG LVL3 (GOWN DISPOSABLE) ×3 IMPLANT
GOWN STRL REUS W/TWL LRG LVL3 (GOWN DISPOSABLE) ×3
KIT BASIN OR (CUSTOM PROCEDURE TRAY) ×2 IMPLANT
KIT TURNOVER KIT B (KITS) ×2 IMPLANT
NS IRRIG 1000ML POUR BTL (IV SOLUTION) ×2 IMPLANT
PACK CV ACCESS (CUSTOM PROCEDURE TRAY) ×2 IMPLANT
PAD ARMBOARD 7.5X6 YLW CONV (MISCELLANEOUS) ×4 IMPLANT
SLEEVE SURGEON STRL (DRAPES) ×2 IMPLANT
SUT ETHILON 3 0 PS 1 (SUTURE) ×4 IMPLANT
SUT PROLENE 5 0 C 1 24 (SUTURE) ×8 IMPLANT
SUT PROLENE 6 0 CC (SUTURE) ×4 IMPLANT
SUT VIC AB 3-0 SH 27 (SUTURE) ×1
SUT VIC AB 3-0 SH 27X BRD (SUTURE) ×1 IMPLANT
TOWEL GREEN STERILE (TOWEL DISPOSABLE) ×2 IMPLANT
UNDERPAD 30X36 HEAVY ABSORB (UNDERPADS AND DIAPERS) ×2 IMPLANT
WATER STERILE IRR 1000ML POUR (IV SOLUTION) ×2 IMPLANT

## 2019-12-26 NOTE — Op Note (Signed)
    OPERATIVE REPORT  DATE OF SURGERY: 12/26/2019  PATIENT: Jerry [name], 49 y.o. male MRN: [id number]  DOB: 04-Sep-1970  PRE-OPERATIVE DIAGNOSIS: Bleeding from right forearm AV fistula  POST-OPERATIVE DIAGNOSIS:  Same  PROCEDURE: Right AV fistula revision with repair of disrupted area in the vein fistula and primary closure over this  SURGEON:  Curt Jews, M.D.  PHYSICIAN ASSISTANT: Nurse  ANESTHESIA: General  EBL: per anesthesia record  Total I/O In: 400 [I.V.:400] Out: 10 [Blood:10]  BLOOD ADMINISTERED: none  DRAINS: none  SPECIMEN: none  COUNTS CORRECT:  YES  PATIENT DISPOSITION:  PACU - hemodynamically stable  PROCEDURE DETAILS: The patient was taken the operative placed in supine position.  The bleeding had been controlled with direct pressure.  The pneumatic tourniquet was positioned in the upper arm the arm was elevated and exsanguinated with an Esmarch tourniquet and the pneumatic tourniquet was inflated.  Patient had a very large defect approximately three quarters of a centimeter that was through and through the anterior surface of his fistula.  The an ellipse of skin was made and the vein was exposed proximal distal to these areas disruption.  The vein was normal proximal and distal.  The area of the disrupted vein was debrided back to healthy tissue.  The vein was occluded proximally distally and the pneumatic tourniquet was deflated.  The vein was closed with interrupted 5-0 Prolene sutures transversely.  This did not cause any narrowing of the fistula.  Clamps were removed and good anastomosis was encountered.  There was a good amount of flow through the AV fistula.  He had been using a buttonhole technique in the upper area and there did appear to be adequate vein proximal to the perineal repair for access as well.  Wounds irrigated with saline.  Hemostasis with cautery.  The closed with interrupted 3-0 nylon mattress sutures.  Sterile dressing was applied and  the patient was transferred to the recovery room in stable condition   Rosetta Posner, M.D., [facility] 12/26/2019 2:26 PM

## 2019-12-26 NOTE — Anesthesia Procedure Notes (Signed)
Procedure Name: Intubation Date/Time: 12/26/2019 12:59 PM Performed by: Bryson Corona, CRNA Pre-anesthesia Checklist: Patient identified, Emergency Drugs available, Suction available and Patient being monitored Patient Re-evaluated:Patient Re-evaluated prior to induction Oxygen Delivery Method: Circle System Utilized Preoxygenation: Pre-oxygenation with 100% oxygen Induction Type: IV induction, Rapid sequence and Cricoid Pressure applied Laryngoscope Size: Mac and 4 Grade View: Grade II Tube type: Oral Tube size: 7.5 mm Number of attempts: 1 Airway Equipment and Method: Stylet Placement Confirmation: ETT inserted through vocal cords under direct vision,  positive ETCO2 and breath sounds checked- equal and bilateral Secured at: 24 cm Tube secured with: Tape Dental Injury: Teeth and Oropharynx as per pre-operative assessment  Comments: Placed by D. [place], [place]

## 2019-12-26 NOTE — ED Provider Notes (Signed)
[facility] EMERGENCY DEPARTMENT Provider Note   CSN: [id number] Arrival date & time: 12/26/19  0855     History Chief Complaint  Patient presents with  . Vascular Access Problem    Jerry [name] is a 49 y.o. male.  HPI   Patient presents to the ED for evaluation of a bleeding fistula.  Patient states he has had some issue with infection and erosion of the skin in that area for at least several weeks to months.  Over time that hole has increased in size.  He says it is almost pea-sized now.  Patient states they have been accessing the fistula different location.  Patient was seen by the nephrologist and referred to vascular surgery but the appointments not for a couple of weeks.  He was getting dialysis when all of a sudden he started bleeding spontaneously from that site.  Patient had a pressure dressing applied with resolution of the bleeding.  Past Medical History:  Diagnosis Date  . Complication of anesthesia    "benadryl knocks me out; worse than any normal reactions; anesthesia/numbing RX wear off FAST" (01/30/2018)  . ESRD (end stage renal disease) on dialysis ([place])    "HAD DIALYSIS BEFORE TRANSPLANT; RESTARTED DIALYSIS 01/30/2018)  . Hypertension   . OSA on CPAP   . Renal disorder     Patient Active Problem List   Diagnosis Date Noted  . ARF (acute renal failure) ([place]) 01/29/2018  . Hypertensive urgency 01/29/2018  . Anemia 01/29/2018    Past Surgical History:  Procedure Laterality Date  . AV FISTULA PLACEMENT Right 2014  . KID[facility] TRANSPLANT  2014   at [facility]  . PARATHYROIDECTOMY  2014   at [facility]       Family History  Problem Relation Age of Onset  . Heart failure Mother   . Kidney failure Father     Social History   Tobacco Use  . Smoking status: Current Some Day Smoker    Years: 4.00    Types: Cigars  . Smokeless tobacco: Never Used  Vaping Use  . Vaping Use: Some days  Substance Use Topics  . Alcohol use: Yes    Alcohol/week:  2.0 standard drinks    Types: 2 Standard drinks or equivalent per week  . Drug use: Not Currently    Home Medications Prior to Admission medications   Medication Sig Start Date End Date Taking? Authorizing Provider  acetaminophen (TYLENOL) 500 MG tablet Take 1,000 mg by mouth every 6 (six) hours as needed for mild pain.   Yes [provider]  calcium acetate (PHOSLO) 667 MG capsule Take 2,001 mg by mouth with breakfast, with lunch, and with evening meal. And 1 capsule with a snack 12/01/19  Yes [provider]  losartan (COZAAR) 25 MG tablet Take 25 mg by mouth at bedtime. 08/06/19  Yes [provider]  multivitamin (RENA-VIT) TABS tablet Take 1 tablet by mouth daily.   Yes [provider]  amLODipine (NORVASC) 10 MG tablet Take 1 tablet (10 mg total) by mouth daily. Patient not taking: Reported on 12/26/2019 02/05/18   Donne Hazel, MD  carvedilol (COREG) 25 MG tablet Take 1 tablet (25 mg total) by mouth 2 (two) times daily with a meal. 02/04/18 03/06/18  Donne Hazel, MD  predniSONE (DELTASONE) 10 MG tablet Take 1 tablet (10 mg total) by mouth daily with breakfast. Patient not taking: Reported on 12/26/2019 02/04/18   Donne Hazel, MD    Allergies  Patient has no known allergies.  Review of Systems   Review of Systems  All other systems reviewed and are negative.   Physical Exam Updated Vital Signs BP (!) 136/100 (BP Location: Left Arm)   Resp 18   Wt 100.3 kg   BMI 33.62 kg/m   Physical Exam Vitals and nursing note reviewed.  Constitutional:      Appearance: He is well-developed. He is not ill-appearing or diaphoretic.  HENT:     Head: Normocephalic and atraumatic.     Right Ear: External ear normal.     Left Ear: External ear normal.  Eyes:     General: No scleral icterus.       Right eye: No discharge.        Left eye: No discharge.     Conjunctiva/sclera: Conjunctivae normal.  Neck:     Trachea: No tracheal deviation.   Cardiovascular:     Rate and Rhythm: Normal rate and regular rhythm.  Pulmonary:     Effort: Pulmonary effort is normal. No respiratory distress.     Breath sounds: Normal breath sounds. No stridor. No wheezing or rales.  Abdominal:     General: Bowel sounds are normal. There is no distension.     Palpations: Abdomen is soft.     Tenderness: There is no abdominal tenderness. There is no guarding or rebound.  Musculoskeletal:        General: No tenderness.     Cervical back: Neck supple.     Comments: Small ulcerative wound proximal to the area of bleeding, strong pulse thrill noted AV fistula, attempted to remove the dressing to visualize the wound with an inflated blood pressure cuff proximally and large amount of vascular bleeding resumed.  Unable to visualize the site.  Dressing applied directly over the site of bleeding and bleeding is now controlled with direct pressure  Skin:    General: Skin is warm and dry.     Findings: No rash.  Neurological:     Mental Status: He is alert.     Cranial Nerves: No cranial nerve deficit (no facial droop, extraocular movements intact, no slurred speech).     Sensory: No sensory deficit.     Motor: No abnormal muscle tone or seizure activity.     Coordination: Coordination normal.     ED Results / Procedures / Treatments   Labs (all labs ordered are listed, but only abnormal results are displayed) Labs Reviewed  CBC - Abnormal; Notable for the following components:      Result Value   RBC 3.77 (*)    Hemoglobin 10.9 (*)    HCT 36.7 (*)    MCHC 29.7 (*)    RDW 15.6 (*)    Platelets 405 (*)    All other components within normal limits  SARS CORONAVIRUS 2 BY RT PCR (HOSPITAL ORDER, [facility])  BASIC METABOLIC PANEL  SAMPLE TO BLOOD BANK    EKG None  Radiology No results found.  Procedures Procedures (including critical care time)  Medications Ordered in ED Medications  morphine 4 MG/ML injection 4  mg (4 mg Intravenous Given 12/26/19 0956)    ED Course  I have reviewed the triage vital signs and the nursing notes.  Pertinent labs & imaging results that were available during my care of the patient were reviewed by me and considered in my medical decision making (see chart for details).    MDM Rules/Calculators/A&P  Patient appears to have a bleeding AV fistula.  Sounds like he may have some type of skin erosion overlying the site.  Bleeding was controlled with constant direct pressure.  However as soon as pressure was removed the bleeding would resume.  Patient has remained hemodynamically stable in the ED.  Hemoglobin shows a stable anemia.  Vascular surgery was consulted and Dr. Donnetta Hutching evaluated the patient in the ED.  He reapplied a firm pressure dressing.  He will take the patient to the OR for operative repair. Final Clinical Impression(s) / ED Diagnoses Final diagnoses:  Bleeding from dialysis shunt, initial encounter [facility])      Dorie Rank, MD 12/26/19 [phone]

## 2019-12-26 NOTE — Anesthesia Preprocedure Evaluation (Addendum)
Anesthesia Evaluation  Patient identified by MRN, date of birth, ID band Patient awake    Reviewed: Allergy & Precautions, NPO status , Patient's Chart, lab work & pertinent test resultsPreop documentation limited or incomplete due to emergent nature of procedure.  History of Anesthesia Complications Negative for: history of anesthetic complications  Airway Mallampati: III  TM Distance: >3 FB Neck ROM: Full    Dental  (+) Dental Advisory Given, Teeth Intact   Pulmonary sleep apnea and Continuous Positive Airway Pressure Ventilation , Current Smoker and Patient abstained from smoking.,    Pulmonary exam normal        Cardiovascular hypertension, Pt. on medications  Rhythm:Regular Rate:Tachycardia   '19 TTE - Moderate concentric LVH. EF 55% to 60%. Grade 2 diastolic dysfunction. Ascending aortic diameter: 39 mm (S).  Mild MR. LA was severely dilated. Mild TR. A small pericardial effusion was identified.     Neuro/Psych negative neurological ROS  negative psych ROS   GI/Hepatic negative GI ROS, Neg liver ROS,   Endo/Other   Potassium 5.2   Renal/GU ESRF and DialysisRenal disease Previous renal tx, now back on dialysis      Musculoskeletal negative musculoskeletal ROS (+)   Abdominal   Peds  Hematology negative hematology ROS (+) anemia ,   Anesthesia Other Findings Covid test negative 6/11   Reproductive/Obstetrics                            Anesthesia Physical Anesthesia Plan  ASA: IV and emergent  Anesthesia Plan: General   Post-op Pain Management:    Induction: Intravenous, Rapid sequence and Cricoid pressure planned  PONV Risk Score and Plan: 2 and Treatment may vary due to age or medical condition, Ondansetron, Dexamethasone and Midazolam  Airway Management Planned: Oral ETT  Additional Equipment: None  Intra-op Plan:   Post-operative Plan: Extubation in OR  Informed  Consent: I have reviewed the patients History and Physical, chart, labs and discussed the procedure including the risks, benefits and alternatives for the proposed anesthesia with the patient or authorized representative who has indicated his/her understanding and acceptance.     Dental advisory given  Plan Discussed with: CRNA and Anesthesiologist  Anesthesia Plan Comments:        Anesthesia Quick Evaluation

## 2019-12-26 NOTE — Transfer of Care (Signed)
Immediate Anesthesia Transfer of Care Note  Patient: Jerry [name]  Procedure(s) Performed: Repair and Revision of Right Forearm AV Fistula (Right Arm Lower)  Patient Location: PACU  Anesthesia Type:General  Level of Consciousness: awake and alert   Airway & Oxygen Therapy: Patient Spontanous Breathing  Post-op Assessment: Report given to RN and Post -op Vital signs reviewed and stable  Post vital signs: Reviewed and stable  Last Vitals:  Vitals Value Taken Time  BP 157/91 12/26/19 1411  Temp    Pulse 90 12/26/19 1414  Resp 19 12/26/19 1414  SpO2 96 % 12/26/19 1414  Vitals shown include unvalidated device data.  Last Pain:  Vitals:   12/26/19 0945  TempSrc: Oral  PainSc: 10-Worst pain ever         Complications: No complications documented.

## 2019-12-26 NOTE — Consult Note (Signed)
Hospital Consult    Reason for Consult:  Bleeding fistula Requesting Physician:  ER MRN #:  [id number]  History of Present Illness: This is a 49 y.o. male pt with ESRD who dialyzes M/W/F at [facility] presented to the ER today with bleeding from his right RC AVF.  He states that it has bleed while on dialysis.   He states he dialyzed for about an hour before he was told to come to the ER.  He states that they have been sticking around this area.  This has been his only access.  He has had a TDC in the past.  He is not on blood thinners.  He has hx of OSA on CPAP and hypertension.  He states that he has had the fistula for about 10 years but did have a kidney transplant during that time.  He has been back on HD for about 2 years now.   The pt is not on a statin for cholesterol management.  The pt is not on a daily aspirin.   Other AC:  none The pt is on BB, ARB for hypertension.   The pt is not diabetic.   Tobacco hx:  current  Past Medical History:  Diagnosis Date   Complication of anesthesia    "benadryl knocks me out; worse than any normal reactions; anesthesia/numbing RX wear off FAST" (01/30/2018)   ESRD (end stage renal disease) on dialysis ([place])    "HAD DIALYSIS BEFORE TRANSPLANT; RESTARTED DIALYSIS 01/30/2018)   Hypertension    OSA on CPAP    Renal disorder     Past Surgical History:  Procedure Laterality Date   AV FISTULA PLACEMENT Right 2014   KIDNEY TRANSPLANT  2014   at [facility]  2014   at [facility]    No Known Allergies  Prior to Admission medications   Medication Sig Start Date End Date Taking? Authorizing Provider  acetaminophen (TYLENOL) 500 MG tablet Take 1,000 mg by mouth every 6 (six) hours as needed for mild pain.   Yes [provider]  calcium acetate (PHOSLO) 667 MG capsule Take 2,001 mg by mouth with breakfast, with lunch, and with evening meal. And 1 capsule with a snack 12/01/19  Yes [provider]  losartan  (COZAAR) 25 MG tablet Take 25 mg by mouth at bedtime. 08/06/19  Yes [provider]  multivitamin (RENA-VIT) TABS tablet Take 1 tablet by mouth daily.   Yes [provider]  amLODipine (NORVASC) 10 MG tablet Take 1 tablet (10 mg total) by mouth daily. Patient not taking: Reported on 12/26/2019 02/05/18   Donne Hazel, MD  carvedilol (COREG) 25 MG tablet Take 1 tablet (25 mg total) by mouth 2 (two) times daily with a meal. 02/04/18 03/06/18  Donne Hazel, MD  predniSONE (DELTASONE) 10 MG tablet Take 1 tablet (10 mg total) by mouth daily with breakfast. Patient not taking: Reported on 12/26/2019 02/04/18   Donne Hazel, MD    Social History   Socioeconomic History   Marital status: Divorced    Spouse name: Not on file   Number of children: Not on file   Years of education: Not on file   Highest education level: Not on file  Occupational History   Not on file  Tobacco Use   Smoking status: Current Some Day Smoker    Years: 4.00    Types: Cigars   Smokeless tobacco: Never Used  Vaping Use   Vaping Use: Some days  Substance and Sexual Activity   Alcohol use: Yes    Alcohol/week: 2.0 standard drinks    Types: 2 Standard drinks or equivalent per week   Drug use: Not Currently   Sexual activity: Yes  Other Topics Concern   Not on file  Social History Narrative   Not on file   Social Determinants of Health   Financial Resource Strain:    Difficulty of Paying Living Expenses:   Food Insecurity:    Worried About Charity fundraiser in the Last Year:    Arboriculturist in the Last Year:   Transportation Needs:    Film/video editor (Medical):    Lack of Transportation (Non-Medical):   Physical Activity:    Days of Exercise per Week:    Minutes of Exercise per Session:   Stress:    Feeling of Stress :   Social Connections:    Frequency of Communication with Friends and Family:    Frequency of Social Gatherings with Friends and  Family:    Attends Religious Services:    Active Member of Clubs or Organizations:    Attends Music therapist:    Marital Status:   Intimate Partner Violence:    Fear of Current or Ex-Partner:    Emotionally Abused:    Physically Abused:    Sexually Abused:      Family History  Problem Relation Age of Onset   Heart failure Mother    Kidney failure Father     ROS: [x]  Positive   [ ]  Negative   [ ]  All sytems reviewed and are negative See HPI   Physical Examination  Vitals:   12/26/19 0915 12/26/19 0930  BP: (!) 124/93 (!) 136/100  Resp:  18   Body mass index is 33.62 kg/m.  General:  WDWN in NAD Gait: Not observed HENT: WNL, normocephalic Pulmonary: normal non-labored breathing Cardiac: regular Abdomen:obese Skin: without rashes Vascular Exam/Pulses: + palpable right radial pulse Extremities:  Large ulcer on right RC AVF that is actively bleeding. Musculoskeletal: no muscle wasting or atrophy  Neurologic: A&O X 3;  No focal weakness or paresthesias are detected; speech is fluent/normal Psychiatric:  The pt has Normal affect.  CBC    Component Value Date/Time   WBC 7.7 12/26/2019 0923   RBC 3.77 (L) 12/26/2019 0923   HGB 10.9 (L) 12/26/2019 0923   HCT 36.7 (L) 12/26/2019 0923   PLT 405 (H) 12/26/2019 0923   MCV 97.3 12/26/2019 0923   MCH 28.9 12/26/2019 0923   MCHC 29.7 (L) 12/26/2019 0923   RDW 15.6 (H) 12/26/2019 0923   LYMPHSABS 1.8 12/11/2019 1928   MONOABS 0.8 12/11/2019 1928   EOSABS 0.7 (H) 12/11/2019 1928   BASOSABS 0.1 12/11/2019 1928    BMET    Component Value Date/Time   NA 137 12/11/2019 1928   K 4.6 12/11/2019 1928   CL 94 (L) 12/11/2019 1928   CO2 23 12/11/2019 1928   GLUCOSE 117 (H) 12/11/2019 1928   BUN 62 (H) 12/11/2019 1928   CREATININE 11.50 (H) 12/11/2019 1928   CALCIUM 7.0 (L) 12/11/2019 1928   GFRNONAA 5 (L) 12/11/2019 1928   GFRAA 5 (L) 12/11/2019 1928    COAGS: Lab Results  Component Value  Date   INR 1.0 12/11/2019     Non-Invasive Vascular Imaging:   none   ASSESSMENT/PLAN: This is a 49 y.o. male with ESRD with large ulceration on right RC AVF that is actively bleeding  -  compression placed on bleeding fistula -will take emergently to the operating room for  revision of fistula, possible ligation of fistula and possible insertion of tunneled dialysis catheter.  Dr. Donnetta Hutching discussed in detail with pt that it is high likelihood of catheter placement.  -pt received about an hour of dialysis this am and is scheduled to return tomorrow morning for HD.   Leontine Locket, PA-C [facility] [phone]

## 2019-12-26 NOTE — ED Triage Notes (Signed)
Pt in with RFA bleeding fistula, x 30 min. Pt has been dealing w/infection to site for some time, and access hole has gotten wider. He got 1 hr of dialysis trx today, and he states blood started gushing out of site. Arrives w/coban, kerlix and finger pressure applied

## 2019-12-26 NOTE — Anesthesia Postprocedure Evaluation (Signed)
Anesthesia Post Note  Patient: Jerry [name]  Procedure(s) Performed: Repair and Revision of Right Forearm AV Fistula (Right Arm Lower)     Patient location during evaluation: PACU Anesthesia Type: General Level of consciousness: awake and alert Pain management: pain level controlled Vital Signs Assessment: post-procedure vital signs reviewed and stable Respiratory status: spontaneous breathing, nonlabored ventilation and respiratory function stable Cardiovascular status: blood pressure returned to baseline and stable Postop Assessment: no apparent nausea or vomiting Anesthetic complications: no   No complications documented.  Last Vitals:  Vitals:   12/26/19 1440 12/26/19 1455  BP: 128/81 127/87  Pulse: 76 79  Resp: 14 14  Temp:    SpO2: 95% 94%    Last Pain:  Vitals:   12/26/19 1440  TempSrc:   PainSc: [place]

## 2019-12-27 ENCOUNTER — Encounter (HOSPITAL_COMMUNITY): Payer: Self-pay | Admitting: Vascular Surgery

## 2020-01-20 ENCOUNTER — Encounter: Payer: Medicare (Managed Care) | Admitting: Vascular Surgery

## 2020-02-02 ENCOUNTER — Encounter (HOSPITAL_COMMUNITY): Payer: Self-pay | Admitting: Internal Medicine

## 2020-02-02 ENCOUNTER — Inpatient Hospital Stay (HOSPITAL_COMMUNITY): Payer: Medicare (Managed Care) | Admitting: Certified Registered Nurse Anesthetist

## 2020-02-02 ENCOUNTER — Inpatient Hospital Stay (HOSPITAL_COMMUNITY): Payer: Medicare (Managed Care)

## 2020-02-02 ENCOUNTER — Encounter (HOSPITAL_COMMUNITY): Admission: EM | Disposition: A | Discharge status: Home / Self Care | Payer: Self-pay | Attending: Internal Medicine

## 2020-02-02 ENCOUNTER — Inpatient Hospital Stay (HOSPITAL_COMMUNITY)
Admission: EM | Admit: 2020-02-02 | Discharge: 2020-02-04 | DRG: 314 | Disposition: A | Discharge status: Home / Self Care | Payer: Medicare (Managed Care) | Source: Other Acute Inpatient Hospital | Attending: Internal Medicine | Admitting: Internal Medicine

## 2020-02-02 DIAGNOSIS — F17290 Nicotine dependence, other tobacco product, uncomplicated: Secondary | ICD-10-CM | POA: Diagnosis present

## 2020-02-02 DIAGNOSIS — E8889 Other specified metabolic disorders: Secondary | ICD-10-CM | POA: Diagnosis present

## 2020-02-02 DIAGNOSIS — E875 Hyperkalemia: Secondary | ICD-10-CM | POA: Diagnosis present

## 2020-02-02 DIAGNOSIS — Z992 Dependence on renal dialysis: Secondary | ICD-10-CM | POA: Diagnosis not present

## 2020-02-02 DIAGNOSIS — L0390 Cellulitis, unspecified: Secondary | ICD-10-CM

## 2020-02-02 DIAGNOSIS — Z8249 Family history of ischemic heart disease and other diseases of the circulatory system: Secondary | ICD-10-CM

## 2020-02-02 DIAGNOSIS — N185 Chronic kidney disease, stage 5: Secondary | ICD-10-CM | POA: Diagnosis not present

## 2020-02-02 DIAGNOSIS — R7989 Other specified abnormal findings of blood chemistry: Secondary | ICD-10-CM | POA: Diagnosis present

## 2020-02-02 DIAGNOSIS — Z95828 Presence of other vascular implants and grafts: Secondary | ICD-10-CM

## 2020-02-02 DIAGNOSIS — I120 Hypertensive chronic kidney disease with stage 5 chronic kidney disease or end stage renal disease: Secondary | ICD-10-CM | POA: Diagnosis present

## 2020-02-02 DIAGNOSIS — N186 End stage renal disease: Secondary | ICD-10-CM | POA: Diagnosis present

## 2020-02-02 DIAGNOSIS — L03113 Cellulitis of right upper limb: Secondary | ICD-10-CM | POA: Diagnosis present

## 2020-02-02 DIAGNOSIS — T8612 Kidney transplant failure: Secondary | ICD-10-CM | POA: Diagnosis present

## 2020-02-02 DIAGNOSIS — T827XXA Infection and inflammatory reaction due to other cardiac and vascular devices, implants and grafts, initial encounter: Secondary | ICD-10-CM | POA: Diagnosis present

## 2020-02-02 DIAGNOSIS — Z419 Encounter for procedure for purposes other than remedying health state, unspecified: Secondary | ICD-10-CM

## 2020-02-02 DIAGNOSIS — N2581 Secondary hyperparathyroidism of renal origin: Secondary | ICD-10-CM | POA: Diagnosis present

## 2020-02-02 DIAGNOSIS — Z20822 Contact with and (suspected) exposure to covid-19: Secondary | ICD-10-CM | POA: Diagnosis present

## 2020-02-02 DIAGNOSIS — Z79899 Other long term (current) drug therapy: Secondary | ICD-10-CM

## 2020-02-02 DIAGNOSIS — G4733 Obstructive sleep apnea (adult) (pediatric): Secondary | ICD-10-CM | POA: Diagnosis present

## 2020-02-02 DIAGNOSIS — Y830 Surgical operation with transplant of whole organ as the cause of abnormal reaction of the patient, or of later complication, without mention of misadventure at the time of the procedure: Secondary | ICD-10-CM | POA: Diagnosis present

## 2020-02-02 DIAGNOSIS — E669 Obesity, unspecified: Secondary | ICD-10-CM | POA: Diagnosis present

## 2020-02-02 DIAGNOSIS — I10 Essential (primary) hypertension: Secondary | ICD-10-CM | POA: Diagnosis not present

## 2020-02-02 DIAGNOSIS — Z6838 Body mass index (BMI) 38.0-38.9, adult: Secondary | ICD-10-CM

## 2020-02-02 DIAGNOSIS — D631 Anemia in chronic kidney disease: Secondary | ICD-10-CM | POA: Diagnosis present

## 2020-02-02 DIAGNOSIS — Y832 Surgical operation with anastomosis, bypass or graft as the cause of abnormal reaction of the patient, or of later complication, without mention of misadventure at the time of the procedure: Secondary | ICD-10-CM | POA: Diagnosis present

## 2020-02-02 DIAGNOSIS — Z841 Family history of disorders of kidney and ureter: Secondary | ICD-10-CM

## 2020-02-02 HISTORY — PX: INSERTION OF DIALYSIS CATHETER: SHX1324

## 2020-02-02 LAB — COMPREHENSIVE METABOLIC PANEL
ALT: 12 U/L (ref 0–44)
AST: 35 U/L (ref 15–41)
Albumin: 3.2 g/dL — ABNORMAL LOW (ref 3.5–5.0)
Alkaline Phosphatase: 70 U/L (ref 38–126)
Anion gap: 18 — ABNORMAL HIGH (ref 5–15)
BUN: 67 mg/dL — ABNORMAL HIGH (ref 6–20)
CO2: 23 mmol/L (ref 22–32)
Calcium: 7.7 mg/dL — ABNORMAL LOW (ref 8.9–10.3)
Chloride: 98 mmol/L (ref 98–111)
Creatinine, Ser: 13.88 mg/dL — ABNORMAL HIGH (ref 0.61–1.24)
GFR calc Af Amer: 4 mL/min — ABNORMAL LOW (ref >60)
GFR calc non Af Amer: 4 mL/min — ABNORMAL LOW (ref >60)
Glucose, Bld: 91 mg/dL (ref 70–99)
Potassium: 5.8 mmol/L — ABNORMAL HIGH (ref 3.5–5.1)
Sodium: 139 mmol/L (ref 135–145)
Total Bilirubin: 1 mg/dL (ref 0.3–1.2)
Total Protein: 8 g/dL (ref 6.5–8.1)

## 2020-02-02 LAB — CBC WITH DIFFERENTIAL/PLATELET
Abs Immature Granulocytes: 0.13 10*3/uL — ABNORMAL HIGH (ref 0.00–0.07)
Basophils Absolute: 0.1 10*3/uL (ref 0.0–0.1)
Basophils Relative: 1 %
Eosinophils Absolute: 0.8 10*3/uL — ABNORMAL HIGH (ref 0.0–0.5)
Eosinophils Relative: 7 %
HCT: 33.7 % — ABNORMAL LOW (ref 39.0–52.0)
Hemoglobin: 9.6 g/dL — ABNORMAL LOW (ref 13.0–17.0)
Immature Granulocytes: 1 %
Lymphocytes Relative: 15 %
Lymphs Abs: 1.8 10*3/uL (ref 0.7–4.0)
MCH: 27.4 pg (ref 26.0–34.0)
MCHC: 28.5 g/dL — ABNORMAL LOW (ref 30.0–36.0)
MCV: 96.3 fL (ref 80.0–100.0)
Monocytes Absolute: 0.7 10*3/uL (ref 0.1–1.0)
Monocytes Relative: 6 %
Neutro Abs: 8.1 10*3/uL — ABNORMAL HIGH (ref 1.7–7.7)
Neutrophils Relative %: 70 %
Platelets: 534 10*3/uL — ABNORMAL HIGH (ref 150–400)
RBC: 3.5 MIL/uL — ABNORMAL LOW (ref 4.22–5.81)
RDW: 16.8 % — ABNORMAL HIGH (ref 11.5–15.5)
WBC: 11.7 10*3/uL — ABNORMAL HIGH (ref 4.0–10.5)
nRBC: 0 % (ref 0.0–0.2)

## 2020-02-02 LAB — HEPATITIS B SURFACE ANTIGEN: Hepatitis B Surface Ag: NONREACTIVE

## 2020-02-02 LAB — SARS CORONAVIRUS 2 BY RT PCR (HOSPITAL ORDER, PERFORMED IN ~~LOC~~ HOSPITAL LAB): SARS Coronavirus 2: NEGATIVE

## 2020-02-02 SURGERY — INSERTION OF DIALYSIS CATHETER
Anesthesia: Monitor Anesthesia Care | Laterality: Left

## 2020-02-02 MED ORDER — KETOROLAC TROMETHAMINE 30 MG/ML IJ SOLN
INTRAMUSCULAR | Status: AC
  Filled 2020-02-02: qty 1

## 2020-02-02 MED ORDER — ACETAMINOPHEN 325 MG PO TABS
650.0000 mg | ORAL_TABLET | Freq: Four times a day (QID) | ORAL | Status: DC | PRN
  Administered 2020-02-03 (×3): 650 mg via ORAL
  Filled 2020-02-02 (×3): qty 2

## 2020-02-02 MED ORDER — 0.9 % SODIUM CHLORIDE (POUR BTL) OPTIME
TOPICAL | Status: DC | PRN
  Administered 2020-02-02: 1000 mL

## 2020-02-02 MED ORDER — DOXERCALCIFEROL 4 MCG/2ML IV SOLN
1.0000 ug | INTRAVENOUS | Status: DC
  Filled 2020-02-02: qty 2

## 2020-02-02 MED ORDER — CHLORHEXIDINE GLUCONATE CLOTH 2 % EX PADS
6.0000 | MEDICATED_PAD | Freq: Every day | CUTANEOUS | Status: DC
  Administered 2020-02-04: 6 via TOPICAL

## 2020-02-02 MED ORDER — OXYCODONE HCL 5 MG PO TABS: 5.0000 mg | ORAL_TABLET | Freq: Once | ORAL | Status: DC | PRN

## 2020-02-02 MED ORDER — FENTANYL CITRATE (PF) 100 MCG/2ML IJ SOLN: 25.0000 ug | INTRAMUSCULAR | Status: DC | PRN

## 2020-02-02 MED ORDER — PROMETHAZINE HCL 25 MG/ML IJ SOLN: 6.2500 mg | INTRAMUSCULAR | Status: DC | PRN

## 2020-02-02 MED ORDER — DEXAMETHASONE SODIUM PHOSPHATE 10 MG/ML IJ SOLN
INTRAMUSCULAR | Status: DC | PRN
  Administered 2020-02-02: 5 mg via INTRAVENOUS

## 2020-02-02 MED ORDER — LABETALOL HCL 5 MG/ML IV SOLN
INTRAVENOUS | Status: DC | PRN
Start: 2020-02-02 — End: 2020-02-02
  Administered 2020-02-02: 5 mg via INTRAVENOUS

## 2020-02-02 MED ORDER — SODIUM CHLORIDE 0.9 % IV SOLN
INTRAVENOUS | Status: DC | PRN
  Administered 2020-02-02: 500 mL

## 2020-02-02 MED ORDER — ONDANSETRON HCL 4 MG/2ML IJ SOLN
INTRAMUSCULAR | Status: AC
  Filled 2020-02-02: qty 2

## 2020-02-02 MED ORDER — HYDRALAZINE HCL 25 MG PO TABS
25.0000 mg | ORAL_TABLET | Freq: Four times a day (QID) | ORAL | Status: DC | PRN
  Administered 2020-02-03: 25 mg via ORAL
  Filled 2020-02-02: qty 1

## 2020-02-02 MED ORDER — LIDOCAINE HCL 1 % IJ SOLN
INTRAMUSCULAR | Status: DC | PRN
  Administered 2020-02-02: 20 mL

## 2020-02-02 MED ORDER — LIDOCAINE 2% (20 MG/ML) 5 ML SYRINGE
INTRAMUSCULAR | Status: DC | PRN
  Administered 2020-02-02: 80 mg via INTRAVENOUS

## 2020-02-02 MED ORDER — FENTANYL CITRATE (PF) 250 MCG/5ML IJ SOLN
INTRAMUSCULAR | Status: AC
  Filled 2020-02-02: qty 5

## 2020-02-02 MED ORDER — SODIUM CHLORIDE 0.9 % IV SOLN
INTRAVENOUS | Status: AC
  Filled 2020-02-02: qty 1.2

## 2020-02-02 MED ORDER — DEXAMETHASONE SODIUM PHOSPHATE 10 MG/ML IJ SOLN
INTRAMUSCULAR | Status: AC
  Filled 2020-02-02: qty 1

## 2020-02-02 MED ORDER — LOSARTAN POTASSIUM 25 MG PO TABS
25.0000 mg | ORAL_TABLET | Freq: Once | ORAL | Status: AC
  Administered 2020-02-03: 25 mg via ORAL
  Filled 2020-02-02 (×2): qty 1

## 2020-02-02 MED ORDER — HEPARIN SODIUM (PORCINE) 1000 UNIT/ML IJ SOLN
INTRAMUSCULAR | Status: DC | PRN
  Administered 2020-02-02: 4200 [IU]

## 2020-02-02 MED ORDER — FENTANYL CITRATE (PF) 250 MCG/5ML IJ SOLN
INTRAMUSCULAR | Status: DC | PRN
  Administered 2020-02-02: 50 ug via INTRAVENOUS
  Administered 2020-02-02 (×2): 25 ug via INTRAVENOUS

## 2020-02-02 MED ORDER — PROPOFOL 10 MG/ML IV BOLUS
INTRAVENOUS | Status: AC
  Filled 2020-02-02: qty 20

## 2020-02-02 MED ORDER — MIDAZOLAM HCL 2 MG/2ML IJ SOLN
INTRAMUSCULAR | Status: DC | PRN
  Administered 2020-02-02 (×2): 1 mg via INTRAVENOUS

## 2020-02-02 MED ORDER — ACETAMINOPHEN 650 MG RE SUPP: 650.0000 mg | Freq: Four times a day (QID) | RECTAL | Status: DC | PRN

## 2020-02-02 MED ORDER — HEPARIN SODIUM (PORCINE) 1000 UNIT/ML IJ SOLN
INTRAMUSCULAR | Status: AC
  Filled 2020-02-02: qty 1

## 2020-02-02 MED ORDER — ONDANSETRON HCL 4 MG/2ML IJ SOLN: 4.0000 mg | Freq: Once | INTRAMUSCULAR | Status: DC | PRN

## 2020-02-02 MED ORDER — FENTANYL CITRATE (PF) 100 MCG/2ML IJ SOLN
INTRAMUSCULAR | Status: AC
  Administered 2020-02-02: 50 ug via INTRAVENOUS
  Filled 2020-02-02: qty 2

## 2020-02-02 MED ORDER — DARBEPOETIN ALFA 40 MCG/0.4ML IJ SOSY
40.0000 ug | PREFILLED_SYRINGE | INTRAMUSCULAR | Status: DC
  Filled 2020-02-02: qty 0.4

## 2020-02-02 MED ORDER — PROPOFOL 500 MG/50ML IV EMUL
INTRAVENOUS | Status: DC | PRN
  Administered 2020-02-02: 50 ug/kg/min via INTRAVENOUS

## 2020-02-02 MED ORDER — ONDANSETRON HCL 4 MG/2ML IJ SOLN
INTRAMUSCULAR | Status: DC | PRN
  Administered 2020-02-02: 4 mg via INTRAVENOUS

## 2020-02-02 MED ORDER — LABETALOL HCL 5 MG/ML IV SOLN
INTRAVENOUS | Status: AC
  Filled 2020-02-02: qty 4

## 2020-02-02 MED ORDER — FENTANYL CITRATE (PF) 100 MCG/2ML IJ SOLN
25.0000 ug | Freq: Once | INTRAMUSCULAR | Status: AC
  Administered 2020-02-02: 25 ug via INTRAVENOUS
  Filled 2020-02-02: qty 2

## 2020-02-02 MED ORDER — SEVELAMER CARBONATE 800 MG PO TABS
800.0000 mg | ORAL_TABLET | Freq: Three times a day (TID) | ORAL | Status: DC
  Administered 2020-02-03 – 2020-02-04 (×5): 800 mg via ORAL
  Filled 2020-02-02 (×5): qty 1

## 2020-02-02 MED ORDER — SODIUM CHLORIDE 0.9 % IV SOLN
100.0000 mg | Freq: Two times a day (BID) | INTRAVENOUS | Status: DC
  Administered 2020-02-02 – 2020-02-04 (×4): 100 mg via INTRAVENOUS
  Filled 2020-02-02 (×5): qty 100

## 2020-02-02 MED ORDER — OXYCODONE HCL 5 MG/5ML PO SOLN: 5.0000 mg | Freq: Once | ORAL | Status: DC | PRN

## 2020-02-02 MED ORDER — SODIUM CHLORIDE 0.9 % IV SOLN: INTRAVENOUS | Status: DC

## 2020-02-02 MED ORDER — SODIUM ZIRCONIUM CYCLOSILICATE 10 G PO PACK
10.0000 g | PACK | Freq: Once | ORAL | Status: AC
  Administered 2020-02-02: 10 g via ORAL
  Filled 2020-02-02: qty 1

## 2020-02-02 MED ORDER — MIDAZOLAM HCL 2 MG/2ML IJ SOLN
INTRAMUSCULAR | Status: AC
  Filled 2020-02-02: qty 2

## 2020-02-02 MED ORDER — LIDOCAINE HCL 1 % IJ SOLN
INTRAMUSCULAR | Status: AC
  Filled 2020-02-02: qty 20

## 2020-02-02 MED ORDER — PROPOFOL 10 MG/ML IV BOLUS
INTRAVENOUS | Status: DC | PRN
  Administered 2020-02-02: 20 mg via INTRAVENOUS

## 2020-02-02 SURGICAL SUPPLY — 48 items
BAG DECANTER FOR FLEXI CONT (MISCELLANEOUS) ×2 IMPLANT
BIOPATCH RED 1 DISK 7.0 (GAUZE/BANDAGES/DRESSINGS) ×2 IMPLANT
BLADE SURG SZ11 CARB STEEL (BLADE) ×2 IMPLANT
CATH PALINDROME-P 19CM W/VT (CATHETERS) IMPLANT
CATH PALINDROME-P 23CM W/VT (CATHETERS) ×2 IMPLANT
CATH PALINDROME-P 28CM W/VT (CATHETERS) ×2 IMPLANT
CATH STRAIGHT 5FR 65CM (CATHETERS) IMPLANT
COVER PROBE W GEL 5X96 (DRAPES) ×2 IMPLANT
COVER SURGICAL LIGHT HANDLE (MISCELLANEOUS) ×2 IMPLANT
DECANTER SPIKE VIAL GLASS SM (MISCELLANEOUS) ×2 IMPLANT
DERMABOND ADVANCED (GAUZE/BANDAGES/DRESSINGS) ×1
DERMABOND ADVANCED .7 DNX12 (GAUZE/BANDAGES/DRESSINGS) ×1 IMPLANT
DRAPE C-ARM 42X72 X-RAY (DRAPES) ×2 IMPLANT
DRAPE CHEST BREAST 15X10 FENES (DRAPES) ×2 IMPLANT
GAUZE 4X4 16PLY RFD (DISPOSABLE) ×2 IMPLANT
GLOVE BIO SURGEON STRL SZ 6.5 (GLOVE) ×2 IMPLANT
GLOVE BIO SURGEON STRL SZ7 (GLOVE) ×2 IMPLANT
GLOVE BIO SURGEON STRL SZ7.5 (GLOVE) ×2 IMPLANT
GLOVE BIOGEL PI IND STRL 6.5 (GLOVE) ×1 IMPLANT
GLOVE BIOGEL PI IND STRL 8 (GLOVE) ×1 IMPLANT
GLOVE BIOGEL PI INDICATOR 6.5 (GLOVE) ×1
GLOVE BIOGEL PI INDICATOR 8 (GLOVE) ×1
GOWN STRL REUS W/ TWL LRG LVL3 (GOWN DISPOSABLE) ×1 IMPLANT
GOWN STRL REUS W/ TWL XL LVL3 (GOWN DISPOSABLE) ×1 IMPLANT
GOWN STRL REUS W/TWL LRG LVL3 (GOWN DISPOSABLE) ×1
GOWN STRL REUS W/TWL XL LVL3 (GOWN DISPOSABLE) ×1
GUIDEWIRE ANGLED .035X150CM (WIRE) ×2 IMPLANT
KIT BASIN OR (CUSTOM PROCEDURE TRAY) ×2 IMPLANT
KIT PALINDROME-P 55CM (CATHETERS) IMPLANT
KIT TURNOVER KIT B (KITS) ×2 IMPLANT
NEEDLE 18GX1X1/2 (RX/OR ONLY) (NEEDLE) ×2 IMPLANT
NEEDLE HYPO 25GX1X1/2 BEV (NEEDLE) ×2 IMPLANT
NS IRRIG 1000ML POUR BTL (IV SOLUTION) ×2 IMPLANT
PACK SURGICAL SETUP 50X90 (CUSTOM PROCEDURE TRAY) ×2 IMPLANT
PAD ARMBOARD 7.5X6 YLW CONV (MISCELLANEOUS) ×4 IMPLANT
SET MICROPUNCTURE 5F STIFF (MISCELLANEOUS) IMPLANT
SOAP 2 % CHG 4 OZ (WOUND CARE) ×2 IMPLANT
SUT ETHILON 3 0 PS 1 (SUTURE) ×2 IMPLANT
SUT MNCRL AB 4-0 PS2 18 (SUTURE) ×2 IMPLANT
SYR 10ML LL (SYRINGE) ×2 IMPLANT
SYR 20ML LL LF (SYRINGE) ×4 IMPLANT
SYR 5ML LL (SYRINGE) ×2 IMPLANT
SYR CONTROL 10ML LL (SYRINGE) ×2 IMPLANT
TOWEL GREEN STERILE (TOWEL DISPOSABLE) ×2 IMPLANT
TOWEL GREEN STERILE FF (TOWEL DISPOSABLE) ×2 IMPLANT
WATER STERILE IRR 1000ML POUR (IV SOLUTION) ×2 IMPLANT
WIRE AMPLATZ SS-J .035X180CM (WIRE) IMPLANT
WIRE STARTER BENTSON 035X150 (WIRE) ×2 IMPLANT

## 2020-02-02 NOTE — Op Note (Signed)
OPERATIVE NOTE  PROCEDURE: 1. Right internal jugular vein cannulation under ultrasound guidance 2. Left internal jugular vein cannulation under ultrasound guidance 3. Left internal jugular vein tunneled dialysis catheter placement (28 cm Palindrome)  PRE-OPERATIVE DIAGNOSIS: end-stage renal failure  POST-OPERATIVE DIAGNOSIS: same as above  SURGEON: Marty Heck, MD  ANESTHESIA: MAC  ESTIMATED BLOOD LOSS: 30 cc  FINDING(S): Right IJ was initially accessed with ultrasound guidance but the wire would not thread suggesting central venous occlusion.  Subsequently accessed the left IJ successfully with ultrasound and advanced a 28 cm palindrome catheter with the tip into the right atrium.  The catheter aspirated and flushed adequately once I pulled it back slightly.  SPECIMEN(S):  none  INDICATIONS:   Jerry [name] is a 49 y.o. male who presents with end stage renal disease.  There has been concern for infection at his recent right AV fistula revision site and we have recommended tunneled dialysis catheter to rest his fistula.  The patient presents for tunneled dialysis catheter placement.  The patient is aware the risks of tunneled dialysis catheter placement include but are not limited to: bleeding, infection, central venous injury, pneumothorax, possible venous stenosis, possible malpositioning in the venous system, and possible infections related to long-term catheter presence.  The patient was aware of these risks and agreed to proceed.  DESCRIPTION: After written full informed consent was obtained from the patient, the patient was taken back to the operating room.  Prior to induction, the patient was given IV antibiotics.  After obtaining adequate sedation, the patient was prepped and draped in the standard fashion for a chest or neck tunneled dialysis catheter placement.   The cannulation site, the catheter exit site, and tract for the subcutaneous tunnel were then  anesthestized with a total of 15 cc of 1% lidocaine without epinephrine.  Under ultrasound guidance, the right internal jugular vein was cannulated with the 18 gauge needle.  Attempted to place a Bentson wire that would not thread and then a Glidewire that also would not thread even with good venous backbleeding.  Then elected to go to the left side given suggestion of central venous occlusion on the right.  The left internal jugular vein was then evaluated, it was patent and image was saved.  Accessed this with an 18-gauge needle and placed a J-wire into the right ventricle under fluoroscopic guidance.  The wire was then secured in place with a clamp to the drapes.  I then made stab incisions at the neck and exit sites.   I dissected from the exit site to the cannulation site with a tunneler and the 28 cm catheter was tunneled under the chest wall.   I then placed dilators over the wire and placed a large dilator peel away sheath.  The tip of the catheter was placed in the right atrium.  The sheath was broken and peeled away while holding the catheter cuff at the level of the skin.  Each port was tested by aspirating and flushing.  No resistance was noted after I pulled the catheter back slightly..  Each port was then thoroughly flushed with heparinized saline.  The catheter was secured in placed with two interrupted stitches of 3-0 Nylon tied to the catheter.  The neck incision was closed with a U-stitch of 4-0 Monocryl.  The neck and chest incision were cleaned and sterile bandages applied.  Each port was then loaded with concentrated heparin (1000 Units/mL) at the manufacturer recommended volumes to each port.  Sterile caps were applied to each port.    On completion fluoroscopy, the tips of the catheter were in the right atrium, and there was no evidence of pneumothorax.   COMPLICATIONS: None  CONDITION: Stable  Marty Heck, MD [facility]:  [phone]   02/02/2020, 5:12 PM

## 2020-02-02 NOTE — Progress Notes (Signed)
Patient unavailable at this time for CPAP. RT will check back later.

## 2020-02-02 NOTE — ED Provider Notes (Signed)
[facility] EMERGENCY DEPARTMENT Provider Note   CSN: [id number] Arrival date & time: 02/02/20  0753     History Chief Complaint  Patient presents with  . Wound Infection    Jerry [name] is a 49 y.o. male with PMH significant for ESRD on HD and recent right arm AV fistula repair 12/26/2019 who presents to the ED with complaints of wound infection.  Patient was reportedly sent here from dialysis.  Juanell Fairly NP, [employer], had spoken with [place] PA and states that patient needs both blood and wound cultures.  Patient will ultimately need to be admitted to medicine, but vein and vascular will need to be consulted for tunnel cath.    On my examination, patient states that he has been experiencing intermittent fevers and chills subjectively at home.  None documented.  He also felt particularly weak yesterday.  He states that his potassium is up because he had a cheat day in which he ate lots of potatoes.  Patient is complaining of pain involving his right forearm.  No other complaints.  HPI     Past Medical History:  Diagnosis Date  . Complication of anesthesia    "benadryl knocks me out; worse than any normal reactions; anesthesia/numbing RX wear off FAST" (01/30/2018)  . ESRD (end stage renal disease) on dialysis ([place])    "HAD DIALYSIS BEFORE TRANSPLANT; RESTARTED DIALYSIS 01/30/2018)  . Hypertension   . OSA on CPAP   . Renal disorder     Patient Active Problem List   Diagnosis Date Noted  . ARF (acute renal failure) ([place]) 01/29/2018  . Hypertensive urgency 01/29/2018  . Anemia 01/29/2018    Past Surgical History:  Procedure Laterality Date  . AV FISTULA PLACEMENT Right 2014  . AV FISTULA PLACEMENT Right 12/26/2019   Procedure: Repair and Revision of Right Forearm AV Fistula;  Surgeon: Rosetta Posner, MD;  Location: [facility];  Service: Vascular;  Laterality: Right;  . KIDNEY TRANSPLANT  2014   at [facility]  . PARATHYROIDECTOMY  2014   at [facility]        Family History  Problem Relation Age of Onset  . Heart failure Mother   . Kidney failure Father     Social History   Tobacco Use  . Smoking status: Current Some Day Smoker    Years: 4.00    Types: Cigars  . Smokeless tobacco: Never Used  Vaping Use  . Vaping Use: Some days  Substance Use Topics  . Alcohol use: Yes    Alcohol/week: 2.0 standard drinks    Types: 2 Standard drinks or equivalent per week  . Drug use: Not Currently    Home Medications Prior to Admission medications   Medication Sig Start Date End Date Taking? Authorizing Provider  acetaminophen (TYLENOL) 500 MG tablet Take 1,000 mg by mouth every 6 (six) hours as needed for mild pain.    [provider]  calcium acetate (PHOSLO) 667 MG capsule Take 2,001 mg by mouth with breakfast, with lunch, and with evening meal. And 1 capsule with a snack 12/01/19   [provider]  carvedilol (COREG) 25 MG tablet Take 1 tablet (25 mg total) by mouth 2 (two) times daily with a meal. 02/04/18 03/06/18  Donne Hazel, MD  losartan (COZAAR) 25 MG tablet Take 25 mg by mouth at bedtime. 08/06/19   [provider]  multivitamin (RENA-VIT) TABS tablet Take 1 tablet by mouth daily.    [provider]  oxyCODONE-acetaminophen (PERCOCET/ROXICET) 5-325 MG tablet Take 1 tablet by mouth every 6 (six) hours as needed. Patient taking differently: Take 1 tablet by mouth every 6 (six) hours as needed for moderate pain or severe pain.  12/26/19   Ulyses Amor, PA-C    Allergies    Patient has no known allergies.  Review of Systems   Review of Systems  All other systems reviewed and are negative.   Physical Exam Updated Vital Signs BP (!) 185/125   Pulse 91   Temp 98.3 F (36.8 C) (Oral)   Resp 15   Ht 5\' 8"  (1.727 m)   Wt 111 kg   SpO2 100%   BMI 37.21 kg/m   Physical Exam Vitals and nursing note reviewed. Exam conducted with a chaperone present.  Constitutional:      General: He  is not in acute distress. HENT:     Head: Normocephalic and atraumatic.  Eyes:     General: No scleral icterus.    Conjunctiva/sclera: Conjunctivae normal.  Cardiovascular:     Rate and Rhythm: Normal rate and regular rhythm.     Pulses: Normal pulses.     Heart sounds: Normal heart sounds.  Pulmonary:     Effort: Pulmonary effort is normal. No respiratory distress.     Breath sounds: Normal breath sounds.  Musculoskeletal:     Comments: Right forearm: Grayish scaly, indurated appearance involving a large portion of dorsal aspect right forearm.  3 cm incision site from surgery performed 12/26/2019 has dehisced.  Sutures appear to have been left in (4?).  There is also a area of drainage 3 to 4 cm proximal incision site.  Mild purulence noted.  Mildly tender to palpation.  Distal peripheral appreciated.  Radial pulse intact.  ROM intact.  Skin:    General: Skin is dry.  Neurological:     Mental Status: He is alert.     GCS: GCS eye subscore is 4. GCS verbal subscore is 5. GCS motor subscore is 6.  Psychiatric:        Mood and Affect: Mood normal.        Behavior: Behavior normal.        Thought Content: Thought content normal.         ED Results / Procedures / Treatments   Labs (all labs ordered are listed, but only abnormal results are displayed) Labs Reviewed  CBC WITH DIFFERENTIAL/PLATELET - Abnormal; Notable for the following components:      Result Value   WBC 11.7 (*)    RBC 3.50 (*)    Hemoglobin 9.6 (*)    HCT 33.7 (*)    MCHC 28.5 (*)    RDW 16.8 (*)    Platelets 534 (*)    Neutro Abs 8.1 (*)    Eosinophils Absolute 0.8 (*)    Abs Immature Granulocytes 0.13 (*)    All other components within normal limits  COMPREHENSIVE METABOLIC PANEL - Abnormal; Notable for the following components:   Potassium 5.8 (*)    BUN 67 (*)    Creatinine, Ser 13.88 (*)    Calcium 7.7 (*)    Albumin 3.2 (*)    GFR calc non Af Amer 4 (*)    GFR calc Af Amer 4 (*)    Anion gap 18  (*)    All other components within normal limits  CULTURE, BLOOD (ROUTINE X 2)  CULTURE, BLOOD (ROUTINE X 2)  AEROBIC CULTURE (SUPERFICIAL SPECIMEN)  SARS CORONAVIRUS 2 BY  RT PCR (HOSPITAL ORDER, [place] [facility])  RAPID URINE DRUG SCREEN, HOSP PERFORMED    EKG None  Radiology No results found.  Procedures Procedures (including critical care time)  Medications Ordered in ED Medications  Chlorhexidine Gluconate Cloth 2 % PADS 6 each (has no administration in time range)  doxercalciferol (HECTOROL) injection 1 mcg (has no administration in time range)  sevelamer carbonate (RENVELA) tablet 800 mg (has no administration in time range)  Darbepoetin Alfa (ARANESP) injection 40 mcg (has no administration in time range)  sodium zirconium cyclosilicate (LOKELMA) packet 10 g (10 g Oral Given 02/02/20 1433)  fentaNYL (SUBLIMAZE) injection 25 mcg (25 mcg Intravenous Given 02/02/20 1442)    ED Course  I have reviewed the triage vital signs and the nursing notes.  Pertinent labs & imaging results that were available during my care of the patient were reviewed by me and considered in my medical decision making (see chart for details).  Clinical Course as of Feb 01 1501  Mon Feb 02, 2020  1501 I spoke with Dr. Roosevelt Locks who will see and admit patient to hospitalist services.   [GG]    Clinical Course User Index [GG] Corena Herter, PA-C   MDM Rules/Calculators/A&P                          Labs CBC with differential: Leukocytosis to 11.7, relatively unchanged from baseline.  Mild anemia to 9.6 hemoglobin, also consistent with baseline. CMP: Hyperkalemic to 5.8, elevated BUN 67, elevated creatinine at 13.8, hypocalcemia to 7.7, mild anion gap elevation of 18, and reduced GFR to 4.  All labs consistent with baseline ESRD. Blood cultures: Pending. Wound cultures: Pending.  We will consult vein and vascular regarding tunnel catheter.  We will then proceed to admit  patient to unassigned medicine, per request of Juanell Fairly NP, [facility].  He states that he does not have primary care provider, but is working on it.  Vascular evidently came down and saw the patient and states that they will take him to the OR once his wound culture results.  Per note of Dr. Carlis Abbott, vascular and vein, plan is for tunneled dialysis catheter today.  Suspect that his infection be best managed with IV antibiotics.  I spoke with Dr. Roosevelt Locks who will see and admit patient to hospitalist services.   Final Clinical Impression(s) / ED Diagnoses Final diagnoses:  Cellulitis of right upper extremity    Rx / DC Orders ED Discharge Orders    None       Corena Herter, PA-C 02/02/20 1502    Carmin Muskrat, MD 02/02/20 1553

## 2020-02-02 NOTE — ED Notes (Signed)
Nunzio Cory (Girlfriend)called for an update on his status.  Would like a call if any changes.

## 2020-02-02 NOTE — ED Notes (Signed)
Pt was informed of need of urine sample. Pt states he rarely produces urine

## 2020-02-02 NOTE — Anesthesia Procedure Notes (Signed)
Procedure Name: MAC Date/Time: 02/02/2020 4:38 PM Performed by: Kathryne Hitch, CRNA Pre-anesthesia Checklist: Patient identified, Emergency Drugs available, Suction available and Patient being monitored Patient Re-evaluated:Patient Re-evaluated prior to induction Oxygen Delivery Method: Nasal cannula Preoxygenation: Pre-oxygenation with 100% oxygen Induction Type: IV induction Placement Confirmation: positive ETCO2 Dental Injury: Teeth and Oropharynx as per pre-operative assessment

## 2020-02-02 NOTE — H&P (Signed)
History and Physical    Jerry [name] ZJQ:[id number] DOB: 07-06-1971 DOA: 02/02/2020  PCP: System, Pcp Not In (Confirm with patient/family/NH records and if not entered, this has to be entered at [facility] point of entry) Patient coming from: Home  I have personally briefly reviewed patient's old medical records in [place]  Chief Complaint: Right arm swelling and pain  HPI: Jerry [name] is a 49 y.o. male with medical history significant of end-stage renal disease on HD via a right arm A-V fistula, HTN, OSA presented with right arm fistula site swelling and pain for two days. 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 Recently, he had a revision of his right arm AV fistula with repair of a disrupted area on 12/26/2019 after acute bleed. Pt claimed that he was never told about the suture removal?  States has been using this without issue.  HD site has been moved a bit more proximal in the forearm to avoid touching the suture sites.  Started about 2-3 days ago, there has been some clear and thin drainage from the a-v fistula site with increasing swelling and pain. Pt also reported low grade fever at home.  ED Course: Vascular surgeon and Nephro informed. WBC 11.7.  Review of Systems: As per HPI otherwise 10 point review of systems negative.    Past Medical History:  Diagnosis Date  . Complication of anesthesia    "benadryl knocks me out; worse than any normal reactions; anesthesia/numbing RX wear off FAST" (01/30/2018)  . ESRD (end stage renal disease) on dialysis ([place])    "HAD DIALYSIS BEFORE TRANSPLANT; RESTARTED DIALYSIS 01/30/2018)  . Hypertension   . OSA on CPAP   . Renal disorder     Past Surgical History:  Procedure Laterality Date  . AV FISTULA PLACEMENT Right 2014  . AV FISTULA PLACEMENT Right 12/26/2019   Procedure: Repair and Revision of Right Forearm AV Fistula;  Surgeon: Rosetta Posner, MD;  Location:  [place];  Service: Vascular;  Laterality: Right;  . KIDNEY TRANSPLANT  2014   at [facility]  . PARATHYROIDECTOMY  2014   at [facility]     reports that he has been smoking cigars. He has smoked for the past 4.00 years. He has never used smokeless tobacco. He reports current alcohol use of about 2.0 standard drinks of alcohol per week. He reports previous drug use.  No Known Allergies  Family History  Problem Relation Age of Onset  . Heart failure Mother   . Kidney failure Father      Prior to Admission medications   Medication Sig Start Date End Date Taking? Authorizing Provider  acetaminophen (TYLENOL) 500 MG tablet Take 1,000 mg by mouth every 6 (six) hours as needed for mild pain.   Yes [provider]  calcium acetate (PHOSLO) 667 MG capsule Take 2,001 mg by mouth with breakfast, with lunch, and with evening meal. And 1 capsule with a snack 12/01/19  Yes [provider]  losartan (COZAAR) 25 MG tablet Take 25 mg by mouth at bedtime. 08/06/19  Yes [provider]  multivitamin (RENA-VIT) TABS tablet Take 1 tablet by mouth daily.   Yes [provider]  carvedilol (COREG) 25 MG tablet Take 1 tablet (25 mg total) by mouth 2 (two) times daily with a meal. 02/04/18 03/06/18  Donne Hazel, MD  oxyCODONE-acetaminophen (PERCOCET/ROXICET) 5-325 MG tablet Take 1 tablet by mouth every 6 (six)  hours as needed. Patient not taking: Reported on 02/02/2020 12/26/19   Ulyses Amor, PA-C    Physical Exam: Vitals:   02/02/20 0802 02/02/20 1042 02/02/20 1256 02/02/20 1431  BP: (!) 164/118 (!) 186/125 (!) 180/118 (!) 185/125  Pulse: 96 90 87 91  Resp: 16 17 18 15   Temp: 98.3 F (36.8 C)     TempSrc: Oral     SpO2: 100% 93% 94% 100%  Weight: 111 kg     Height: 5\' 8"  (1.727 m)       Constitutional: NAD, calm, comfortable Vitals:   02/02/20 0802 02/02/20 1042 02/02/20 1256 02/02/20 1431  BP: (!) 164/118 (!) 186/125 (!) 180/118 (!) 185/125  Pulse: 96 90 87 91  Resp: 16  17 18 15   Temp: 98.3 F (36.8 C)     TempSrc: Oral     SpO2: 100% 93% 94% 100%  Weight: 111 kg     Height: 5\' 8"  (1.727 m)      Eyes: PERRL, lids and conjunctivae normal ENMT: Mucous membranes are moist. Posterior pharynx clear of any exudate or lesions.Normal dentition.  Neck: normal, supple, no masses, no thyromegaly Respiratory: clear to auscultation bilaterally, no wheezing, no crackles. Normal respiratory effort. No accessory muscle use.  Cardiovascular: Regular rate and rhythm, no murmurs / rubs / gallops. No extremity edema. 2+ pedal pulses. No carotid bruits.  Abdomen: no tenderness, no masses palpated. No hepatosplenomegaly. Bowel sounds positive.  Musculoskeletal: no clubbing / cyanosis. No joint deformity upper and lower extremities. Good ROM, no contractures. Normal muscle tone.  Skin: Right arm swelling and and opening on the suture site with thin secretion. Tender and warm to touch. Neurologic: CN 2-12 grossly intact. Sensation intact, DTR normal. Strength 5/5 in all 4.  Psychiatric: Normal judgment and insight. Alert and oriented x 3. Normal mood.       Labs on Admission: I have personally reviewed following labs and imaging studies  CBC: Recent Labs  Lab 02/02/20 0834  WBC 11.7*  NEUTROABS 8.1*  HGB 9.6*  HCT 33.7*  MCV 96.3  PLT 161*   Basic Metabolic Panel: Recent Labs  Lab 02/02/20 0834  NA 139  K 5.8*  CL 98  CO2 23  GLUCOSE 91  BUN 67*  CREATININE 13.88*  CALCIUM 7.7*   GFR: Estimated Creatinine Clearance: 7.8 mL/min (A) (by C-G formula based on SCr of 13.88 mg/dL (H)). Liver Function Tests: Recent Labs  Lab 02/02/20 0834  AST 35  ALT 12  ALKPHOS 70  BILITOT 1.0  PROT 8.0  ALBUMIN 3.2*   No results for input(s): LIPASE, AMYLASE in the last 168 hours. No results for input(s): AMMONIA in the last 168 hours. Coagulation Profile: No results for input(s): INR, PROTIME in the last 168 hours. Cardiac Enzymes: No results for input(s):  CKTOTAL, CKMB, CKMBINDEX, TROPONINI in the last 168 hours. BNP (last 3 results) No results for input(s): PROBNP in the last 8760 hours. HbA1C: No results for input(s): HGBA1C in the last 72 hours. CBG: No results for input(s): GLUCAP in the last 168 hours. Lipid Profile: No results for input(s): CHOL, HDL, LDLCALC, TRIG, CHOLHDL, LDLDIRECT in the last 72 hours. Thyroid Function Tests: No results for input(s): TSH, T4TOTAL, FREET4, T3FREE, THYROIDAB in the last 72 hours. Anemia Panel: No results for input(s): VITAMINB12, FOLATE, FERRITIN, TIBC, IRON, RETICCTPCT in the last 72 hours. Urine analysis: No results found for: COLORURINE, APPEARANCEUR, LABSPEC, [place], GLUCOSEU, [place], [place], [place], PROTEINUR, [place], NITRITE, [place]  Radiological Exams  on Admission: No results found.  ASU:ORVI  Assessment/Plan Active Problems:   Cellulitis   Right arm cellulitis  (please populate well all problems here in Problem List. (For example, if patient is on BP meds at home and you resume or decide to hold them, it is a problem that needs to be her. Same for CAD, COPD, HLD and so on)  Right arm cellulitis -Secondary to suture/foreign body, check x-ray for underneath the foreign body/gas. -For now given the patient's description and local finding, will start patient on IV doxycycline consider switch to p.o. if vital signs and WBC remained stable. MRSA screen and ASO -Vascular surgery consultation appreciated, plan is for right arm abscess to to rest and use a temporary tunneled catheter for HD until the right arm wound healed -Consult wound care, also suspect there is a superimposed dermatitis. -UDS  ESRD on HD -With hyperkalemia and poorly controlled hypertension, start HD ASAP after temporary tunneled catheter placed -Nephrology on board  Uncontrolled hypertension -As above  OSA -CPAP at bedtime  DVT prophylaxis: SCD Code Status: Full Family Communication:  Significant other at bedside Disposition Plan: Given the crucial role of right forearm for dialysis, plan for IV antibiotics, likely can be charged 1 to 2 days Consults called: Vascular surgery and nephrology Admission status: MedSurg admission   Lequita Halt MD [facility] Pager [phone]  02/02/2020, 3:12 PM

## 2020-02-02 NOTE — ED Triage Notes (Signed)
Pt. Stated, I had surgery on my rt. Arm for a fistula and I think its infected. Its been used for dialysis and they did not want to stick it today told me to come here.

## 2020-02-02 NOTE — Consult Note (Signed)
Hospital Consult    Reason for Consult: Concern for infected right arm AV fistula revision Referring Physician: ED and nephrology MRN #:  [id number]  History of Present Illness: This is a 49 y.o. male with end-stage renal disease who presents from dialysis with concern for infection in his right forearm AV fistula revision site.  Patient has a right radiocephalic AV fistula that was placed about 10 years ago in [place].  States has been using this without issue.  He recently had revision of his right arm AV fistula with repair of a disrupted area by Dr. Donnetta Hutching on 12/26/2019 for acute bleed.  Ultimately they have been using the fistula again more proximal in the forearm and have been sticking through a buttonhole.  There has been some drainage from the buttonhole.  Past Medical History:  Diagnosis Date   Complication of anesthesia    "benadryl knocks me out; worse than any normal reactions; anesthesia/numbing RX wear off FAST" (01/30/2018)   ESRD (end stage renal disease) on dialysis ([place])    "HAD DIALYSIS BEFORE TRANSPLANT; RESTARTED DIALYSIS 01/30/2018)   Hypertension    OSA on CPAP    Renal disorder     Past Surgical History:  Procedure Laterality Date   AV FISTULA PLACEMENT Right 2014   AV FISTULA PLACEMENT Right 12/26/2019   Procedure: Repair and Revision of Right Forearm AV Fistula;  Surgeon: Rosetta Posner, MD;  Location: [facility] OR;  Service: Vascular;  Laterality: Right;   KIDNEY TRANSPLANT  2014   at [place]  2014   at [facility]    No Known Allergies  Prior to Admission medications   Medication Sig Start Date End Date Taking? Authorizing Provider  acetaminophen (TYLENOL) 500 MG tablet Take 1,000 mg by mouth every 6 (six) hours as needed for mild pain.    [provider]  calcium acetate (PHOSLO) 667 MG capsule Take 2,001 mg by mouth with breakfast, with lunch, and with evening meal. And 1 capsule with a snack 12/01/19   [provider]   carvedilol (COREG) 25 MG tablet Take 1 tablet (25 mg total) by mouth 2 (two) times daily with a meal. 02/04/18 03/06/18  Donne Hazel, MD  losartan (COZAAR) 25 MG tablet Take 25 mg by mouth at bedtime. 08/06/19   [provider]  multivitamin (RENA-VIT) TABS tablet Take 1 tablet by mouth daily.    [provider]  oxyCODONE-acetaminophen (PERCOCET/ROXICET) 5-325 MG tablet Take 1 tablet by mouth every 6 (six) hours as needed. 12/26/19   Ulyses Amor, PA-C    Social History   Socioeconomic History   Marital status: Divorced    Spouse name: Not on file   Number of children: Not on file   Years of education: Not on file   Highest education level: Not on file  Occupational History   Not on file  Tobacco Use   Smoking status: Current Some Day Smoker    Years: 4.00    Types: Cigars   Smokeless tobacco: Never Used  Vaping Use   Vaping Use: Some days  Substance and Sexual Activity   Alcohol use: Yes    Alcohol/week: 2.0 standard drinks    Types: 2 Standard drinks or equivalent per week   Drug use: Not Currently   Sexual activity: Yes  Other Topics Concern   Not on file  Social History Narrative   Not on file   Social Determinants of Health   Financial Resource Strain:  Difficulty of Paying Living Expenses:   Food Insecurity:    Worried About Charity fundraiser in the Last Year:    Arboriculturist in the Last Year:   Transportation Needs:    Film/video editor (Medical):    Lack of Transportation (Non-Medical):   Physical Activity:    Days of Exercise per Week:    Minutes of Exercise per Session:   Stress:    Feeling of Stress :   Social Connections:    Frequency of Communication with Friends and Family:    Frequency of Social Gatherings with Friends and Family:    Attends Religious Services:    Active Member of Clubs or Organizations:    Attends Music therapist:    Marital Status:   Intimate Partner  Violence:    Fear of Current or Ex-Partner:    Emotionally Abused:    Physically Abused:    Sexually Abused:      Family History  Problem Relation Age of Onset   Heart failure Mother    Kidney failure Father     ROS: [x]  Positive   [ ]  Negative   [ ]  All sytems reviewed and are negative  Cardiovascular: []  chest pain/pressure []  palpitations []  SOB lying flat []  DOE []  pain in legs while walking []  pain in legs at rest []  pain in legs at night []  non-healing ulcers []  hx of DVT []  swelling in legs  Pulmonary: []  productive cough []  asthma/wheezing []  home O2  Neurologic: []  weakness in []  arms []  legs []  numbness in []  arms []  legs []  hx of CVA []  mini stroke [] difficulty speaking or slurred speech []  temporary loss of vision in one eye []  dizziness  Hematologic: []  hx of cancer []  bleeding problems []  problems with blood clotting easily  Endocrine:   []  diabetes []  thyroid disease  GI []  vomiting blood []  blood in stool  GU: []  CKD/renal failure []  HD--[]  M/W/F or []  T/T/S []  burning with urination []  blood in urine  Psychiatric: []  anxiety []  depression  Musculoskeletal: []  arthritis []  joint pain  Integumentary: []  rashes []  ulcers  Constitutional: []  fever []  chills   Physical Examination  Vitals:   02/02/20 1042 02/02/20 1256  BP: (!) 186/125 (!) 180/118  Pulse: 90 87  Resp: 17 18  Temp:    SpO2: 93% 94%   Body mass index is 37.21 kg/m.  General:  WDWN in NAD Gait: Not observed HENT: WNL, normocephalic Pulmonary: normal non-labored breathing, without Rales, rhonchi,  wheezing Cardiac: regular, without  Murmurs, rubs or gallops Abdomen: soft, NT/ND, no masses Vascular Exam/Pulses: Right radiocephalic fistula with good thrill.  Nylon sutures in the incision with no overt drainage.  There is a more proximal buttonhole site in the forearm with some exudate.  Musculoskeletal: no muscle wasting or atrophy  Neurologic:  A&O X 3; Appropriate Affect ; SENSATION: normal; MOTOR FUNCTION:  moving all extremities equally. Speech is fluent/normal   CBC    Component Value Date/Time   WBC 11.7 (H) 02/02/2020 0834   RBC 3.50 (L) 02/02/2020 0834   HGB 9.6 (L) 02/02/2020 0834   HCT 33.7 (L) 02/02/2020 0834   PLT 534 (H) 02/02/2020 0834   MCV 96.3 02/02/2020 0834   MCH 27.4 02/02/2020 0834   MCHC 28.5 (L) 02/02/2020 0834   RDW 16.8 (H) 02/02/2020 0834   LYMPHSABS 1.8 02/02/2020 0834   MONOABS 0.7 02/02/2020 0834   EOSABS 0.8 (H) 02/02/2020  7544   BASOSABS 0.1 02/02/2020 0834    BMET    Component Value Date/Time   NA 139 02/02/2020 0834   K 5.8 (H) 02/02/2020 0834   CL 98 02/02/2020 0834   CO2 23 02/02/2020 0834   GLUCOSE 91 02/02/2020 0834   BUN 67 (H) 02/02/2020 0834   CREATININE 13.88 (H) 02/02/2020 0834   CALCIUM 7.7 (L) 02/02/2020 0834   GFRNONAA 4 (L) 02/02/2020 0834   GFRAA 4 (L) 02/02/2020 0834    COAGS: Lab Results  Component Value Date   INR 1.0 12/11/2019     Non-Invasive Vascular Imaging:         ASSESSMENT/PLAN: This is a 49 y.o. male with end-stage renal disease that presents to the ED with concern for infection in his right forearm AV fistula revision site.  Discussed with the patient and his wife that plan would be placement of tunneled dialysis catheter today to rest his fistula.  I do see some exudate at the new buttonhole site more proximal in the forearm but overall there is not a lot of erythema and purulent drainage and this may be managed with IV antibiotics and have a chance to heal if it is rested.  Please keep n.p.o.  Covid test is pending.  Have posted for this afternoon.  Needs dialysis today given K 5.8 per nephrology.  Marty Heck, MD [facility] Office: [place]

## 2020-02-02 NOTE — Consult Note (Signed)
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  Wound type:infectious.  Recent AV fistula revision.  Pressure Injury POA: NA Measurement:  AV fistula site with purulence and warmth Wound bed:not visible Drainage (amount, consistency, odor) some purulence Periwound:Chronic rash Dressing procedure/placement/frequency: Cleanse right forearm wound with NS.  Apply Xeroform gauze and cover with dry dressing and kerlix/tape.  Change daily.  Will not follow at this time.  Please re-consult if needed.  Domenic Moras MSN, RN, FNP-BC CWON Wound, Ostomy, Continence Nurse Pager [phone]

## 2020-02-02 NOTE — ED Notes (Signed)
Pt transported to xray 

## 2020-02-02 NOTE — Consult Note (Addendum)
[facility] Renal Consultation Note    Indication for Consultation:  Management of ESRD/hemodialysis; anemia, hypertension/volume and secondary hyperparathyroidism PCP:  HPI: Jerry [name] is a 49 y.o. male with ESRD on hemodialysis MWF at [facility]. 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 OSA on CPAP.SHPT S/P parathyroidectomy, anemia of chronic disease. He had revision of AVF 12/26/2019 per Dr. Donnetta Hutching.  Last HD 01/30/2020. He was seen in HD clinic 01/30/2020 for complaints of tape rash and feeling like he was "getting sick and need an antibiotic". He was afebrile, no symptoms of acute illness, did not appear toxic. AVF was cannulated and was unable to visualize buttonhole or sutures from prior revision in June. Today he presented to HD unit and was noted to have erythema, swelling, warmth and purulent drainage from upper buttonhole. He was sent to ED for further evaluation.   Seen in ED waiting room. Able to move to ED hallway. AVF is swollen with purulent drainage from upper button hole (lower button hole closed during revision of AVF 12/26/2019-sutures still in place but suture line disrupted D/T swelling). He has thickened lichen type skin over AVF from tape allergy which actually looks better. He is afebrile. He is noted to have K+ 5.8-says he ate watermelon over weekend. He denies fever, chills, N,V,D, chest pain, is slightly SOB but his wt this AM at HD center was 114.6 kg so he does have extra volume on board. VVS notified-spoke with Dr. Carlis Abbott. Discussed plan of care with ED PA.   Past Medical History:  Diagnosis Date  . Complication of anesthesia    "benadryl knocks me out; worse than any normal reactions; anesthesia/numbing RX wear off FAST" (01/30/2018)  . ESRD (end stage renal disease) on dialysis ([place])    "HAD DIALYSIS BEFORE TRANSPLANT; RESTARTED DIALYSIS 01/30/2018)  . Hypertension   . OSA on CPAP   . Renal disorder    Past Surgical History:   Procedure Laterality Date  . AV FISTULA PLACEMENT Right 2014  . AV FISTULA PLACEMENT Right 12/26/2019   Procedure: Repair and Revision of Right Forearm AV Fistula;  Surgeon: Rosetta Posner, MD;  Location: [place];  Service: Vascular;  Laterality: Right;  . KIDNEY TRANSPLANT  2014   at [facility]  . PARATHYROIDECTOMY  2014   at [facility]   Family History  Problem Relation Age of Onset  . Heart failure Mother   . Kidney failure Father    Social History:  reports that he has been smoking cigars. He has smoked for the past 4.00 years. He has never used smokeless tobacco. He reports current alcohol use of about 2.0 standard drinks of alcohol per week. He reports previous drug use. No Known Allergies Prior to Admission medications   Medication Sig Start Date End Date Taking? Authorizing Provider  acetaminophen (TYLENOL) 500 MG tablet Take 1,000 mg by mouth every 6 (six) hours as needed for mild pain.    [provider]  calcium acetate (PHOSLO) 667 MG capsule Take 2,001 mg by mouth with breakfast, with lunch, and with evening meal. And 1 capsule with a snack 12/01/19   [provider]  carvedilol (COREG) 25 MG tablet Take 1 tablet (25 mg total) by mouth 2 (two) times daily with a meal. 02/04/18 03/06/18  Donne Hazel, MD  losartan (COZAAR) 25 MG tablet Take 25 mg by mouth at bedtime. 08/06/19   [provider]  multivitamin (RENA-VIT) TABS tablet Take 1 tablet by mouth daily.  [provider]  oxyCODONE-acetaminophen (PERCOCET/ROXICET) 5-325 MG tablet Take 1 tablet by mouth every 6 (six) hours as needed. 12/26/19   Ulyses Amor, PA-C   Current Facility-Administered Medications  Medication Dose Route Frequency Provider Last Rate Last Admin  . [START ON 02/03/2020] Chlorhexidine Gluconate Cloth 2 % PADS 6 each  6 each Topical Q0600 Valentina Gu, NP      . Derrill Memo ON 02/04/2020] doxercalciferol (HECTOROL) injection 1 mcg  1 mcg Intravenous Q M,W,F-HD Valentina Gu, NP      . fentaNYL (SUBLIMAZE) injection 25 mcg  25 mcg Intravenous Once Corena Herter, PA-C      . sevelamer carbonate (RENVELA) tablet 800 mg  800 mg Oral TID WC Valentina Gu, NP      . sodium zirconium cyclosilicate (LOKELMA) packet 10 g  10 g Oral Once Valentina Gu, NP       Current Outpatient Medications  Medication Sig Dispense Refill  . acetaminophen (TYLENOL) 500 MG tablet Take 1,000 mg by mouth every 6 (six) hours as needed for mild pain.    . calcium acetate (PHOSLO) 667 MG capsule Take 2,001 mg by mouth with breakfast, with lunch, and with evening meal. And 1 capsule with a snack    . carvedilol (COREG) 25 MG tablet Take 1 tablet (25 mg total) by mouth 2 (two) times daily with a meal. 60 tablet 0  . losartan (COZAAR) 25 MG tablet Take 25 mg by mouth at bedtime.    . multivitamin (RENA-VIT) TABS tablet Take 1 tablet by mouth daily.    Marland Kitchen oxyCODONE-acetaminophen (PERCOCET/ROXICET) 5-325 MG tablet Take 1 tablet by mouth every 6 (six) hours as needed. 12 tablet 0   Labs: Basic Metabolic Panel: Recent Labs  Lab 02/02/20 0834  NA 139  K 5.8*  CL 98  CO2 23  GLUCOSE 91  BUN 67*  CREATININE 13.88*  CALCIUM 7.7*   Liver Function Tests: Recent Labs  Lab 02/02/20 0834  AST 35  ALT 12  ALKPHOS 70  BILITOT 1.0  PROT 8.0  ALBUMIN 3.2*   No results for input(s): LIPASE, AMYLASE in the last 168 hours. No results for input(s): AMMONIA in the last 168 hours. CBC: Recent Labs  Lab 02/02/20 0834  WBC 11.7*  NEUTROABS 8.1*  HGB 9.6*  HCT 33.7*  MCV 96.3  PLT 534*   Cardiac Enzymes: No results for input(s): CKTOTAL, CKMB, CKMBINDEX, TROPONINI in the last 168 hours. CBG: No results for input(s): GLUCAP in the last 168 hours. Iron Studies: No results for input(s): IRON, TIBC, TRANSFERRIN, FERRITIN in the last 72 hours. Studies/Results: No results found.  ROS: As per HPI otherwise negative.   Physical Exam: Vitals:   02/02/20 0802  02/02/20 1042 02/02/20 1256  BP: (!) 164/118 (!) 186/125 (!) 180/118  Pulse: 96 90 87  Resp: 16 17 18   Temp: 98.3 F (36.8 C)    TempSrc: Oral    SpO2: 100% 93% 94%  Weight: 111 kg    Height: 5\' 8"  (1.727 m)       General: Well developed, well nourished, in no acute distress. Head: Normocephalic, atraumatic, sclera non-icteric, mucus membranes are moist Neck: Supple. JVD not elevated. Lungs: Clear bilaterally to auscultation without wheezes, rales, or rhonchi. Breathing is unlabored. Heart: RRR with S1 S2. No murmurs, rubs, or gallops appreciated. Abdomen: Soft, non-tender, non-distended with normoactive bowel sounds. No rebound/guarding. No obvious abdominal masses. M-S:  Strength and tone appear normal for age.  Lower extremities: Trace BLE edema.  Neuro: Alert and oriented X 3. Moves all extremities spontaneously. Psych:  Responds to questions appropriately with a normal affect. Dialysis Access: R AVF with purulent drainage from upper button hole. Swelling present upper portion of AVF, warm to touch. Lichen type rash around AVF from tape.   Dialysis Orders: Center: [facility] MWF 4 hrs 180NRe 425/800 111 kg 2.0 K/ 2.25 Ca UFP 2  R AVF -Heparin 3000 units IV TIW -Hectorol 1 mcg IV TIW -Venofer 100 mg IV X 10 doses (3/10 doses given).  -Mircera 75 mcg IV q 4 weeks (last dose 01/12/2020)   Assessment/Plan: 1.  Infected AVF-VVS called. Will need temp cath. Wound cultures/BC ordered. ABX per primary.  2.  ESRD -  MWF. He is compliant with HD-went to HD center today but unable to cannulate AVF. K+5.8. Lokelma 10 Grams ordered. Repeat RFP tonight at 1800. May need repeat Lokelma if unable to get HD today.  3.  Hypertension/volume  - Hypertensive on arrival to ED but he is usually hypertensive prior to HD. Losartan 25 mg PO q hs on OP med list but doesn't take before HD. Pre wt at OP center today 114.6 kg. UF as tolerated to OP EDW.  4.  Anemia  -HGB 9.6. Has been getting Fe load which will  hold in setting of infection. Follow HGB. ESA actually due next week but will redose with HD as he is unable to get Fe load.  5.  Metabolic bone disease -  Ca 7.7 C Ca 8.3 On calcium binders, continue VDRA. S/P parathyroidectomy. 6.  Nutrition -NPO at present. Renal diet with protein supps when able to eat.   Juelz Whittenberg H. Owens Shark, NP-C 02/02/2020, 1:57 PM  [employer] [phone]

## 2020-02-02 NOTE — Transfer of Care (Signed)
Immediate Anesthesia Transfer of Care Note  Patient: Jerry [name]  Procedure(s) Performed: INSERTION OF LEFT INTERNAL JUGULAR 28 CM TUNNELED DIALYSIS CATHETER UNDER ULTRASOUND GUIDANCE (Left )  Patient Location: PACU  Anesthesia Type:MAC  Level of Consciousness: awake, alert  and patient cooperative  Airway & Oxygen Therapy: Patient Spontanous Breathing  Post-op Assessment: Report given to RN and Post -op Vital signs reviewed and stable  Post vital signs: Reviewed and stable  Last Vitals:  Vitals Value Taken Time  BP 193/96 02/02/20 1721  Temp    Pulse 81 02/02/20 1732  Resp 18 02/02/20 1732  SpO2 96 % 02/02/20 1732  Vitals shown include unvalidated device data.  Last Pain:  Vitals:   02/02/20 1433  TempSrc:   PainSc: 3          Complications: No complications documented.

## 2020-02-02 NOTE — Anesthesia Preprocedure Evaluation (Addendum)
Anesthesia Evaluation  Patient identified by MRN, date of birth, ID band Patient awake    Reviewed: Allergy & Precautions, NPO status , Patient's Chart, lab work & pertinent test results  History of Anesthesia Complications (+) history of anesthetic complications  Airway Mallampati: III  TM Distance: >3 FB Neck ROM: Full    Dental  (+) Dental Advisory Given, Teeth Intact   Pulmonary sleep apnea and Continuous Positive Airway Pressure Ventilation , Current Smoker and Patient abstained from smoking.,    Pulmonary exam normal        Cardiovascular hypertension, Pt. on medications  Rhythm:Regular Rate:Tachycardia   '19 TTE - Moderate concentric LVH. EF 55% to 60%. Grade 2 diastolic dysfunction. Ascending aortic diameter: 39 mm (S).  Mild MR. LA was severely dilated. Mild TR. A small pericardial effusion was identified.     Neuro/Psych negative neurological ROS  negative psych ROS   GI/Hepatic negative GI ROS, Neg liver ROS,   Endo/Other   Potassium 5.2   Renal/GU ESRF and DialysisRenal disease Previous renal tx, now back on dialysis      Musculoskeletal negative musculoskeletal ROS (+)   Abdominal   Peds  Hematology negative hematology ROS (+) anemia ,   Anesthesia Other Findings Covid test negative 6/11   Reproductive/Obstetrics                            Anesthesia Physical  Anesthesia Plan  ASA: IV  Anesthesia Plan: MAC   Post-op Pain Management:    Induction:   PONV Risk Score and Plan: 2 and Treatment may vary due to age or medical condition, Ondansetron, Dexamethasone and Midazolam  Airway Management Planned: Simple Face Mask  Additional Equipment: None  Intra-op Plan:   Post-operative Plan:   Informed Consent: I have reviewed the patients History and Physical, chart, labs and discussed the procedure including the risks, benefits and alternatives for the proposed  anesthesia with the patient or authorized representative who has indicated his/her understanding and acceptance.     Dental advisory given  Plan Discussed with: CRNA and Anesthesiologist  Anesthesia Plan Comments:         Anesthesia Quick Evaluation

## 2020-02-02 NOTE — ED Notes (Signed)
Called for reassess vitals no answer

## 2020-02-03 ENCOUNTER — Encounter (HOSPITAL_COMMUNITY): Payer: Self-pay | Admitting: Vascular Surgery

## 2020-02-03 LAB — CBC
HCT: 32.9 % — ABNORMAL LOW (ref 39.0–52.0)
Hemoglobin: 9.8 g/dL — ABNORMAL LOW (ref 13.0–17.0)
MCH: 28.1 pg (ref 26.0–34.0)
MCHC: 29.8 g/dL — ABNORMAL LOW (ref 30.0–36.0)
MCV: 94.3 fL (ref 80.0–100.0)
Platelets: 484 10*3/uL — ABNORMAL HIGH (ref 150–400)
RBC: 3.49 MIL/uL — ABNORMAL LOW (ref 4.22–5.81)
RDW: 17.1 % — ABNORMAL HIGH (ref 11.5–15.5)
WBC: 9.9 10*3/uL (ref 4.0–10.5)
nRBC: 0 % (ref 0.0–0.2)

## 2020-02-03 LAB — BASIC METABOLIC PANEL
Anion gap: 14 (ref 5–15)
BUN: 33 mg/dL — ABNORMAL HIGH (ref 6–20)
CO2: 25 mmol/L (ref 22–32)
Calcium: 7.8 mg/dL — ABNORMAL LOW (ref 8.9–10.3)
Chloride: 97 mmol/L — ABNORMAL LOW (ref 98–111)
Creatinine, Ser: 8.04 mg/dL — ABNORMAL HIGH (ref 0.61–1.24)
GFR calc Af Amer: 8 mL/min — ABNORMAL LOW (ref >60)
GFR calc non Af Amer: 7 mL/min — ABNORMAL LOW (ref >60)
Glucose, Bld: 126 mg/dL — ABNORMAL HIGH (ref 70–99)
Potassium: 5.1 mmol/L (ref 3.5–5.1)
Sodium: 136 mmol/L (ref 135–145)

## 2020-02-03 LAB — HIV ANTIBODY (ROUTINE TESTING W REFLEX): HIV Screen 4th Generation wRfx: NONREACTIVE

## 2020-02-03 MED ORDER — HEPARIN SODIUM (PORCINE) 1000 UNIT/ML IJ SOLN
INTRAMUSCULAR | Status: AC
  Administered 2020-02-03: 4200 [IU]
  Filled 2020-02-03: qty 5

## 2020-02-03 MED ORDER — DARBEPOETIN ALFA 40 MCG/0.4ML IJ SOSY
40.0000 ug | PREFILLED_SYRINGE | INTRAMUSCULAR | Status: DC
  Filled 2020-02-03: qty 0.4

## 2020-02-03 MED ORDER — SODIUM ZIRCONIUM CYCLOSILICATE 10 G PO PACK
10.0000 g | PACK | Freq: Once | ORAL | Status: AC
  Administered 2020-02-03: 10 g via ORAL
  Filled 2020-02-03: qty 1

## 2020-02-03 NOTE — Progress Notes (Addendum)
  Progress Note    02/03/2020 7:39 AM 1 Day Post-Op  Subjective:  L IJ TDC worked well during dialysis yesterday   Vitals:   02/03/20 0135 02/03/20 0338  BP: (!) 141/98 (!) 130/102  Pulse: 98 92  Resp: 20 16  Temp: 97.8 F (36.6 C) 97.9 F (36.6 C)  SpO2: 99% 100%   Physical Exam: Lungs:  Non labored Incisions:  L IJ TDC without hematoma Extremities:  R forearm without active drainage Neurologic: A&O  CBC    Component Value Date/Time   WBC 9.9 02/03/2020 0415   RBC 3.49 (L) 02/03/2020 0415   HGB 9.8 (L) 02/03/2020 0415   HCT 32.9 (L) 02/03/2020 0415   PLT 484 (H) 02/03/2020 0415   MCV 94.3 02/03/2020 0415   MCH 28.1 02/03/2020 0415   MCHC 29.8 (L) 02/03/2020 0415   RDW 17.1 (H) 02/03/2020 0415   LYMPHSABS 1.8 02/02/2020 0834   MONOABS 0.7 02/02/2020 0834   EOSABS 0.8 (H) 02/02/2020 0834   BASOSABS 0.1 02/02/2020 0834    BMET    Component Value Date/Time   NA 136 02/03/2020 0415   K 5.1 02/03/2020 0415   CL 97 (L) 02/03/2020 0415   CO2 25 02/03/2020 0415   GLUCOSE 126 (H) 02/03/2020 0415   BUN 33 (H) 02/03/2020 0415   CREATININE 8.04 (H) 02/03/2020 0415   CALCIUM 7.8 (L) 02/03/2020 0415   GFRNONAA 7 (L) 02/03/2020 0415   GFRAA 8 (L) 02/03/2020 0415    INR    Component Value Date/Time   INR 1.0 12/11/2019 1928     Intake/Output Summary (Last 24 hours) at 02/03/2020 0739 Last data filed at 02/03/2020 0135 Gross per 24 hour  Intake 665 ml  Output 3050 ml  Net -2385 ml     Assessment/Plan:  49 y.o. male is s/p L IJ TDC 1 Day Post-Op   L IJ TDC working well; no bleeding or hematoma noted on chest wall Continue IV antibiotics Plan is to rest R forearm fistula; call with questions   Dagoberto Ligas, PA-C [facility] [phone] 02/03/2020 7:39 AM  I have seen and evaluated the patient. I agree with the PA note as documented above.  Now postop day 1 status post placement of left IJ tunneled dialysis catheter to rest his  right forearm fistula revision site given initial concern for infection.  Catheter worked well yesterday and there is no hematoma or other chest wall bleeding.  I think he needs at least 24 more hours of IV antibiotics and then likely tomorrow could be transitioned to p.o. antibiotics with short interval follow-up in the office.  I am hopeful we can salvage the fistula if this is rested.  Marty Heck, MD [facility] [place] Office: [phone]

## 2020-02-03 NOTE — Progress Notes (Signed)
  [facility] Progress Note   Subjective:  L IJ TDC placed by Dr. Carlis Abbott yesterday and completed dialysis early early this am with 3L UF. Seen in room, no complaints this am, cramped at the end of tmt yesterday.   Objective Vitals:   02/03/20 0130 02/03/20 0135 02/03/20 0338 02/03/20 0825  BP: (!) 133/91 (!) 141/98 (!) 130/102 (!) 148/107  Pulse: 98 98 92 99  Resp:  20 16 12   Temp:  97.8 F (36.6 C) 97.9 F (36.6 C) 97.7 F (36.5 C)  TempSrc:  Oral Oral Oral  SpO2:  99% 100% 99%  Weight:  115.4 kg    Height:        Physical Exam General: Well appearing, nad  Heart: RRR Lungs: Clear bilaterally  Abdomen: soft non-tender  Extremities: no LE edema  Dialysis Access: R AVF, no active drainage, dry crusting rash surrounding; new L IJ TDC dsg c,d,i   Additional Objective Labs: Basic Metabolic Panel: Recent Labs  Lab 02/02/20 0834 02/03/20 0415  NA 139 136  K 5.8* 5.1  CL 98 97*  CO2 23 25  GLUCOSE 91 126*  BUN 67* 33*  CREATININE 13.88* 8.04*  CALCIUM 7.7* 7.8*   CBC: Recent Labs  Lab 02/02/20 0834 02/03/20 0415  WBC 11.7* 9.9  NEUTROABS 8.1*  --   HGB 9.6* 9.8*  HCT 33.7* 32.9*  MCV 96.3 94.3  PLT 534* 484*   Blood Culture    Component Value Date/Time   SDES BLOOD LEFT HAND 02/02/2020 1410   SPECREQUEST  02/02/2020 1410    BOTTLES DRAWN AEROBIC AND ANAEROBIC Blood Culture adequate volume   CULT  02/02/2020 1410    NO GROWTH < 24 HOURS Performed at [facility], [place] [address] [place], [place] [address]    REPTSTATUS PENDING 02/02/2020 1410     Medications: . sodium chloride    . doxycycline (VIBRAMYCIN) IV 100 mg (02/03/20 6333)   . Chlorhexidine Gluconate Cloth  6 each Topical Q0600  . darbepoetin (ARANESP) injection - DIALYSIS  40 mcg Intravenous Q Mon-HD  . [START ON 02/04/2020] doxercalciferol  1 mcg Intravenous Q M,W,F-HD  . losartan  25 mg Oral Once  . sevelamer carbonate  800 mg Oral TID WC    Dialysis Orders:  AF   MWF 4 hrs 180NRe 425/800 111 kg 2.0 K/ 2.25 Ca UFP 2  R AVF Heparin 3000  -Hectorol 1 mcg IV TIW -Venofer 100 mg IV X 10 doses (3/10 doses given).  -Mircera 75 mcg IV q 4 weeks (last dose 01/12/2020)  Assessment/Plan: 1. Erythema/swelling R AVF - Initially concern for infection. IV antibiotics started. Blood culture ngtd. Wound culture pending. Had L IJ TDC placed per VVS 7/19. Plan is to rest fistula.   2.  ESRD -  HD MWF. Continue on schedule. Next HD 7/21. K+ 5.1 this am post HD.  3. Hypertension/volume  - Hypertensive. On losartan 25 qhs.  Not to yet to dry weight. UF as tolerated to OP EDW.  4. Anemia  -Holding IV Fe 2/2 possible infection. Follow HGB. ESA actually due next week but will redose with HD as he is unable to get Fe load.  5. Metabolic bone disease -  On calcium binders, continue VDRA. S/P parathyroidectomy. 6.  Nutrition Renal diet with protein supps when able to eat.   Lynnda Child PA-C  [facility] 02/03/2020,9:47 AM

## 2020-02-03 NOTE — Progress Notes (Signed)
PROGRESS NOTE    Jerry [name]  SVX:[id number] DOB: [birth year] DOA: 02/02/2020 PCP: System, Pcp Not In   Brief Narrative:  Patient is a 49 year old male with history of end-stage renal disease on dialysis via right arm AV fistula, hypertension, OSA who presented with right arm fistula site swelling and pain for 2 days.  Recently he had a revision of his right arm AV fistula on 12/26/2019 for acute bleeding.  He was also complaining of drainage from the AV fistula site and also low-grade fever at home.  On presentation, he had mild leukocytosis.  Vascular surgery and nephrology consulted.  Started on broad-spectrum antibiotics.  Plan for discharge tomorrow to home with oral antibiotics after vascular surgery clearance.  Assessment & Plan:   Active Problems:   Cellulitis   Right arm cellulitis   Right arm AV fistula site cellulitis: .  Patient has a right radiocephalic AV fistula that was placed about 10 years ago.  Recently had a revision of this right arm AV fistula.  Presented with pains, swelling, drainage from the fistula site.  Started on IV doxycycline.  Vascular surgery consulted and following.  Plan is to continue monitoring with antibiotics.  Wound care consulted. Possible plan for discharge tomorrow to home with oral antibiotics. Wound culture has shown mixed organisms but mainly staph aureus.  We will follow-up susceptibility to guide antibiotic therapy. He does not have any toxic symptoms so keeping on doxycycline is appropriate for now.  ESRD on dialysis: Nephrology following.  Underwent tunnel catheter placement for dialysis access in order to rest the fistula.  We will continue dialysis here.  Next dialysis planned for tomorrow.  Hyperkalemia: Resolved with dialysis.  Also given Lokelma.  Uncontrolled hypertension: Continue to monitor blood pressure.  Continue current medications  Normocytic anemia: Associated with CKD.  Continue to monitor.  On ESA.  Metabolic bone  disease: Continue binders  OSA: On CPAP at bedtime          DVT prophylaxis: heparin [place] Code Status: Full Family Communication: Discussed in detail with this young patient. Status is: Inpatient  Remains inpatient appropriate because:IV treatments appropriate due to intensity of illness or inability to take PO   Dispo: The patient is from: Home              Anticipated d/c is to: Home              Anticipated d/c date is: tomorrow              Patient currently is not medically stable to d/c. Needs clearance from vascular surgery before discharge    Consultants: Vascular surgery, nephrology  Procedures: Tunneled catheter placement  Antimicrobials:  Anti-infectives (From admission, onward)   Start     Dose/Rate Route Frequency Ordered Stop   02/02/20 1530  doxycycline (VIBRAMYCIN) 100 mg in sodium chloride 0.9 % 250 mL IVPB     Discontinue     100 mg 125 mL/hr over 120 Minutes Intravenous Every 12 hours 02/02/20 1510        Subjective:  Patient seen and examined at the bedside this morning.  Medically stable.  Comfortable.  Denies any new complaints.  Wound on the fistula site is drying nicely, less drainage, no erythema observed. Objective: Vitals:   02/03/20 0100 02/03/20 0130 02/03/20 0135 02/03/20 0338  BP: (!) 140/100 (!) 133/91 (!) 141/98 (!) 130/102  Pulse: (!) 101 98 98 92  Resp:   20 16  Temp:   97.8 F (  36.6 C) 97.9 F (36.6 C)  TempSrc:   Oral Oral  SpO2:   99% 100%  Weight:   115.4 kg   Height:        Intake/Output Summary (Last 24 hours) at 02/03/2020 0825 Last data filed at 02/03/2020 0135 Gross per 24 hour  Intake 665 ml  Output 3050 ml  Net -2385 ml   Filed Weights   02/02/20 2017 02/02/20 2122 02/03/20 0135  Weight: 117.4 kg 118.3 kg 115.4 kg    Examination:  General exam: Appears calm and comfortable ,Not in distress, morbidly obese HEENT:PERRL,Oral mucosa moist, Ear/Nose normal on gross exam Respiratory system: Bilateral equal  air entry, normal vesicular breath sounds, no wheezes or crackles  Cardiovascular system: S1 & S2 heard, RRR. No JVD, murmurs, rubs, gallops or clicks. No pedal edema.  Temporary dialysis catheter on the left chest Gastrointestinal system: Abdomen is nondistended, soft and nontender. No organomegaly or masses felt. Normal bowel sounds heard. Central nervous system: Alert and oriented. No focal neurological deficits. Extremities: No edema, no clubbing ,no cyanosis, AV fistula wound on the right forearm Skin: No rashes, lesions or ulcers,no icterus ,no pallor   Data Reviewed: I have personally reviewed following labs and imaging studies  CBC: Recent Labs  Lab 02/02/20 0834 02/03/20 0415  WBC 11.7* 9.9  NEUTROABS 8.1*  --   HGB 9.6* 9.8*  HCT 33.7* 32.9*  MCV 96.3 94.3  PLT 534* 268*   Basic Metabolic Panel: Recent Labs  Lab 02/02/20 0834 02/03/20 0415  NA 139 136  K 5.8* 5.1  CL 98 97*  CO2 23 25  GLUCOSE 91 126*  BUN 67* 33*  CREATININE 13.88* 8.04*  CALCIUM 7.7* 7.8*   GFR: Estimated Creatinine Clearance: 13.7 mL/min (A) (by C-G formula based on SCr of 8.04 mg/dL (H)). Liver Function Tests: Recent Labs  Lab 02/02/20 0834  AST 35  ALT 12  ALKPHOS 70  BILITOT 1.0  PROT 8.0  ALBUMIN 3.2*   No results for input(s): LIPASE, AMYLASE in the last 168 hours. No results for input(s): AMMONIA in the last 168 hours. Coagulation Profile: No results for input(s): INR, PROTIME in the last 168 hours. Cardiac Enzymes: No results for input(s): CKTOTAL, CKMB, CKMBINDEX, TROPONINI in the last 168 hours. BNP (last 3 results) No results for input(s): PROBNP in the last 8760 hours. HbA1C: No results for input(s): HGBA1C in the last 72 hours. CBG: No results for input(s): GLUCAP in the last 168 hours. Lipid Profile: No results for input(s): CHOL, HDL, LDLCALC, TRIG, CHOLHDL, LDLDIRECT in the last 72 hours. Thyroid Function Tests: No results for input(s): TSH, T4TOTAL, FREET4,  T3FREE, THYROIDAB in the last 72 hours. Anemia Panel: No results for input(s): VITAMINB12, FOLATE, FERRITIN, TIBC, IRON, RETICCTPCT in the last 72 hours. Sepsis Labs: No results for input(s): PROCALCITON, LATICACIDVEN in the last 168 hours.  Recent Results (from the past 240 hour(s))  Wound or Superficial Culture     Status: None (Preliminary result)   Collection Time: 02/02/20  1:42 PM   Specimen: Wound  Result Value Ref Range Status   Specimen Description WOUND RIGHT ARM  Final   Special Requests NONE  Final   Gram Stain   Final    NO WBC SEEN ABUNDANT GRAM POSITIVE COCCI ABUNDANT GRAM NEGATIVE RODS ABUNDANT GRAM POSITIVE RODS Performed at [facility], [place] [address] [place],  [address]    Culture PENDING  Incomplete   Report Status PENDING  Incomplete  SARS  Coronavirus 2 by RT PCR (hospital order, performed in [facility] hospital lab) Nasopharyngeal Nasopharyngeal Swab     Status: None   Collection Time: 02/02/20  2:01 PM   Specimen: Nasopharyngeal Swab  Result Value Ref Range Status   SARS Coronavirus 2 NEGATIVE NEGATIVE Final    Comment: (NOTE) SARS-CoV-2 target nucleic acids are NOT DETECTED.  The SARS-CoV-2 RNA is generally detectable in upper and lower respiratory specimens during the acute phase of infection. The lowest concentration of SARS-CoV-2 viral copies this assay can detect is 250 copies / mL. A negative result does not preclude SARS-CoV-2 infection and should not be used as the sole basis for treatment or other patient management decisions.  A negative result may occur with improper specimen collection / handling, submission of specimen other than nasopharyngeal swab, presence of viral mutation(s) within the areas targeted by this assay, and inadequate number of viral copies (<250 copies / mL). A negative result must be combined with clinical observations, patient history, and epidemiological information.  Fact Sheet for Patients:    StrictlyIdeas.no  Fact Sheet for Healthcare Providers: BankingDealers.co.za  This test is not yet approved or  cleared by the [place] FDA and has been authorized for detection and/or diagnosis of SARS-CoV-2 by FDA under an Emergency Use Authorization (EUA).  This EUA will remain in effect (meaning this test can be used) for the duration of the COVID-19 declaration under Section 564(b)(1) of the Act, 21 U.S.C. section 360bbb-3(b)(1), unless the authorization is terminated or revoked sooner.  Performed at [facility], [place] [address], [place], [place] [address]   Blood culture (routine x 2)     Status: None (Preliminary result)   Collection Time: 02/02/20  2:10 PM   Specimen: BLOOD LEFT HAND  Result Value Ref Range Status   Specimen Description BLOOD LEFT HAND  Final   Special Requests   Final    BOTTLES DRAWN AEROBIC AND ANAEROBIC Blood Culture adequate volume   Culture   Final    NO GROWTH < 24 HOURS Performed at [facility], [place] [address], [place],  [address]    Report Status PENDING  Incomplete         Radiology Studies: DG Chest 1 View  Result Date: 02/02/2020 CLINICAL DATA:  49 year old male status post left IJ dialysis catheter placement. EXAM: CHEST  1 VIEW COMPARISON:  Chest radiograph dated 01/29/2018. FINDINGS: Left IJ central venous line with tip over central SVC close to the cavoatrial junction. No pneumothorax. There is cardiomegaly with vascular congestion. Minimal bibasilar atelectasis. Pneumonia is not excluded. Small bilateral pleural effusions suspected. No acute osseous pathology. IMPRESSION: 1. Left IJ central venous line with tip over central SVC. No pneumothorax. 2. Cardiomegaly with vascular congestion and small bilateral pleural effusions. Electronically Signed   By: Anner Crete M.D.   On: 02/02/2020 18:31   DG Forearm Right  Result Date: 02/02/2020 CLINICAL DATA:   Cellulitis EXAM: RIGHT FOREARM - 2 VIEW COMPARISON:  None. FINDINGS: No acute displaced fracture or malalignment. Smooth calcifications posterior to the distal humerus, possible loose bodies or nonspecific soft tissue calcification. No significant elbow effusion. No periostitis or bone destruction. Clips along the volar wrist. Extensive subcutaneous edema without emphysema. IMPRESSION: 1. No acute osseous abnormality. 2. Extensive subcutaneous edema. Electronically Signed   By: Donavan Foil M.D.   On: 02/02/2020 15:52   DG Fluoro Guide CV Line-No Report  Result Date: 02/02/2020 Fluoroscopy was utilized by the requesting physician.  No radiographic interpretation.        Scheduled Meds: . Chlorhexidine Gluconate Cloth  6 each Topical Q0600  . darbepoetin (ARANESP) injection - DIALYSIS  40 mcg Intravenous Q Mon-HD  . [START ON 02/04/2020] doxercalciferol  1 mcg Intravenous Q M,W,F-HD  . losartan  25 mg Oral Once  . sevelamer carbonate  800 mg Oral TID WC   Continuous Infusions: . sodium chloride    . doxycycline (VIBRAMYCIN) IV 100 mg (02/03/20 0628)     LOS: 1 day    Time spent: 35 mins,More than 50% of that time was spent in counseling and/or coordination of care.      Shelly Coss, MD [facility] P7/20/2021, 8:25 AM

## 2020-02-03 NOTE — Anesthesia Postprocedure Evaluation (Signed)
Anesthesia Post Note  Patient: Jerry [name]  Procedure(s) Performed: INSERTION OF LEFT INTERNAL JUGULAR 28 CM TUNNELED DIALYSIS CATHETER UNDER ULTRASOUND GUIDANCE (Left )     Patient location during evaluation: PACU Anesthesia Type: MAC Level of consciousness: awake and alert Pain management: pain level controlled Vital Signs Assessment: post-procedure vital signs reviewed and stable Respiratory status: spontaneous breathing and respiratory function stable Cardiovascular status: stable Postop Assessment: no apparent nausea or vomiting Anesthetic complications: no   No complications documented.                Kylia Grajales DANIEL

## 2020-02-04 DIAGNOSIS — L03113 Cellulitis of right upper limb: Secondary | ICD-10-CM

## 2020-02-04 DIAGNOSIS — I10 Essential (primary) hypertension: Secondary | ICD-10-CM

## 2020-02-04 DIAGNOSIS — N186 End stage renal disease: Secondary | ICD-10-CM

## 2020-02-04 DIAGNOSIS — D689 Coagulation defect, unspecified: Secondary | ICD-10-CM | POA: Insufficient documentation

## 2020-02-04 LAB — BLOOD CULTURE ID PANEL (REFLEXED)

## 2020-02-04 LAB — RENAL FUNCTION PANEL
Albumin: 3.1 g/dL — ABNORMAL LOW (ref 3.5–5.0)
Anion gap: 19 — ABNORMAL HIGH (ref 5–15)
BUN: 70 mg/dL — ABNORMAL HIGH (ref 6–20)
CO2: 22 mmol/L (ref 22–32)
Calcium: 7.4 mg/dL — ABNORMAL LOW (ref 8.9–10.3)
Chloride: 97 mmol/L — ABNORMAL LOW (ref 98–111)
Creatinine, Ser: 11.76 mg/dL — ABNORMAL HIGH (ref 0.61–1.24)
GFR calc Af Amer: 5 mL/min — ABNORMAL LOW (ref >60)
GFR calc non Af Amer: 4 mL/min — ABNORMAL LOW (ref >60)
Glucose, Bld: 97 mg/dL (ref 70–99)
Phosphorus: 5.5 mg/dL — ABNORMAL HIGH (ref 2.5–4.6)
Potassium: 5.5 mmol/L — ABNORMAL HIGH (ref 3.5–5.1)
Sodium: 138 mmol/L (ref 135–145)

## 2020-02-04 LAB — ANTISTREPTOLYSIN O TITER: ASO: 96 IU/mL (ref 0.0–200.0)

## 2020-02-04 MED ORDER — DOXYCYCLINE HYCLATE 100 MG PO TABS: 100.0000 mg | ORAL_TABLET | Freq: Two times a day (BID) | ORAL | 0 refills | Status: AC

## 2020-02-04 MED ORDER — OXYCODONE-ACETAMINOPHEN 5-325 MG PO TABS
1.0000 | ORAL_TABLET | Freq: Four times a day (QID) | ORAL | Status: DC | PRN
  Administered 2020-02-04: 1 via ORAL
  Filled 2020-02-04: qty 1

## 2020-02-04 MED ORDER — SODIUM ZIRCONIUM CYCLOSILICATE 10 G PO PACK
10.0000 g | PACK | Freq: Once | ORAL | Status: DC
  Filled 2020-02-04: qty 1

## 2020-02-04 MED ORDER — DOXYCYCLINE HYCLATE 100 MG PO TABS: 100.0000 mg | ORAL_TABLET | Freq: Two times a day (BID) | ORAL | Status: DC

## 2020-02-04 MED ORDER — OXYCODONE-ACETAMINOPHEN 5-325 MG PO TABS: 1.0000 | ORAL_TABLET | Freq: Three times a day (TID) | ORAL | 0 refills | Status: AC | PRN

## 2020-02-04 NOTE — Discharge Summary (Signed)
Discharge Summary  Jerry [name] IWL:[id number] DOB: 05-16-1971  PCP: System, Pcp Not In  Admit date: 02/02/2020 Discharge date: 02/04/2020  Time spent: 40 mins  Recommendations for Outpatient Follow-up:  1. Follow-up with PCP in 1 week 2. Follow-up with vascular surgeon in 1 week 3. Follow-up with nephrology as scheduled  Discharge Diagnoses:  Active Hospital Problems   Diagnosis Date Noted   Cellulitis 02/02/2020   Right arm cellulitis 02/02/2020    Resolved Hospital Problems  No resolved problems to display.    Discharge Condition: Stable  Diet recommendation: Renal diet  Vitals:   02/04/20 0556 02/04/20 0831  BP: (!) 144/97 (!) 146/97  Pulse: 92 72  Resp: 15 16  Temp: 97.8 F (36.6 C) 98.1 F (36.7 C)  SpO2: 100% 99%    History of present illness:  Patient is a 49 year old male with history of end-stage renal disease on dialysis via right arm AV fistula, hypertension, OSA who presented with right arm fistula site swelling and pain for 2 days.  Recently he had a revision of his right arm AV fistula on 12/26/2019 for acute bleeding.  He was also complaining of drainage from the AV fistula site and also low-grade fever at home.  On presentation, he had mild leukocytosis.  Vascular surgery and nephrology consulted.    Today, patient denied any new complaints, just pain around his right arm fistula.  Denies any chest pain, shortness of breath, abdominal pain, nausea/vomiting, fever/chills.  Discussed with nephrology, patient stable to be discharged and to have dialysis on 02/05/2020 in the AM.  Patient advised to be compliant with p.o. antibiotics, follow-up with PCP in 1 week as well as vascular surgeon.  Patient verbalized understanding.   Hospital Course:  Active Problems:   Cellulitis   Right arm cellulitis  Right arm AV fistula site cellulitis Patient has a right radiocephalic AV fistula that was placed about 10 years ago, recently had a revision of this  right arm AV fistula Currently afebrile, with resolved leukocytosis BC x2, grew gram-positive cocci in clusters in 1 aerobic bottle only, as per pharmacy, likely contaminant as BCID did not identify anything  Superficial wound culture grew MSSA, pansensitive Vascular surgery consulted, recommend to follow-up in 1 week for wound checkup and possible removal of sutures Discharge patient on doxycycline for total of 10 days Follow-up with PCP, vascular surgery  ESRD on dialysis Continue using tunnel catheter placement for dialysis access in order to rest the fistula Outpatient dialysis  Hyperkalemia Continue outpatient dialysis  Uncontrolled hypertension Continue home regimen Follow-up with PCP, nephrology  Anemia of CKD Hemoglobin at baseline  Metabolic bone disease Continue binders  OSA Continue CPAP at bedtime  Chronic thrombocytosis Follow-up with PCP  Obesity Lifestyle modification advised        Malnutrition Type:      Malnutrition Characteristics:      Nutrition Interventions:      Estimated body mass index is 38.68 kg/m as calculated from the following:   Height as of this encounter: 5\' 8"  (1.727 m).   Weight as of this encounter: 115.4 kg.    Procedures:  Tunneled catheter placement by vascular surgery  Consultations:  Vascular surgery  Nephrology    Discharge Exam: BP (!) 146/97 (BP Location: Left Arm)    Pulse 72    Temp 98.1 F (36.7 C) (Oral)    Resp 16    Ht 5\' 8"  (1.727 m)    Wt 115.4 kg    SpO2 99%  BMI 38.68 kg/m   General: NAD Cardiovascular: S1, S2 present Respiratory: CTA B Extremity: Right arm dressing C/D/I    Discharge Instructions You were cared for by a hospitalist during your hospital stay. If you have any questions about your discharge medications or the care you received while you were in the hospital after you are discharged, you can call the unit and asked to speak with the hospitalist on call if the  hospitalist that took care of you is not available. Once you are discharged, your primary care physician will handle any further medical issues. Please note that NO REFILLS for any discharge medications will be authorized once you are discharged, as it is imperative that you return to your primary care physician (or establish a relationship with a primary care physician if you do not have one) for your aftercare needs so that they can reassess your need for medications and monitor your lab values.  Discharge Instructions    Diet - low sodium heart healthy   Complete by: As directed    Discharge wound care:   Complete by: As directed    Follow up with Vascular surgery in 1 week for wound check. Cleanse right forearm wound with NS.  Apply Xeroform gauze and cover with dry dressing and kerlix/tape.  Change daily.   Increase activity slowly   Complete by: As directed      Allergies as of 02/04/2020   No Known Allergies     Medication List    TAKE these medications   acetaminophen 500 MG tablet Commonly known as: TYLENOL Take 1,000 mg by mouth every 6 (six) hours as needed for mild pain.   calcium acetate 667 MG capsule Commonly known as: PHOSLO Take 2,001 mg by mouth with breakfast, with lunch, and with evening meal. And 1 capsule with a snack   carvedilol 25 MG tablet Commonly known as: COREG Take 1 tablet (25 mg total) by mouth 2 (two) times daily with a meal.   doxycycline 100 MG tablet Commonly known as: VIBRA-TABS Take 1 tablet (100 mg total) by mouth every 12 (twelve) hours for 10 days.   losartan 25 MG tablet Commonly known as: COZAAR Take 25 mg by mouth at bedtime.   multivitamin Tabs tablet Take 1 tablet by mouth daily.   oxyCODONE-acetaminophen 5-325 MG tablet Commonly known as: PERCOCET/ROXICET Take 1 tablet by mouth every 8 (eight) hours as needed for up to 3 days. What changed: when to take this            Discharge Care Instructions  (From admission,  onward)         Start     Ordered   02/04/20 0000  Discharge wound care:       Comments: Follow up with Vascular surgery in 1 week for wound check. Cleanse right forearm wound with NS.  Apply Xeroform gauze and cover with dry dressing and kerlix/tape.  Change daily.   02/04/20 1235         No Known Allergies  Follow-up Information    Marty Heck, MD. Schedule an appointment as soon as possible for a visit in 1 week(s).   Specialty: Vascular Surgery Contact information: [address] [place]  [address] [phone]                The results of significant diagnostics from this hospitalization (including imaging, microbiology, ancillary and laboratory) are listed below for reference.    Significant Diagnostic Studies: DG Chest 1 View  Result Date: 02/02/2020 CLINICAL DATA:  49 year old male status post left IJ dialysis catheter placement. EXAM: CHEST  1 VIEW COMPARISON:  Chest radiograph dated 01/29/2018. FINDINGS: Left IJ central venous line with tip over central SVC close to the cavoatrial junction. No pneumothorax. There is cardiomegaly with vascular congestion. Minimal bibasilar atelectasis. Pneumonia is not excluded. Small bilateral pleural effusions suspected. No acute osseous pathology. IMPRESSION: 1. Left IJ central venous line with tip over central SVC. No pneumothorax. 2. Cardiomegaly with vascular congestion and small bilateral pleural effusions. Electronically Signed   By: Anner Crete M.D.   On: 02/02/2020 18:31   DG Forearm Right  Result Date: 02/02/2020 CLINICAL DATA:  Cellulitis EXAM: RIGHT FOREARM - 2 VIEW COMPARISON:  None. FINDINGS: No acute displaced fracture or malalignment. Smooth calcifications posterior to the distal humerus, possible loose bodies or nonspecific soft tissue calcification. No significant elbow effusion. No periostitis or bone destruction. Clips along the volar wrist. Extensive subcutaneous edema without emphysema. IMPRESSION:  1. No acute osseous abnormality. 2. Extensive subcutaneous edema. Electronically Signed   By: Donavan Foil M.D.   On: 02/02/2020 15:52   DG Fluoro Guide CV Line-No Report  Result Date: 02/02/2020 Fluoroscopy was utilized by the requesting physician.  No radiographic interpretation.    Microbiology: Recent Results (from the past 240 hour(s))  Wound or Superficial Culture     Status: None (Preliminary result)   Collection Time: 02/02/20  1:42 PM   Specimen: Wound  Result Value Ref Range Status   Specimen Description WOUND RIGHT ARM  Final   Special Requests NONE  Final   Gram Stain   Final    NO WBC SEEN ABUNDANT GRAM POSITIVE COCCI ABUNDANT GRAM NEGATIVE RODS ABUNDANT GRAM POSITIVE RODS    Culture   Final    FEW STAPHYLOCOCCUS AUREUS CULTURE REINCUBATED FOR BETTER GROWTH Performed at [facility], [place] [address], [place], [place] [address]    Report Status PENDING  Incomplete   Organism ID, Bacteria STAPHYLOCOCCUS AUREUS  Final      Susceptibility   Staphylococcus aureus - MIC*    CIPROFLOXACIN <=0.5 SENSITIVE Sensitive     ERYTHROMYCIN <=0.25 SENSITIVE Sensitive     GENTAMICIN <=0.5 SENSITIVE Sensitive     OXACILLIN <=0.25 SENSITIVE Sensitive     TETRACYCLINE <=1 SENSITIVE Sensitive     VANCOMYCIN <=0.5 SENSITIVE Sensitive     TRIMETH/SULFA <=10 SENSITIVE Sensitive     CLINDAMYCIN <=0.25 SENSITIVE Sensitive     RIFAMPIN <=0.5 SENSITIVE Sensitive     Inducible Clindamycin NEGATIVE Sensitive     * FEW STAPHYLOCOCCUS AUREUS  SARS Coronavirus 2 by RT PCR (hospital order, performed in [facility] hospital lab) Nasopharyngeal Nasopharyngeal Swab     Status: None   Collection Time: 02/02/20  2:01 PM   Specimen: Nasopharyngeal Swab  Result Value Ref Range Status   SARS Coronavirus 2 NEGATIVE NEGATIVE Final    Comment: (NOTE) SARS-CoV-2 target nucleic acids are NOT DETECTED.  The SARS-CoV-2 RNA is generally detectable in upper and lower respiratory specimens during the  acute phase of infection. The lowest concentration of SARS-CoV-2 viral copies this assay can detect is 250 copies / mL. A negative result does not preclude SARS-CoV-2 infection and should not be used as the sole basis for treatment or other patient management decisions.  A negative result may occur with improper specimen collection / handling, submission of specimen other than nasopharyngeal swab, presence of viral mutation(s) within the areas targeted by this assay, and inadequate  number of viral copies (<250 copies / mL). A negative result must be combined with clinical observations, patient history, and epidemiological information.  Fact Sheet for Patients:   StrictlyIdeas.no  Fact Sheet for Healthcare Providers: BankingDealers.co.za  This test is not yet approved or  cleared by the [place] FDA and has been authorized for detection and/or diagnosis of SARS-CoV-2 by FDA under an Emergency Use Authorization (EUA).  This EUA will remain in effect (meaning this test can be used) for the duration of the COVID-19 declaration under Section 564(b)(1) of the Act, 21 U.S.C. section 360bbb-3(b)(1), unless the authorization is terminated or revoked sooner.  Performed at [facility], [place] [address], [place], [place] [address]   Blood culture (routine x 2)     Status: None (Preliminary result)   Collection Time: 02/02/20  2:10 PM   Specimen: BLOOD LEFT HAND  Result Value Ref Range Status   Specimen Description BLOOD LEFT HAND  Final   Special Requests   Final    BOTTLES DRAWN AEROBIC AND ANAEROBIC Blood Culture adequate volume   Culture   Final    NO GROWTH 2 DAYS Performed at [facility], [place] [address], [place], [place] [address]    Report Status PENDING  Incomplete  Culture, blood (Routine X 2) w Reflex to ID Panel     Status: None (Preliminary result)   Collection Time: 02/03/20  4:15 AM   Specimen: BLOOD  Result  Value Ref Range Status   Specimen Description BLOOD LEFT ANTECUBITAL  Final   Special Requests   Final    BOTTLES DRAWN AEROBIC AND ANAEROBIC Blood Culture adequate volume   Culture  Setup Time   Final    GRAM POSITIVE COCCI IN CLUSTERS BOTTLES DRAWN AEROBIC ONLY CRITICAL RESULT CALLED TO, READ BACK BY AND VERIFIED WITH: Sarajane Marek [id number] AT 0935 BY SKEMPHER Performed at [facility], [place] [address], [place], [place] [address]    Culture GRAM POSITIVE COCCI  Final   Report Status PENDING  Incomplete  Blood Culture ID Panel (Reflexed)     Status: None   Collection Time: 02/03/20  4:15 AM  Result Value Ref Range Status   Enterococcus species NOT DETECTED NOT DETECTED Final   Listeria monocytogenes NOT DETECTED NOT DETECTED Final   Staphylococcus species NOT DETECTED NOT DETECTED Final   Staphylococcus aureus (BCID) NOT DETECTED NOT DETECTED Final   Streptococcus species NOT DETECTED NOT DETECTED Final   Streptococcus agalactiae NOT DETECTED NOT DETECTED Final   Streptococcus pneumoniae NOT DETECTED NOT DETECTED Final   Streptococcus pyogenes NOT DETECTED NOT DETECTED Final   Acinetobacter baumannii NOT DETECTED NOT DETECTED Final   Enterobacteriaceae species NOT DETECTED NOT DETECTED Final   Enterobacter cloacae complex NOT DETECTED NOT DETECTED Final   Escherichia coli NOT DETECTED NOT DETECTED Final   Klebsiella oxytoca NOT DETECTED NOT DETECTED Final   Klebsiella pneumoniae NOT DETECTED NOT DETECTED Final   Proteus species NOT DETECTED NOT DETECTED Final   Serratia marcescens NOT DETECTED NOT DETECTED Final   Haemophilus influenzae NOT DETECTED NOT DETECTED Final   Neisseria meningitidis NOT DETECTED NOT DETECTED Final   Pseudomonas aeruginosa NOT DETECTED NOT DETECTED Final   Candida albicans NOT DETECTED NOT DETECTED Final   Candida glabrata NOT DETECTED NOT DETECTED Final   Candida krusei NOT DETECTED NOT DETECTED Final   Candida parapsilosis NOT DETECTED NOT  DETECTED Final   Candida tropicalis NOT DETECTED NOT DETECTED Final  Comment: Performed at [facility], [place] [address] [place], [place] [address]     Labs: Basic Metabolic Panel: Recent Labs  Lab 02/02/20 0834 02/03/20 0415 02/04/20 1033  NA 139 136 138  K 5.8* 5.1 5.5*  CL 98 97* 97*  CO2 23 25 22   GLUCOSE 91 126* 97  BUN 67* 33* 70*  CREATININE 13.88* 8.04* 11.76*  CALCIUM 7.7* 7.8* 7.4*  PHOS  --   --  5.5*   Liver Function Tests: Recent Labs  Lab 02/02/20 0834 02/04/20 1033  AST 35  --   ALT 12  --   ALKPHOS 70  --   BILITOT 1.0  --   PROT 8.0  --   ALBUMIN 3.2* 3.1*   No results for input(s): LIPASE, AMYLASE in the last 168 hours. No results for input(s): AMMONIA in the last 168 hours. CBC: Recent Labs  Lab 02/02/20 0834 02/03/20 0415  WBC 11.7* 9.9  NEUTROABS 8.1*  --   HGB 9.6* 9.8*  HCT 33.7* 32.9*  MCV 96.3 94.3  PLT 534* 484*   Cardiac Enzymes: No results for input(s): CKTOTAL, CKMB, CKMBINDEX, TROPONINI in the last 168 hours. BNP: BNP (last 3 results) No results for input(s): BNP in the last 8760 hours.  ProBNP (last 3 results) No results for input(s): PROBNP in the last 8760 hours.  CBG: No results for input(s): GLUCAP in the last 168 hours.     Signed:  Alma Friendly, MD [facility] 02/04/2020, 12:52 PM

## 2020-02-04 NOTE — Progress Notes (Addendum)
  [facility] Progress Note   Subjective:  Seen in room. No new complaints this am.  Pain controlled. Comfortable. Denies cp, sob.   Objective Vitals:   02/03/20 1708 02/03/20 2316 02/04/20 0556 02/04/20 0831  BP: (!) 157/110 (!) 138/91 (!) 144/97 (!) 146/97  Pulse: 88 100 92 72  Resp: 11 10 15 16   Temp: 98.4 F (36.9 C) 98 F (36.7 C) 97.8 F (36.6 C) 98.1 F (36.7 C)  TempSrc: Oral Oral Oral Oral  SpO2: 97% 96% 100% 99%  Weight:      Height:        Physical Exam General: Well appearing, nad  Heart: RRR Lungs: Clear bilaterally  Abdomen: soft non-tender  Extremities: no LE edema  Dialysis Access: R AVF, bandaged; new L IJ TDC dsg c,d,i   Additional Objective Labs: Basic Metabolic Panel: Recent Labs  Lab 02/02/20 0834 02/03/20 0415  NA 139 136  K 5.8* 5.1  CL 98 97*  CO2 23 25  GLUCOSE 91 126*  BUN 67* 33*  CREATININE 13.88* 8.04*  CALCIUM 7.7* 7.8*   CBC: Recent Labs  Lab 02/02/20 0834 02/03/20 0415  WBC 11.7* 9.9  NEUTROABS 8.1*  --   HGB 9.6* 9.8*  HCT 33.7* 32.9*  MCV 96.3 94.3  PLT 534* 484*   Blood Culture    Component Value Date/Time   SDES BLOOD LEFT ANTECUBITAL 02/03/2020 0415   SPECREQUEST  02/03/2020 0415    BOTTLES DRAWN AEROBIC AND ANAEROBIC Blood Culture adequate volume   CULT GRAM POSITIVE COCCI 02/03/2020 0415   REPTSTATUS PENDING 02/03/2020 0415     Medications: . sodium chloride     . Chlorhexidine Gluconate Cloth  6 each Topical Q0600  . darbepoetin (ARANESP) injection - DIALYSIS  40 mcg Intravenous Q Wed-HD  . doxercalciferol  1 mcg Intravenous Q M,W,F-HD  . doxycycline  100 mg Oral Q12H  . sevelamer carbonate  800 mg Oral TID WC    Dialysis Orders:  AF  MWF 4 hrs 180NRe 425/800 111 kg 2.0 K/ 2.25 Ca UFP 2  R AVF Heparin 3000  -Hectorol 1 mcg IV TIW -Venofer 100 mg IV X 10 doses (3/10 doses given).  -Mircera 75 mcg IV q 4 weeks (last dose 01/12/2020)  Assessment/Plan: 1. Erythema/swelling R AVF -  Initially concern for infection. IV antibiotics started  >PO doxy.  Blood culture ngtd. Wound culture + staph. Had L IJ TDC placed per VVS 7/19. Plan is to rest fistula and f/i [facility] 1 week.  2.  ESRD -  HD MWF. HD today on schedule.  3. Hypertension/volume  - Hypertensive. On losartan 25 qhs.  Not to yet to dry weight. UF as tolerated to OP EDW.  4. Anemia  -Holding IV Fe 2/2 possible infection. Follow HGB. ESA actually due next week but will redose with HD as he is unable to get Fe load.  5. Metabolic bone disease -  On calcium binders, continue VDRA. S/P parathyroidectomy. 6. Nutrition Renal diet with protein supp.   Lynnda Child PA-C [facility] 02/04/2020,10:21 AM

## 2020-02-04 NOTE — Discharge Instructions (Signed)

## 2020-02-04 NOTE — Progress Notes (Signed)
Renal Navigator discussed patient's discharge plan with team and patient. He finished HD early this morning and [facility] clinic[facility] can accommodate him off schedule tomorrow at 6:00am. He needs to arrive at 5:45am. He will then attend his regularly scheduled HD on Friday at his OP HD clinic. Patient and team are in agreement of above plan. Patient asked Navigator about therapy resources, as he would like to start seeing a counselor again to maintain good Mental Health. He states he really enjoyed therapy when he was living in [place] and sees the benefit of having a [facility] professional to speak with/process feelings with. He spoke at length about his kidney disease and how it has changed his life. He spoke about how he has not allowed it to depress him, but rather to motivate him. Navigator provided resources, encouragement, supportive brief therapy, and thanked patient for sharing a part of his story today. Patient will not have inpt HD today prior to discharge. His girlfriend will pick him up at discharge and patient will drive himself to HD tomorrow and Friday.  Alphonzo Cruise, [facility] Renal Navigator [phone]

## 2020-02-04 NOTE — Progress Notes (Signed)
Pt discharged from unit. Medication/discharge instruction given, [facility]  Marlicia Sroka K Angelito Hopping, RN   

## 2020-02-04 NOTE — Progress Notes (Signed)
[facility]  Subjective  - no complaints.  LIJ tunneled catheter working.   Objective (!) 144/97 92 97.8 F (36.6 C) (Oral) 15 100%  Intake/Output Summary (Last 24 hours) at 02/04/2020 0823 Last data filed at 02/04/2020 7017 Gross per 24 hour  Intake 1220 ml  Output 0 ml  Net 1220 ml    LIJ tunneled catheter c/d/i Right arm AVF with good thrill, sutures intact No overt cellulitis  Laboratory Lab Results: Recent Labs    02/02/20 0834 02/03/20 0415  WBC 11.7* 9.9  HGB 9.6* 9.8*  HCT 33.7* 32.9*  PLT 534* 484*   BMET Recent Labs    02/02/20 0834 02/03/20 0415  NA 139 136  K 5.8* 5.1  CL 98 97*  CO2 23 25  GLUCOSE 91 126*  BUN 67* 33*  CREATININE 13.88* 8.04*  CALCIUM 7.7* 7.8*    COAG Lab Results  Component Value Date   INR 1.0 12/11/2019   No results found for: PTT  Assessment/Planning:  Now postop day 2 status post placement of left IJ tunneled dialysis catheter to rest his right forearm fistula revision site given initial concern for infection.  Catheter continues to work well.  His right forearm is looking better.  I would be okay with transition to oral antibiotics for at least 10 days and discharge.  We will leave the sutures for now.  Will arrange follow-up in 1 week in the clinic for a wound check and can decide about suture removal at that time.  Hoping to salvage the fistula by resting this with catheter in place.  Good thrill in the fistula still.  Marty Heck 02/04/2020 8:23 AM --

## 2020-02-04 NOTE — Progress Notes (Signed)
PHARMACY - PHYSICIAN COMMUNICATION CRITICAL VALUE ALERT - BLOOD CULTURE IDENTIFICATION (BCID)  Jerry [name] is an 49 y.o. male who presented to [facility] on 02/02/2020 with a chief complaint of AV fistula drainage  Assessment:  GPC found in one blood culture, BCID negative.  Suspect contamination  Name of physician (or Provider) Contacted: Dr. Horris Latino paged  Current antibiotics: doxycycline  Changes to prescribed antibiotics recommended: None   Results for orders placed or performed during the hospital encounter of 02/02/20  Blood Culture ID Panel (Reflexed) (Collected: 02/03/2020  4:15 AM)  Result Value Ref Range   Enterococcus species NOT DETECTED NOT DETECTED   Listeria monocytogenes NOT DETECTED NOT DETECTED   Staphylococcus species NOT DETECTED NOT DETECTED   Staphylococcus aureus (BCID) NOT DETECTED NOT DETECTED   Streptococcus species NOT DETECTED NOT DETECTED   Streptococcus agalactiae NOT DETECTED NOT DETECTED   Streptococcus pneumoniae NOT DETECTED NOT DETECTED   Streptococcus pyogenes NOT DETECTED NOT DETECTED   Acinetobacter baumannii NOT DETECTED NOT DETECTED   Enterobacteriaceae species NOT DETECTED NOT DETECTED   Enterobacter cloacae complex NOT DETECTED NOT DETECTED   Escherichia coli NOT DETECTED NOT DETECTED   Klebsiella oxytoca NOT DETECTED NOT DETECTED   Klebsiella pneumoniae NOT DETECTED NOT DETECTED   Proteus species NOT DETECTED NOT DETECTED   Serratia marcescens NOT DETECTED NOT DETECTED   Haemophilus influenzae NOT DETECTED NOT DETECTED   Neisseria meningitidis NOT DETECTED NOT DETECTED   Pseudomonas aeruginosa NOT DETECTED NOT DETECTED   Candida albicans NOT DETECTED NOT DETECTED   Candida glabrata NOT DETECTED NOT DETECTED   Candida krusei NOT DETECTED NOT DETECTED   Candida parapsilosis NOT DETECTED NOT DETECTED   Candida tropicalis NOT DETECTED NOT DETECTED    Candie Mile 02/04/2020  1:47 PM

## 2020-02-05 ENCOUNTER — Telehealth: Payer: Self-pay | Admitting: Nephrology

## 2020-02-05 DIAGNOSIS — A4901 Methicillin susceptible Staphylococcus aureus infection, unspecified site: Secondary | ICD-10-CM | POA: Insufficient documentation

## 2020-02-05 LAB — CULTURE, BLOOD (ROUTINE X 2): Special Requests: ADEQUATE

## 2020-02-05 LAB — AEROBIC CULTURE W GRAM STAIN (SUPERFICIAL SPECIMEN): Gram Stain: NONE SEEN

## 2020-02-05 NOTE — Telephone Encounter (Signed)
Transition of care contact from inpatient facility  Date of Discharge: 02/04/20 Date of Contact: 02/04/21 Method of contact: Phone  Attempted to contact patient to discuss transition of care from inpatient admission. Patient did not answer the phone. Message was left on the patient's voicemail with call back number [phone] or his dialysis unit.

## 2020-02-07 LAB — CULTURE, BLOOD (ROUTINE X 2)
Culture: NO GROWTH
Special Requests: ADEQUATE

## 2020-02-09 ENCOUNTER — Telehealth: Payer: Self-pay | Admitting: Nephrology

## 2020-02-09 NOTE — Telephone Encounter (Signed)
Transition of Care Contact from [place]  Date of Discharge: 02/04/20 Date of Contact: 02/09/20 Method of contact: phone  Attempted to contact patient to discuss transition of care from inpatient admission. Patient did not answer the phone.  Message was left on patient's voicemail informing them we would attempt to call them again and if unable to reach will follow up at dialysis.  Jen Mow, PA-C [facility] Pager: [phone]

## 2020-02-10 ENCOUNTER — Ambulatory Visit (INDEPENDENT_AMBULATORY_CARE_PROVIDER_SITE_OTHER): Payer: Self-pay | Admitting: Physician Assistant

## 2020-02-10 ENCOUNTER — Other Ambulatory Visit: Payer: Self-pay

## 2020-02-10 VITALS — BP 128/91 | HR 111 | Temp 97.9°F | Resp 20 | Ht 68.0 in | Wt 245.4 lb

## 2020-02-10 DIAGNOSIS — N186 End stage renal disease: Secondary | ICD-10-CM

## 2020-02-10 DIAGNOSIS — Z992 Dependence on renal dialysis: Secondary | ICD-10-CM

## 2020-02-10 NOTE — Progress Notes (Signed)
  POST OPERATIVE OFFICE NOTE    CC:  F/u for surgery  HPI:  This is a 49 y.o. male who is s/p repair of right forearm fistula with abscess by Dr. Donnetta Hutching followed by [facility] placement by Dr. Carlis Abbott.    Pt returns today for follow up and suture removal of the right forearm incision.  He has had some left neck and facial swelling since the [facility] placement on 02/02/2020.  No SOB or voice disturbance noted.    He denise fever and chills.      No Known Allergies  Current Outpatient Medications  Medication Sig Dispense Refill  . acetaminophen (TYLENOL) 500 MG tablet Take 1,000 mg by mouth every 6 (six) hours as needed for mild pain.    . calcium acetate (PHOSLO) 667 MG capsule Take 2,001 mg by mouth with breakfast, with lunch, and with evening meal. And 1 capsule with a snack    . doxycycline (VIBRA-TABS) 100 MG tablet Take 1 tablet (100 mg total) by mouth every 12 (twelve) hours for 10 days. 20 tablet 0  . losartan (COZAAR) 25 MG tablet Take 25 mg by mouth at bedtime.    . multivitamin (RENA-VIT) TABS tablet Take 1 tablet by mouth daily.    . carvedilol (COREG) 25 MG tablet Take 1 tablet (25 mg total) by mouth 2 (two) times daily with a meal. 60 tablet 0   No current facility-administered medications for this visit.     ROS:  See HPI  Physical Exam:    Incision:  Well healed with skin thickening surround incision.  Sutured removed patient tolerated this well. Extremities:  Grip 5/5, sensation intact and palpable thrill above and below the new incision.     Assessment/Plan:  This is a 49 y.o. male who is s/p: repair of right forearm fistula with abscess by Dr. Donnetta Hutching followed by [facility] placement by Dr. Carlis Abbott.    The fistula may be accessed and once it is functional he can be scheduled for [facility] removal at [place].  F/U PRN.    Roxy Horseman PA-C [facility] [phone]  Clinic MD:  Carlis Abbott

## 2020-02-18 ENCOUNTER — Other Ambulatory Visit: Payer: Self-pay

## 2020-02-18 ENCOUNTER — Other Ambulatory Visit (HOSPITAL_COMMUNITY)
Admission: RE | Admit: 2020-02-18 | Discharge: 2020-02-18 | Disposition: A | Discharge status: Home / Self Care | Payer: Medicare (Managed Care) | Source: Ambulatory Visit | Attending: Surgery | Admitting: Surgery

## 2020-02-18 DIAGNOSIS — Z01812 Encounter for preprocedural laboratory examination: Secondary | ICD-10-CM | POA: Insufficient documentation

## 2020-02-18 DIAGNOSIS — Z20822 Contact with and (suspected) exposure to covid-19: Secondary | ICD-10-CM | POA: Diagnosis not present

## 2020-02-18 LAB — SARS CORONAVIRUS 2 (TAT 6-24 HRS): SARS Coronavirus 2: NEGATIVE

## 2020-02-18 MED ORDER — SODIUM CHLORIDE 0.9 % IV SOLN: 250.0000 mL | INTRAVENOUS | Status: DC | PRN

## 2020-02-19 ENCOUNTER — Other Ambulatory Visit: Payer: Self-pay

## 2020-02-19 ENCOUNTER — Ambulatory Visit (HOSPITAL_COMMUNITY)
Admission: RE | Admit: 2020-02-19 | Discharge: 2020-02-19 | Disposition: A | Discharge status: Home / Self Care | Payer: Medicare (Managed Care) | Attending: Surgery | Admitting: Surgery

## 2020-02-19 ENCOUNTER — Encounter (HOSPITAL_COMMUNITY)
Admission: RE | Disposition: A | Discharge status: Home / Self Care | Payer: Self-pay | Source: Home / Self Care | Attending: Surgery

## 2020-02-19 DIAGNOSIS — Z992 Dependence on renal dialysis: Secondary | ICD-10-CM | POA: Insufficient documentation

## 2020-02-19 DIAGNOSIS — N186 End stage renal disease: Secondary | ICD-10-CM | POA: Diagnosis not present

## 2020-02-19 DIAGNOSIS — T82898A Other specified complication of vascular prosthetic devices, implants and grafts, initial encounter: Secondary | ICD-10-CM | POA: Diagnosis not present

## 2020-02-19 DIAGNOSIS — Y841 Kidney dialysis as the cause of abnormal reaction of the patient, or of later complication, without mention of misadventure at the time of the procedure: Secondary | ICD-10-CM | POA: Diagnosis not present

## 2020-02-19 DIAGNOSIS — T82858A Stenosis of vascular prosthetic devices, implants and grafts, initial encounter: Secondary | ICD-10-CM | POA: Diagnosis not present

## 2020-02-19 HISTORY — PX: PERIPHERAL VASCULAR INTERVENTION: CATH118257

## 2020-02-19 HISTORY — PX: A/V FISTULAGRAM: CATH118298

## 2020-02-19 LAB — POCT I-STAT, CHEM 8
BUN: 53 mg/dL — ABNORMAL HIGH (ref 6–20)
Calcium, Ion: 0.84 mmol/L — CL (ref 1.15–1.40)
Chloride: 97 mmol/L — ABNORMAL LOW (ref 98–111)
Creatinine, Ser: 10.6 mg/dL — ABNORMAL HIGH (ref 0.61–1.24)
Glucose, Bld: 89 mg/dL (ref 70–99)
HCT: 36 % — ABNORMAL LOW (ref 39.0–52.0)
Hemoglobin: 12.2 g/dL — ABNORMAL LOW (ref 13.0–17.0)
Potassium: 5.3 mmol/L — ABNORMAL HIGH (ref 3.5–5.1)
Sodium: 139 mmol/L (ref 135–145)
TCO2: 30 mmol/L (ref 22–32)

## 2020-02-19 SURGERY — A/V FISTULAGRAM
Anesthesia: LOCAL | Laterality: Right

## 2020-02-19 MED ORDER — LIDOCAINE HCL (PF) 1 % IJ SOLN
INTRAMUSCULAR | Status: AC
  Filled 2020-02-19: qty 30

## 2020-02-19 MED ORDER — MIDAZOLAM HCL 2 MG/2ML IJ SOLN
INTRAMUSCULAR | Status: AC
  Filled 2020-02-19: qty 2

## 2020-02-19 MED ORDER — FENTANYL CITRATE (PF) 100 MCG/2ML IJ SOLN
INTRAMUSCULAR | Status: DC | PRN
  Administered 2020-02-19 (×3): 25 ug via INTRAVENOUS

## 2020-02-19 MED ORDER — FENTANYL CITRATE (PF) 100 MCG/2ML IJ SOLN
INTRAMUSCULAR | Status: AC
  Filled 2020-02-19: qty 2

## 2020-02-19 MED ORDER — MIDAZOLAM HCL 2 MG/2ML IJ SOLN
INTRAMUSCULAR | Status: DC | PRN
  Administered 2020-02-19 (×3): 1 mg via INTRAVENOUS

## 2020-02-19 MED ORDER — HEPARIN (PORCINE) IN NACL 1000-0.9 UT/500ML-% IV SOLN
INTRAVENOUS | Status: DC | PRN
  Administered 2020-02-19: 500 mL

## 2020-02-19 MED ORDER — SODIUM CHLORIDE 0.9% FLUSH: 3.0000 mL | INTRAVENOUS | Status: DC | PRN

## 2020-02-19 MED ORDER — LIDOCAINE HCL (PF) 1 % IJ SOLN
INTRAMUSCULAR | Status: DC | PRN
  Administered 2020-02-19: 2 mL via INTRADERMAL

## 2020-02-19 MED ORDER — HEPARIN (PORCINE) IN NACL 1000-0.9 UT/500ML-% IV SOLN
INTRAVENOUS | Status: AC
  Filled 2020-02-19: qty 500

## 2020-02-19 MED ORDER — SODIUM CHLORIDE 0.9% FLUSH: 3.0000 mL | Freq: Two times a day (BID) | INTRAVENOUS | Status: DC

## 2020-02-19 MED ORDER — IODIXANOL 320 MG/ML IV SOLN
INTRAVENOUS | Status: DC | PRN
  Administered 2020-02-19: 75 mL

## 2020-02-19 SURGICAL SUPPLY — 20 items
BAG SNAP BAND KOVER 36X36 (MISCELLANEOUS) ×3 IMPLANT
BALLN MUSTANG 7X60X75 (BALLOONS) ×3
BALLOON MUSTANG 7X60X75 (BALLOONS) ×2 IMPLANT
CATH ANGIO 5F BER2 100CM (CATHETERS) ×3 IMPLANT
CATH ANGIO 5F BER2 65CM (CATHETERS) ×3 IMPLANT
CATH QUICKCROSS SUPP .035X90CM (MICROCATHETER) ×3 IMPLANT
COVER DOME SNAP 22 D (MISCELLANEOUS) ×3 IMPLANT
DEVICE TORQUE .025-.038 (MISCELLANEOUS) ×3 IMPLANT
GLIDEWIRE ADV .035X260CM (WIRE) ×3 IMPLANT
GUIDEWIRE ANGLED .035X150CM (WIRE) ×3 IMPLANT
KIT ENCORE 26 ADVANTAGE (KITS) ×3 IMPLANT
KIT MICROPUNCTURE NIT STIFF (SHEATH) ×3 IMPLANT
PROTECTION STATION PRESSURIZED (MISCELLANEOUS) ×3
SHEATH PINNACLE R/O II 6F 4CM (SHEATH) ×3 IMPLANT
SHEATH PROBE COVER 6X72 (BAG) ×6 IMPLANT
STATION PROTECTION PRESSURIZED (MISCELLANEOUS) ×2 IMPLANT
STOPCOCK MORSE 400PSI 3WAY (MISCELLANEOUS) ×3 IMPLANT
TRAY PV CATH (CUSTOM PROCEDURE TRAY) ×3 IMPLANT
TUBING CIL FLEX 10 FLL-RA (TUBING) ×3 IMPLANT
WIRE BENTSON .035X145CM (WIRE) ×3 IMPLANT

## 2020-02-19 NOTE — Progress Notes (Signed)
Notified Dr. Trula Slade of ca level .84.  No new orders.

## 2020-02-19 NOTE — Discharge Instructions (Signed)

## 2020-02-19 NOTE — Op Note (Signed)
Patient name: Jerry [name] MRN: [id number] DOB: 01-11-1971 Sex: male  02/19/2020 Pre-operative Diagnosis: End-stage renal disease, SVC syndrome Post-operative diagnosis:  Same Surgeon:  Annamarie Major Procedure Performed:  1.  Ultrasound-guided access, right cephalic vein  2.  Fistulogram  3.  Venoplasty, cephalic vein (peripheral vein)  4.  Conscious sedation, 64 minutes     Indications: The patient had developed an infection in his right radiocephalic fistula.  A tunnel catheter was placed on the left side and since that time he has been complaining of face and neck swelling.  He comes in today for better evaluation.  Procedure:  The patient was identified in the holding area and taken to room 8.  The patient was then placed supine on the table and prepped and draped in the usual sterile fashion.  A time out was called.  Conscious sedation was administered with the use of IV fentanyl and Versed under continuous physician and nurse monitoring.  Heart rate, blood pressure, and oxygen saturations were continuously monitored.  Total sedation time was 64 minutes ultrasound was used to evaluate the fistula.  The vein was patent and compressible.  A digital ultrasound image was acquired.  The fistula was then accessed under ultrasound guidance using a micropuncture needle.  An 018 wire was then asvanced without resistance and a micropuncture sheath was placed.  Contrast injections were then performed through the sheath.  Findings: The cephalic vein fistula is patent throughout the arm however there are numerous branches and several areas of greater than 70% stenosis.  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   Intervention: After above images were acquired the decision made to proceed with intervention.  A 6 French sheath was inserted.  I then used a Berenstein 2 catheter and a Glidewire to get access up to the central  venous system.  I attempted to recanalize the occluded brachiocephalic vein with a Glidewire and a Berenstein catheter as well as a Glidewire advantage and a quick cross catheter.  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  Since the patient only developed symptoms after his catheter was placed, I elected to treat the cephalic vein fistula stenosis.  I performed balloon angioplasty of essentially the entire cephalic vein beginning at the wrist up to the axilla using a 7 x 60 Mustang balloon.  Once this was completed follow-up imaging showed that the stenosis within the vein had essentially resolved to less than 15% throughout.  Sheaths and wires were removed and suture closure was performed.  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  If he is able to successfully use his fistula, I would remove his catheter and see if his symptoms go away.  If they do not, we could consider repeating his fistulogram, this time also coming from below to see if the superior vena cava and innominate vein can be recanalized and ballooned or stented.  If this is not successful, he would require ligation of his fistula, and a new access    V. Annamarie Major, M.D., FACS [facility] Office:  [phone] Pager:  [phone]

## 2020-02-19 NOTE — H&P (Signed)
   Patient name: Jerry [name] MRN: [id number] DOB: 1970-09-07 Sex: male    HISTORY OF PRESENT ILLNESS:   Jerry [name] is a 49 y.o. male s/p right RCF which is being rested for infection.  Ever since a [facility] was placed, he has had neck and face swelling  CURRENT MEDICATIONS:    Current Facility-Administered Medications  Medication Dose Route Frequency Provider Last Rate Last Admin  . sodium chloride flush (NS) 0.9 % injection 3 mL  3 mL Intravenous Q12H Serafina Mitchell, MD      . sodium chloride flush (NS) 0.9 % injection 3 mL  3 mL Intravenous PRN Serafina Mitchell, MD        REVIEW OF SYSTEMS:   [X]  denotes positive finding, [ ]  denotes negative finding Cardiac  Comments:  Chest pain or chest pressure:    Shortness of breath upon exertion:    Short of breath when lying flat:    Irregular heart rhythm:    Constitutional    Fever or chills:      PHYSICAL EXAM:   Vitals:   02/19/20 0723  BP: (!) 143/95  Pulse: 96  Resp: 18  Temp: 98.9 F (37.2 C)  TempSrc: Oral  SpO2: 100%  Weight: 110 kg  Height: 5\' 8"  (1.727 m)    GENERAL: The patient is a well-nourished male, in no acute distress. The vital signs are documented above. CARDIOVASCULAR: There is a regular rate and rhythm. PULMONARY: Non-labored respirations Drainage from AVF.  +thrill  STUDIES:      MEDICAL ISSUES:   SVC syndrome.  We discussed doing a fistulogram to evaluate for stenosis.  Intervention will be done if indicated  Annamarie Major, IV, MD, FACS [facility] [phone] Pager [phone]

## 2020-02-20 ENCOUNTER — Encounter (HOSPITAL_COMMUNITY): Payer: Self-pay | Admitting: Surgery

## 2020-03-10 DIAGNOSIS — T782XXA Anaphylactic shock, unspecified, initial encounter: Secondary | ICD-10-CM | POA: Insufficient documentation

## 2020-03-10 DIAGNOSIS — T7840XA Allergy, unspecified, initial encounter: Secondary | ICD-10-CM | POA: Insufficient documentation

## 2020-05-22 ENCOUNTER — Encounter (HOSPITAL_COMMUNITY): Payer: Self-pay | Admitting: Emergency Medicine

## 2020-05-22 ENCOUNTER — Other Ambulatory Visit: Payer: Self-pay

## 2020-05-22 ENCOUNTER — Encounter (HOSPITAL_COMMUNITY)
Admission: EM | Disposition: A | Discharge status: Home / Self Care | Payer: Self-pay | Source: Home / Self Care | Attending: Vascular Surgery

## 2020-05-22 ENCOUNTER — Inpatient Hospital Stay (HOSPITAL_COMMUNITY)
Admission: EM | Admit: 2020-05-22 | Discharge: 2020-05-24 | DRG: 252 | Disposition: A | Discharge status: Home / Self Care | Payer: Medicare (Managed Care) | Attending: Vascular Surgery | Admitting: Vascular Surgery

## 2020-05-22 DIAGNOSIS — Z992 Dependence on renal dialysis: Secondary | ICD-10-CM

## 2020-05-22 DIAGNOSIS — Y838 Other surgical procedures as the cause of abnormal reaction of the patient, or of later complication, without mention of misadventure at the time of the procedure: Secondary | ICD-10-CM | POA: Diagnosis present

## 2020-05-22 DIAGNOSIS — J9600 Acute respiratory failure, unspecified whether with hypoxia or hypercapnia: Secondary | ICD-10-CM

## 2020-05-22 DIAGNOSIS — T82838A Hemorrhage of vascular prosthetic devices, implants and grafts, initial encounter: Secondary | ICD-10-CM | POA: Insufficient documentation

## 2020-05-22 DIAGNOSIS — F17290 Nicotine dependence, other tobacco product, uncomplicated: Secondary | ICD-10-CM | POA: Diagnosis present

## 2020-05-22 DIAGNOSIS — J95821 Acute postprocedural respiratory failure: Secondary | ICD-10-CM | POA: Diagnosis not present

## 2020-05-22 DIAGNOSIS — I120 Hypertensive chronic kidney disease with stage 5 chronic kidney disease or end stage renal disease: Secondary | ICD-10-CM | POA: Diagnosis present

## 2020-05-22 DIAGNOSIS — Z6835 Body mass index (BMI) 35.0-35.9, adult: Secondary | ICD-10-CM

## 2020-05-22 DIAGNOSIS — E8889 Other specified metabolic disorders: Secondary | ICD-10-CM | POA: Diagnosis present

## 2020-05-22 DIAGNOSIS — Z79899 Other long term (current) drug therapy: Secondary | ICD-10-CM

## 2020-05-22 DIAGNOSIS — Z9889 Other specified postprocedural states: Secondary | ICD-10-CM

## 2020-05-22 DIAGNOSIS — G4733 Obstructive sleep apnea (adult) (pediatric): Secondary | ICD-10-CM | POA: Diagnosis present

## 2020-05-22 DIAGNOSIS — Z4659 Encounter for fitting and adjustment of other gastrointestinal appliance and device: Secondary | ICD-10-CM

## 2020-05-22 DIAGNOSIS — E875 Hyperkalemia: Secondary | ICD-10-CM | POA: Diagnosis present

## 2020-05-22 DIAGNOSIS — Z940 Kidney transplant status: Secondary | ICD-10-CM

## 2020-05-22 DIAGNOSIS — Z20822 Contact with and (suspected) exposure to covid-19: Secondary | ICD-10-CM | POA: Diagnosis present

## 2020-05-22 DIAGNOSIS — I4892 Unspecified atrial flutter: Secondary | ICD-10-CM | POA: Diagnosis not present

## 2020-05-22 DIAGNOSIS — N2581 Secondary hyperparathyroidism of renal origin: Secondary | ICD-10-CM | POA: Diagnosis present

## 2020-05-22 DIAGNOSIS — D631 Anemia in chronic kidney disease: Secondary | ICD-10-CM | POA: Diagnosis present

## 2020-05-22 DIAGNOSIS — Z978 Presence of other specified devices: Secondary | ICD-10-CM

## 2020-05-22 DIAGNOSIS — Z841 Family history of disorders of kidney and ureter: Secondary | ICD-10-CM

## 2020-05-22 DIAGNOSIS — N186 End stage renal disease: Secondary | ICD-10-CM | POA: Diagnosis present

## 2020-05-22 DIAGNOSIS — E669 Obesity, unspecified: Secondary | ICD-10-CM | POA: Diagnosis present

## 2020-05-22 DIAGNOSIS — Z8249 Family history of ischemic heart disease and other diseases of the circulatory system: Secondary | ICD-10-CM

## 2020-05-22 DIAGNOSIS — J385 Laryngeal spasm: Secondary | ICD-10-CM | POA: Diagnosis not present

## 2020-05-22 DIAGNOSIS — Z9114 Patient's other noncompliance with medication regimen: Secondary | ICD-10-CM

## 2020-05-22 HISTORY — PX: REVISON OF ARTERIOVENOUS FISTULA: SHX6074

## 2020-05-22 SURGERY — REVISON OF ARTERIOVENOUS FISTULA
Anesthesia: General | Site: Arm Lower | Laterality: Right

## 2020-05-22 MED ORDER — PROPOFOL 10 MG/ML IV BOLUS
INTRAVENOUS | Status: AC
  Filled 2020-05-22: qty 40

## 2020-05-22 MED ORDER — MIDAZOLAM HCL 2 MG/2ML IJ SOLN
INTRAMUSCULAR | Status: AC
  Filled 2020-05-22: qty 2

## 2020-05-22 MED ORDER — FENTANYL CITRATE (PF) 250 MCG/5ML IJ SOLN
INTRAMUSCULAR | Status: AC
  Filled 2020-05-22: qty 5

## 2020-05-22 SURGICAL SUPPLY — 32 items
BNDG ELASTIC 4X5.8 VLCR STR LF (GAUZE/BANDAGES/DRESSINGS) ×2 IMPLANT
CANISTER SUCT 3000ML PPV (MISCELLANEOUS) ×2 IMPLANT
CLIP VESOCCLUDE MED 6/CT (CLIP) ×2 IMPLANT
CLIP VESOCCLUDE SM WIDE 6/CT (CLIP) ×2 IMPLANT
COVER WAND RF STERILE (DRAPES) ×2 IMPLANT
DERMABOND ADVANCED (GAUZE/BANDAGES/DRESSINGS) ×1
DERMABOND ADVANCED .7 DNX12 (GAUZE/BANDAGES/DRESSINGS) ×1 IMPLANT
DRSG EMULSION OIL 3X3 NADH (GAUZE/BANDAGES/DRESSINGS) ×2 IMPLANT
ELECT REM PT RETURN 9FT ADLT (ELECTROSURGICAL) ×2
ELECTRODE REM PT RTRN 9FT ADLT (ELECTROSURGICAL) ×1 IMPLANT
GAUZE SPONGE 4X4 12PLY STRL (GAUZE/BANDAGES/DRESSINGS) ×2 IMPLANT
GLOVE BIO SURGEON STRL SZ7.5 (GLOVE) ×2 IMPLANT
GOWN STRL REUS W/ TWL LRG LVL3 (GOWN DISPOSABLE) ×2 IMPLANT
GOWN STRL REUS W/ TWL XL LVL3 (GOWN DISPOSABLE) ×1 IMPLANT
GOWN STRL REUS W/TWL LRG LVL3 (GOWN DISPOSABLE) ×2
GOWN STRL REUS W/TWL XL LVL3 (GOWN DISPOSABLE) ×1
KIT BASIN OR (CUSTOM PROCEDURE TRAY) ×2 IMPLANT
KIT TURNOVER KIT B (KITS) ×2 IMPLANT
NS IRRIG 1000ML POUR BTL (IV SOLUTION) ×2 IMPLANT
PACK CV ACCESS (CUSTOM PROCEDURE TRAY) ×2 IMPLANT
PAD ARMBOARD 7.5X6 YLW CONV (MISCELLANEOUS) ×4 IMPLANT
SUT ETHILON 3 0 PS 1 (SUTURE) ×2 IMPLANT
SUT MNCRL AB 4-0 PS2 18 (SUTURE) IMPLANT
SUT PROLENE 5 0 C 1 24 (SUTURE) ×4 IMPLANT
SUT PROLENE 6 0 BV (SUTURE) IMPLANT
SUT PROLENE 6 0 CC (SUTURE) ×2 IMPLANT
SUT SILK 0 TIES 10X30 (SUTURE) IMPLANT
SUT VIC AB 3-0 SH 27 (SUTURE)
SUT VIC AB 3-0 SH 27X BRD (SUTURE) IMPLANT
TOWEL GREEN STERILE (TOWEL DISPOSABLE) ×2 IMPLANT
UNDERPAD 30X36 HEAVY ABSORB (UNDERPADS AND DIAPERS) ×2 IMPLANT
WATER STERILE IRR 1000ML POUR (IV SOLUTION) ×2 IMPLANT

## 2020-05-22 NOTE — ED Provider Notes (Signed)
[facility] EMERGENCY DEPARTMENT Provider Note   CSN: [id number] Arrival date & time: 05/22/20  1802     History Chief Complaint  Patient presents with  . Vascular Access Problem    Jerry [name] is a 49 y.o. male.  The history is provided by the patient and medical records. No language interpreter was used.     49 year old male significant history of end-stage renal disease currently on dialysis brought here via EMS from home for evaluation of bleeding graft.Patient is a Monday Wednesday Friday dialysis schedule. He has a chronic ulceration to his right AV fistula for more than a month. States that it occasionally will bleed and he would apply a dressing to it which will help.  Today approximately 5 hrs ago it spontaneously started to bleed profusely.  He applied pressure and contact EMS. EMS applied pressure dressing and a tourniquet to help with the bleed and brought him here.  Currently denies any significant pain.  No recent trauma.  Has had similar bleeding graft ulcers at another site in the past requiring surgical repair.   Past Medical History:  Diagnosis Date  . Complication of anesthesia    "benadryl knocks me out; worse than any normal reactions; anesthesia/numbing RX wear off FAST" (01/30/2018)  . ESRD (end stage renal disease) on dialysis ([place])    "HAD DIALYSIS BEFORE TRANSPLANT; RESTARTED DIALYSIS 01/30/2018)  . Hypertension   . OSA on CPAP   . Renal disorder     Patient Active Problem List   Diagnosis Date Noted  . Methicillin susceptible Staphylococcus aureus infection, unspecified site 02/05/2020  . Coagulation defect, unspecified ([place]) 02/04/2020  . Cellulitis 02/02/2020  . Right arm cellulitis 02/02/2020  . Fluid overload, unspecified 06/13/2019  . Headache, unspecified 05/16/2019  . Shortness of breath 05/09/2019  . Urinary tract infection, site not specified 04/25/2019  . Encounter for immunization 04/09/2019  . Bacterial infection,  unspecified 01/17/2019  . Encounter for removal of sutures 11/14/2018  . Anemia in chronic kidney disease 02/05/2018  . End stage renal disease ([place]) 02/05/2018  . Iron deficiency anemia, unspecified 02/05/2018  . Other secondary hypertension 02/05/2018  . Secondary hyperparathyroidism of renal origin ([place]) 02/05/2018  . ARF (acute renal failure) ([place]) 01/29/2018  . Hypertensive urgency 01/29/2018  . Anemia 01/29/2018  . Other and unspecified anterior pituitary hyperfunction 06/14/2009  . Hypertrophy of breast 06/03/2009  . Sperm absent 06/03/2009    Past Surgical History:  Procedure Laterality Date  . A/V FISTULAGRAM N/A 02/19/2020   Procedure: A/V FISTULAGRAM;  Surgeon: Serafina Mitchell, MD;  Location: [place] CV [facility];  Service: Cardiovascular;  Laterality: N/A;  . AV FISTULA PLACEMENT Right 2014  . AV FISTULA PLACEMENT Right 12/26/2019   Procedure: Repair and Revision of Right Forearm AV Fistula;  Surgeon: Rosetta Posner, MD;  Location: Home;  Service: Vascular;  Laterality: Right;  . INSERTION OF DIALYSIS CATHETER Left 02/02/2020   Procedure: INSERTION OF LEFT INTERNAL JUGULAR 28 CM TUNNELED DIALYSIS CATHETER UNDER ULTRASOUND GUIDANCE;  Surgeon: Marty Heck, MD;  Location: [place];  Service: Vascular;  Laterality: Left;  . KIDNEY TRANSPLANT  2014   at [facility]  . PARATHYROIDECTOMY  2014   at [facility]  . PERIPHERAL VASCULAR INTERVENTION Right 02/19/2020   Procedure: PERIPHERAL VASCULAR INTERVENTION;  Surgeon: Serafina Mitchell, MD;  Location: [place] CV [facility];  Service: Cardiovascular;  Laterality: Right;       Family History  Problem Relation Age of Onset  .  Heart failure Mother   . Kidney failure Father     Social History   Tobacco Use  . Smoking status: Current Some Day Smoker    Years: 4.00    Types: Cigars  . Smokeless tobacco: Never Used  Vaping Use  . Vaping Use: Some days  Substance Use Topics  . Alcohol use: Yes    Alcohol/week: 2.0 standard drinks    Types:  2 Standard drinks or equivalent per week  . Drug use: Not Currently    Home Medications Prior to Admission medications   Medication Sig Start Date End Date Taking? Authorizing Provider  acetaminophen (TYLENOL) 500 MG tablet Take 1,000 mg by mouth every 6 (six) hours as needed for mild pain.   Yes [provider]  calcium acetate (PHOSLO) 667 MG capsule Take 2,001 mg by mouth with breakfast, with lunch, and with evening meal. 667 mg with snacks 12/01/19  Yes [provider]  losartan (COZAAR) 25 MG tablet Take 25 mg by mouth at bedtime. 08/06/19  Yes [provider]  multivitamin (RENA-VIT) TABS tablet Take 1 tablet by mouth daily.   Yes [provider]    Allergies    Patient has no known allergies.  Review of Systems   Review of Systems  Constitutional: Negative for fever.  Skin: Positive for wound.  Neurological: Negative for numbness.    Physical Exam Updated Vital Signs BP (!) 161/112 (BP Location: Left Arm)   Pulse 88   Temp 98.3 F (36.8 C) (Oral)   Resp 18   Ht 5\' 9"  (1.753 m)   Wt 110 kg   SpO2 100%   BMI 35.81 kg/m   Physical Exam Vitals and nursing note reviewed.  Constitutional:      General: He is not in acute distress.    Appearance: He is well-developed.  HENT:     Head: Atraumatic.  Eyes:     Conjunctiva/sclera: Conjunctivae normal.  Musculoskeletal:     Cervical back: Neck supple.     Comments: R distal AV fistula with pressure dressing in place.  Dressing removed, and a large 3cm ulcerated defect noted at the fistula site, bleeding profusely once pressure dressing removed.  Intact palpable thrills distally.  Radial pulse palpable.   Skin:    Findings: No rash.  Neurological:     Mental Status: He is alert.     ED Results / Procedures / Treatments   Labs (all labs ordered are listed, but only abnormal results are displayed) Labs Reviewed  RESPIRATORY PANEL BY RT PCR (FLU A&B, COVID)     EKG None  Radiology No results found.  Procedures Procedures (including critical care time)  Medications Ordered in ED Medications - No data to display  ED Course  I have reviewed the triage vital signs and the nursing notes.  Pertinent labs & imaging results that were available during my care of the patient were reviewed by me and considered in my medical decision making (see chart for details).    MDM Rules/Calculators/A&P                          BP (!) 161/112 (BP Location: Left Arm)   Pulse 88   Temp 98.3 F (36.8 C) (Oral)   Resp 18   Ht 5\' 9"  (1.753 m)   Wt 110 kg   SpO2 100%   BMI 35.81 kg/m   Final Clinical Impression(s) / ED Diagnoses Final diagnoses:  Bleeding from dialysis shunt, initial encounter ([place])    Rx / DC Orders ED Discharge Orders    None     11:01 PM Pt with chronic ulcer overlying his R AV fistula that spontaneously bleed today approximately 5 hrs ago.  No pain, no trauma.  Once I removed the pressure dressing profused bleeding resumed. I immediately change dressing and reapply pressure dressing.  I have consulted vascular surgeon Dr. Donzetta Matters who agrees to see pt.  Pt made NPO.  Last meal was 10am today.    Care discussed with Dr. Roslynn Amble who will resume care.    Domenic Moras, PA-C 05/22/20 2315    Davonna Belling, MD 05/23/20 0005    Lucrezia Starch, MD 05/23/20 0010

## 2020-05-22 NOTE — H&P (Signed)
Hospital Consult    Reason for Consult: Bleeding right arm AV fistula Referring Physician: Dr. Roslynn Amble MRN #:  [id number]  History of Present Illness: This is a 49 y.o. male history of end-stage renal disease.  Currently dialyzing via left IJ catheter.  Has had previous issues with his right forearm AV fistula.  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  States that he had the ulcer for many weeks is why he is using catheter.  Today started bleeding.  Last meal was at 10 AM.  Past Medical History:  Diagnosis Date  . Complication of anesthesia    "benadryl knocks me out; worse than any normal reactions; anesthesia/numbing RX wear off FAST" (01/30/2018)  . ESRD (end stage renal disease) on dialysis ([place])    "HAD DIALYSIS BEFORE TRANSPLANT; RESTARTED DIALYSIS 01/30/2018)  . Hypertension   . OSA on CPAP   . Renal disorder     Past Surgical History:  Procedure Laterality Date  . A/V FISTULAGRAM N/A 02/19/2020   Procedure: A/V FISTULAGRAM;  Surgeon: Serafina Mitchell, MD;  Location: [place] [facility];  Service: Cardiovascular;  Laterality: N/A;  . AV FISTULA PLACEMENT Right 2014  . AV FISTULA PLACEMENT Right 12/26/2019   Procedure: Repair and Revision of Right Forearm AV Fistula;  Surgeon: Rosetta Posner, MD;  Location: [facility];  Service: Vascular;  Laterality: Right;  . INSERTION OF DIALYSIS CATHETER Left 02/02/2020   Procedure: INSERTION OF LEFT INTERNAL JUGULAR 28 CM TUNNELED DIALYSIS CATHETER UNDER ULTRASOUND GUIDANCE;  Surgeon: Marty Heck, MD;  Location: [place];  Service: Vascular;  Laterality: Left;  . KIDNEY TRANSPLANT  2014   at [facility]  . PARATHYROIDECTOMY  2014   at [facility]  . PERIPHERAL VASCULAR INTERVENTION Right 02/19/2020   Procedure: PERIPHERAL VASCULAR INTERVENTION;  Surgeon: Serafina Mitchell, MD;  Location: [place] CV LAB;  Service: Cardiovascular;  Laterality: Right;    No Known Allergies  Prior to Admission medications   Medication Sig Start Date End Date  Taking? Authorizing Provider  acetaminophen (TYLENOL) 500 MG tablet Take 1,000 mg by mouth every 6 (six) hours as needed for mild pain.   Yes [provider]  calcium acetate (PHOSLO) 667 MG capsule Take 2,001 mg by mouth with breakfast, with lunch, and with evening meal. 667 mg with snacks 12/01/19  Yes [provider]  losartan (COZAAR) 25 MG tablet Take 25 mg by mouth at bedtime. 08/06/19  Yes [provider]  multivitamin (RENA-VIT) TABS tablet Take 1 tablet by mouth daily.   Yes [provider]    Social History   Socioeconomic History  . Marital status: Divorced    Spouse name: Not on file  . Number of children: Not on file  . Years of education: Not on file  . Highest education level: Not on file  Occupational History  . Not on file  Tobacco Use  . Smoking status: Current Some Day Smoker    Years: 4.00    Types: Cigars  . Smokeless tobacco: Never Used  Vaping Use  . Vaping Use: Some days  Substance and Sexual Activity  . Alcohol use: Yes    Alcohol/week: 2.0 standard drinks    Types: 2 Standard drinks or equivalent per week  . Drug use: Not Currently  . Sexual activity: Yes  Other Topics Concern  . Not on file  Social History Narrative  . Not on file   Social Determinants of Health   Financial Resource Strain:   .  Difficulty of Paying Living Expenses: Not on file  Food Insecurity:   . Worried About Charity fundraiser in the Last Year: Not on file  . Ran Out of Food in the Last Year: Not on file  Transportation Needs:   . Lack of Transportation (Medical): Not on file  . Lack of Transportation (Non-Medical): Not on file  Physical Activity:   . Days of Exercise per Week: Not on file  . Minutes of Exercise per Session: Not on file  Stress:   . Feeling of Stress : Not on file  Social Connections:   . Frequency of Communication with Friends and Family: Not on file  . Frequency of Social Gatherings with Friends and Family: Not on  file  . Attends Religious Services: Not on file  . Active Member of Clubs or Organizations: Not on file  . Attends Archivist Meetings: Not on file  . Marital Status: Not on file  Intimate Partner Violence:   . Fear of Current or Ex-Partner: Not on file  . Emotionally Abused: Not on file  . Physically Abused: Not on file  . Sexually Abused: Not on file     Family History  Problem Relation Age of Onset  . Heart failure Mother   . Kidney failure Father     ROS: Bleeding right forearm AV fistula   Physical Examination  Vitals:   05/22/20 2033 05/22/20 2146  BP: (!) 139/123 (!) 161/112  Pulse: 93 88  Resp: 18 18  Temp: 98.4 F (36.9 C) 98.3 F (36.8 C)  SpO2: 100% 100%   Body mass index is 35.81 kg/m.  General:  nad HENT: WNL Pulmonary: normal non-labored breathing Cardiac: Palpable right radial pulse Abdomen: soft, nt Extremities: Right upper extremity has a pressure dressing in place.  I attempted to remove the pressure dressing there appears to be a dime size hole in the AV fistula with high-volume bleeding Neurologic: SENSATION: normal; MOTOR FUNCTION:  moving all extremities equally. Speech is clear  CBC    Component Value Date/Time   WBC 9.9 02/03/2020 0415   RBC 3.49 (L) 02/03/2020 0415   HGB 12.2 (L) 02/19/2020 0900   HCT 36.0 (L) 02/19/2020 0900   PLT 484 (H) 02/03/2020 0415   MCV 94.3 02/03/2020 0415   MCH 28.1 02/03/2020 0415   MCHC 29.8 (L) 02/03/2020 0415   RDW 17.1 (H) 02/03/2020 0415   LYMPHSABS 1.8 02/02/2020 0834   MONOABS 0.7 02/02/2020 0834   EOSABS 0.8 (H) 02/02/2020 0834   BASOSABS 0.1 02/02/2020 0834    BMET    Component Value Date/Time   NA 139 02/19/2020 0900   K 5.3 (H) 02/19/2020 0900   CL 97 (L) 02/19/2020 0900   CO2 22 02/04/2020 1033   GLUCOSE 89 02/19/2020 0900   BUN 53 (H) 02/19/2020 0900   CREATININE 10.60 (H) 02/19/2020 0900   CALCIUM 7.4 (L) 02/04/2020 1033   GFRNONAA 4 (L) 02/04/2020 1033   GFRAA 5 (L)  02/04/2020 1033    COAGS: Lab Results  Component Value Date   INR 1.0 12/11/2019      Vascular Imaging:   No new studies  I reviewed his most recent fistulogram which 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 male history of end-stage renal disease currently dialyzing via left IJ TDC.  He had a previously placed right forearm AV fistula but has not been dialyzing due to ulceration.  Now  he has frank bleeding.  He is indicated for operative intervention.  I discussed with him the possibility of ligation of fistula particularly due to previous interventions as well as known central venous occlusion.  Patient does have a catheter.  We will admit him overnight for observation plan for discharge tomorrow.  Covid test has been sent.  [place] Ganesh C. Donzetta Matters, MD [facility] [place] Office: [phone] Pager: [phone]

## 2020-05-22 NOTE — ED Triage Notes (Signed)
Pt to triage via [facility] from home.  Reports dialysis graft to right forearm started bleeding just PTA.  Pressure bandage in place.  Last dialysis yesterday.  Pt hypertensive.

## 2020-05-23 ENCOUNTER — Emergency Department (HOSPITAL_COMMUNITY): Payer: Medicare (Managed Care) | Admitting: Anesthesiology

## 2020-05-23 ENCOUNTER — Encounter (HOSPITAL_COMMUNITY): Payer: Self-pay | Admitting: Anesthesiology

## 2020-05-23 ENCOUNTER — Observation Stay (HOSPITAL_COMMUNITY): Payer: Medicare (Managed Care)

## 2020-05-23 DIAGNOSIS — T82838A Hemorrhage of vascular prosthetic devices, implants and grafts, initial encounter: Secondary | ICD-10-CM | POA: Diagnosis not present

## 2020-05-23 DIAGNOSIS — T82898A Other specified complication of vascular prosthetic devices, implants and grafts, initial encounter: Secondary | ICD-10-CM | POA: Diagnosis not present

## 2020-05-23 DIAGNOSIS — E875 Hyperkalemia: Secondary | ICD-10-CM | POA: Diagnosis present

## 2020-05-23 DIAGNOSIS — N186 End stage renal disease: Secondary | ICD-10-CM

## 2020-05-23 DIAGNOSIS — I120 Hypertensive chronic kidney disease with stage 5 chronic kidney disease or end stage renal disease: Secondary | ICD-10-CM | POA: Diagnosis not present

## 2020-05-23 DIAGNOSIS — J95821 Acute postprocedural respiratory failure: Secondary | ICD-10-CM

## 2020-05-23 DIAGNOSIS — E8889 Other specified metabolic disorders: Secondary | ICD-10-CM | POA: Diagnosis present

## 2020-05-23 DIAGNOSIS — Z6835 Body mass index (BMI) 35.0-35.9, adult: Secondary | ICD-10-CM | POA: Diagnosis not present

## 2020-05-23 DIAGNOSIS — D631 Anemia in chronic kidney disease: Secondary | ICD-10-CM | POA: Diagnosis present

## 2020-05-23 DIAGNOSIS — Y838 Other surgical procedures as the cause of abnormal reaction of the patient, or of later complication, without mention of misadventure at the time of the procedure: Secondary | ICD-10-CM | POA: Diagnosis present

## 2020-05-23 DIAGNOSIS — Z940 Kidney transplant status: Secondary | ICD-10-CM | POA: Diagnosis not present

## 2020-05-23 DIAGNOSIS — Z79899 Other long term (current) drug therapy: Secondary | ICD-10-CM | POA: Diagnosis not present

## 2020-05-23 DIAGNOSIS — Z841 Family history of disorders of kidney and ureter: Secondary | ICD-10-CM | POA: Diagnosis not present

## 2020-05-23 DIAGNOSIS — I4892 Unspecified atrial flutter: Secondary | ICD-10-CM | POA: Diagnosis not present

## 2020-05-23 DIAGNOSIS — N2581 Secondary hyperparathyroidism of renal origin: Secondary | ICD-10-CM | POA: Diagnosis not present

## 2020-05-23 DIAGNOSIS — Z20822 Contact with and (suspected) exposure to covid-19: Secondary | ICD-10-CM | POA: Diagnosis not present

## 2020-05-23 DIAGNOSIS — E669 Obesity, unspecified: Secondary | ICD-10-CM | POA: Diagnosis present

## 2020-05-23 DIAGNOSIS — Z9114 Patient's other noncompliance with medication regimen: Secondary | ICD-10-CM | POA: Diagnosis not present

## 2020-05-23 DIAGNOSIS — Z9889 Other specified postprocedural states: Secondary | ICD-10-CM | POA: Diagnosis not present

## 2020-05-23 DIAGNOSIS — Z992 Dependence on renal dialysis: Secondary | ICD-10-CM | POA: Diagnosis not present

## 2020-05-23 DIAGNOSIS — Z8249 Family history of ischemic heart disease and other diseases of the circulatory system: Secondary | ICD-10-CM | POA: Diagnosis not present

## 2020-05-23 DIAGNOSIS — J385 Laryngeal spasm: Secondary | ICD-10-CM | POA: Diagnosis not present

## 2020-05-23 DIAGNOSIS — G4733 Obstructive sleep apnea (adult) (pediatric): Secondary | ICD-10-CM | POA: Diagnosis not present

## 2020-05-23 DIAGNOSIS — F17290 Nicotine dependence, other tobacco product, uncomplicated: Secondary | ICD-10-CM | POA: Diagnosis not present

## 2020-05-23 LAB — RENAL FUNCTION PANEL
Albumin: 3.4 g/dL — ABNORMAL LOW (ref 3.5–5.0)
Anion gap: 21 — ABNORMAL HIGH (ref 5–15)
BUN: 84 mg/dL — ABNORMAL HIGH (ref 6–20)
CO2: 20 mmol/L — ABNORMAL LOW (ref 22–32)
Calcium: 7.2 mg/dL — ABNORMAL LOW (ref 8.9–10.3)
Chloride: 97 mmol/L — ABNORMAL LOW (ref 98–111)
Creatinine, Ser: 13.83 mg/dL — ABNORMAL HIGH (ref 0.61–1.24)
GFR, Estimated: 4 mL/min — ABNORMAL LOW (ref >60)
Glucose, Bld: 100 mg/dL — ABNORMAL HIGH (ref 70–99)
Phosphorus: 7.1 mg/dL — ABNORMAL HIGH (ref 2.5–4.6)
Potassium: 4.7 mmol/L (ref 3.5–5.1)
Sodium: 138 mmol/L (ref 135–145)

## 2020-05-23 LAB — COMPREHENSIVE METABOLIC PANEL
ALT: 14 U/L (ref 0–44)
AST: 52 U/L — ABNORMAL HIGH (ref 15–41)
Albumin: 3.2 g/dL — ABNORMAL LOW (ref 3.5–5.0)
Alkaline Phosphatase: 78 U/L (ref 38–126)
Anion gap: 16 — ABNORMAL HIGH (ref 5–15)
BUN: 66 mg/dL — ABNORMAL HIGH (ref 6–20)
CO2: 18 mmol/L — ABNORMAL LOW (ref 22–32)
Calcium: 6.8 mg/dL — ABNORMAL LOW (ref 8.9–10.3)
Chloride: 100 mmol/L (ref 98–111)
Creatinine, Ser: 11.64 mg/dL — ABNORMAL HIGH (ref 0.61–1.24)
GFR, Estimated: 5 mL/min — ABNORMAL LOW (ref >60)
Glucose, Bld: 128 mg/dL — ABNORMAL HIGH (ref 70–99)
Potassium: 4.8 mmol/L (ref 3.5–5.1)
Sodium: 134 mmol/L — ABNORMAL LOW (ref 135–145)
Total Bilirubin: 0.7 mg/dL (ref 0.3–1.2)
Total Protein: 7.2 g/dL (ref 6.5–8.1)

## 2020-05-23 LAB — CBC
HCT: 34.9 % — ABNORMAL LOW (ref 39.0–52.0)
HCT: 35.2 % — ABNORMAL LOW (ref 39.0–52.0)
Hemoglobin: 10.5 g/dL — ABNORMAL LOW (ref 13.0–17.0)
Hemoglobin: 10.5 g/dL — ABNORMAL LOW (ref 13.0–17.0)
MCH: 28.1 pg (ref 26.0–34.0)
MCH: 27.7 pg (ref 26.0–34.0)
MCHC: 30.1 g/dL (ref 30.0–36.0)
MCHC: 29.8 g/dL — ABNORMAL LOW (ref 30.0–36.0)
MCV: 93.3 fL (ref 80.0–100.0)
MCV: 92.9 fL (ref 80.0–100.0)
Platelets: 316 10*3/uL (ref 150–400)
Platelets: 316 10*3/uL (ref 150–400)
RBC: 3.74 MIL/uL — ABNORMAL LOW (ref 4.22–5.81)
RBC: 3.79 MIL/uL — ABNORMAL LOW (ref 4.22–5.81)
RDW: 17.7 % — ABNORMAL HIGH (ref 11.5–15.5)
RDW: 17.5 % — ABNORMAL HIGH (ref 11.5–15.5)
WBC: 11.4 10*3/uL — ABNORMAL HIGH (ref 4.0–10.5)
WBC: 9.5 10*3/uL (ref 4.0–10.5)
nRBC: 0 % (ref 0.0–0.2)
nRBC: 0 % (ref 0.0–0.2)

## 2020-05-23 LAB — POCT I-STAT, CHEM 8
BUN: 68 mg/dL — ABNORMAL HIGH (ref 6–20)
Calcium, Ion: 0.92 mmol/L — ABNORMAL LOW (ref 1.15–1.40)
Chloride: 103 mmol/L (ref 98–111)
Creatinine, Ser: 13.3 mg/dL — ABNORMAL HIGH (ref 0.61–1.24)
Glucose, Bld: 98 mg/dL (ref 70–99)
HCT: 40 % (ref 39.0–52.0)
Hemoglobin: 13.6 g/dL (ref 13.0–17.0)
Potassium: 5.1 mmol/L (ref 3.5–5.1)
Sodium: 139 mmol/L (ref 135–145)
TCO2: 24 mmol/L (ref 22–32)

## 2020-05-23 LAB — GLUCOSE, CAPILLARY
Glucose-Capillary: 118 mg/dL — ABNORMAL HIGH (ref 70–99)
Glucose-Capillary: 104 mg/dL — ABNORMAL HIGH (ref 70–99)
Glucose-Capillary: 109 mg/dL — ABNORMAL HIGH (ref 70–99)

## 2020-05-23 LAB — BASIC METABOLIC PANEL
Anion gap: 18 — ABNORMAL HIGH (ref 5–15)
BUN: 75 mg/dL — ABNORMAL HIGH (ref 6–20)
CO2: 16 mmol/L — ABNORMAL LOW (ref 22–32)
Calcium: 7.2 mg/dL — ABNORMAL LOW (ref 8.9–10.3)
Chloride: 101 mmol/L (ref 98–111)
Creatinine, Ser: 13.13 mg/dL — ABNORMAL HIGH (ref 0.61–1.24)
GFR, Estimated: 4 mL/min — ABNORMAL LOW (ref >60)
Glucose, Bld: 103 mg/dL — ABNORMAL HIGH (ref 70–99)
Potassium: 6.8 mmol/L (ref 3.5–5.1)
Sodium: 135 mmol/L (ref 135–145)

## 2020-05-23 LAB — TRIGLYCERIDES: Triglycerides: 873 mg/dL — ABNORMAL HIGH (ref <150)

## 2020-05-23 LAB — MRSA PCR SCREENING: MRSA by PCR: NEGATIVE

## 2020-05-23 LAB — RESPIRATORY PANEL BY RT PCR (FLU A&B, COVID)
Influenza A by PCR: NEGATIVE
Influenza B by PCR: NEGATIVE
SARS Coronavirus 2 by RT PCR: NEGATIVE

## 2020-05-23 LAB — POTASSIUM: Potassium: 6.6 mmol/L (ref 3.5–5.1)

## 2020-05-23 MED ORDER — ACETAMINOPHEN 325 MG RE SUPP: 325.0000 mg | RECTAL | Status: DC | PRN

## 2020-05-23 MED ORDER — PHENYLEPHRINE HCL-NACL 10-0.9 MG/250ML-% IV SOLN
INTRAVENOUS | Status: DC | PRN
  Administered 2020-05-23: 50 ug/min via INTRAVENOUS

## 2020-05-23 MED ORDER — HEPARIN SODIUM (PORCINE) 1000 UNIT/ML IJ SOLN
INTRAMUSCULAR | Status: AC
  Filled 2020-05-23: qty 3

## 2020-05-23 MED ORDER — PENTAFLUOROPROP-TETRAFLUOROETH EX AERO: 1.0000 "application " | INHALATION_SPRAY | CUTANEOUS | Status: DC | PRN

## 2020-05-23 MED ORDER — PANTOPRAZOLE SODIUM 40 MG PO TBEC
40.0000 mg | DELAYED_RELEASE_TABLET | Freq: Every day | ORAL | Status: DC
  Administered 2020-05-23: 40 mg via ORAL
  Filled 2020-05-23: qty 1

## 2020-05-23 MED ORDER — SODIUM CHLORIDE 0.9 % IV SOLN: INTRAVENOUS | Status: DC | PRN

## 2020-05-23 MED ORDER — SODIUM CHLORIDE 0.9% FLUSH: 3.0000 mL | INTRAVENOUS | Status: DC | PRN

## 2020-05-23 MED ORDER — HYDROMORPHONE HCL 1 MG/ML IJ SOLN: 0.5000 mg | INTRAMUSCULAR | Status: DC | PRN

## 2020-05-23 MED ORDER — HEPARIN SODIUM (PORCINE) 1000 UNIT/ML DIALYSIS
3000.0000 [IU] | Freq: Once | INTRAMUSCULAR | Status: AC
  Administered 2020-05-23: 3000 [IU] via INTRAVENOUS_CENTRAL

## 2020-05-23 MED ORDER — POTASSIUM CHLORIDE CRYS ER 20 MEQ PO TBCR: 20.0000 meq | EXTENDED_RELEASE_TABLET | Freq: Once | ORAL | Status: DC

## 2020-05-23 MED ORDER — CISATRACURIUM BESYLATE (PF) 10 MG/5ML IV SOLN
INTRAVENOUS | Status: DC | PRN
  Administered 2020-05-23: 20 mg via INTRAVENOUS

## 2020-05-23 MED ORDER — SODIUM ZIRCONIUM CYCLOSILICATE 10 G PO PACK
10.0000 g | PACK | Freq: Once | ORAL | Status: AC
  Administered 2020-05-23: 10 g via ORAL
  Filled 2020-05-23: qty 1

## 2020-05-23 MED ORDER — POLYETHYLENE GLYCOL 3350 17 G PO PACK
17.0000 g | PACK | Freq: Every day | ORAL | Status: DC
  Administered 2020-05-23: 17 g via ORAL
  Filled 2020-05-23: qty 1

## 2020-05-23 MED ORDER — DEXAMETHASONE SODIUM PHOSPHATE 10 MG/ML IJ SOLN
INTRAMUSCULAR | Status: DC | PRN
  Administered 2020-05-23: 5 mg via INTRAVENOUS

## 2020-05-23 MED ORDER — ACETAMINOPHEN 325 MG PO TABS: 325.0000 mg | ORAL_TABLET | ORAL | Status: DC | PRN

## 2020-05-23 MED ORDER — OXYCODONE-ACETAMINOPHEN 5-325 MG PO TABS
1.0000 | ORAL_TABLET | ORAL | Status: DC | PRN
  Administered 2020-05-24 (×2): 2 via ORAL
  Filled 2020-05-23 (×2): qty 2

## 2020-05-23 MED ORDER — LOSARTAN POTASSIUM 25 MG PO TABS
25.0000 mg | ORAL_TABLET | Freq: Every day | ORAL | Status: DC
  Administered 2020-05-24: 25 mg via ORAL
  Filled 2020-05-23: qty 1

## 2020-05-23 MED ORDER — PHENOL 1.4 % MT LIQD: 1.0000 | OROMUCOSAL | Status: DC | PRN

## 2020-05-23 MED ORDER — DOCUSATE SODIUM 50 MG/5ML PO LIQD
100.0000 mg | Freq: Two times a day (BID) | ORAL | Status: DC
  Administered 2020-05-23 – 2020-05-24 (×2): 100 mg via ORAL
  Filled 2020-05-23 (×2): qty 10

## 2020-05-23 MED ORDER — GUAIFENESIN-DM 100-10 MG/5ML PO SYRP: 15.0000 mL | ORAL_SOLUTION | ORAL | Status: DC | PRN

## 2020-05-23 MED ORDER — FENTANYL CITRATE (PF) 100 MCG/2ML IJ SOLN: 50.0000 ug | Freq: Once | INTRAMUSCULAR | Status: DC

## 2020-05-23 MED ORDER — GLYCOPYRROLATE 0.2 MG/ML IJ SOLN
INTRAMUSCULAR | Status: DC | PRN
  Administered 2020-05-23: .6 mg via INTRAVENOUS

## 2020-05-23 MED ORDER — CALCIUM ACETATE (PHOS BINDER) 667 MG PO CAPS
2001.0000 mg | ORAL_CAPSULE | Freq: Three times a day (TID) | ORAL | Status: DC
  Administered 2020-05-23 (×2): 2001 mg via ORAL
  Filled 2020-05-23 (×2): qty 3

## 2020-05-23 MED ORDER — ONDANSETRON HCL 4 MG/2ML IJ SOLN: 4.0000 mg | Freq: Four times a day (QID) | INTRAMUSCULAR | Status: DC | PRN

## 2020-05-23 MED ORDER — SODIUM CHLORIDE 0.9 % IV SOLN: 100.0000 mL | INTRAVENOUS | Status: DC | PRN

## 2020-05-23 MED ORDER — LIDOCAINE-PRILOCAINE 2.5-2.5 % EX CREA
1.0000 "application " | TOPICAL_CREAM | CUTANEOUS | Status: DC | PRN
  Filled 2020-05-23: qty 5

## 2020-05-23 MED ORDER — HEPARIN SODIUM (PORCINE) 1000 UNIT/ML IJ SOLN
INTRAMUSCULAR | Status: AC
  Administered 2020-05-24: 1000 [IU]
  Filled 2020-05-23: qty 4

## 2020-05-23 MED ORDER — SODIUM CHLORIDE 0.9 % IV SOLN
INTRAVENOUS | Status: DC | PRN
  Administered 2020-05-23: 500 mL

## 2020-05-23 MED ORDER — ORAL CARE MOUTH RINSE
15.0000 mL | Freq: Two times a day (BID) | OROMUCOSAL | Status: DC
  Administered 2020-05-24: 15 mL via OROMUCOSAL

## 2020-05-23 MED ORDER — LIDOCAINE HCL (CARDIAC) PF 100 MG/5ML IV SOSY
PREFILLED_SYRINGE | INTRAVENOUS | Status: DC | PRN
  Administered 2020-05-23: 60 mg via INTRAVENOUS

## 2020-05-23 MED ORDER — SODIUM CHLORIDE 0.9% FLUSH
3.0000 mL | Freq: Two times a day (BID) | INTRAVENOUS | Status: DC
  Administered 2020-05-24: 3 mL via INTRAVENOUS

## 2020-05-23 MED ORDER — FENTANYL BOLUS VIA INFUSION
50.0000 ug | INTRAVENOUS | Status: DC | PRN
  Administered 2020-05-23: 50 ug via INTRAVENOUS
  Filled 2020-05-23: qty 50

## 2020-05-23 MED ORDER — HEPARIN SODIUM (PORCINE) 1000 UNIT/ML DIALYSIS: 1000.0000 [IU] | INTRAMUSCULAR | Status: DC | PRN

## 2020-05-23 MED ORDER — CISATRACURIUM BESYLATE 20 MG/10ML IV SOLN
INTRAVENOUS | Status: AC
  Filled 2020-05-23: qty 10

## 2020-05-23 MED ORDER — METOPROLOL TARTRATE 5 MG/5ML IV SOLN: 2.0000 mg | INTRAVENOUS | Status: DC | PRN

## 2020-05-23 MED ORDER — ONDANSETRON HCL 4 MG/2ML IJ SOLN
INTRAMUSCULAR | Status: DC | PRN
  Administered 2020-05-23: 4 mg via INTRAVENOUS

## 2020-05-23 MED ORDER — CHLORHEXIDINE GLUCONATE CLOTH 2 % EX PADS: 6.0000 | MEDICATED_PAD | Freq: Every day | CUTANEOUS | Status: DC

## 2020-05-23 MED ORDER — LIDOCAINE HCL (PF) 1 % IJ SOLN: 5.0000 mL | INTRAMUSCULAR | Status: DC | PRN

## 2020-05-23 MED ORDER — NEOSTIGMINE METHYLSULFATE 10 MG/10ML IV SOLN
INTRAVENOUS | Status: DC | PRN
  Administered 2020-05-23: 4 mg via INTRAVENOUS

## 2020-05-23 MED ORDER — 0.9 % SODIUM CHLORIDE (POUR BTL) OPTIME
TOPICAL | Status: DC | PRN
  Administered 2020-05-23: 1000 mL

## 2020-05-23 MED ORDER — CALCIUM GLUCONATE-NACL 1-0.675 GM/50ML-% IV SOLN
1.0000 g | Freq: Once | INTRAVENOUS | Status: AC
  Administered 2020-05-23: 1000 mg via INTRAVENOUS
  Filled 2020-05-23: qty 50

## 2020-05-23 MED ORDER — FENTANYL 2500MCG IN NS 250ML (10MCG/ML) PREMIX INFUSION
INTRAVENOUS | Status: AC
  Administered 2020-05-23: 100 ug/h via INTRAVENOUS
  Filled 2020-05-23: qty 250

## 2020-05-23 MED ORDER — HYDRALAZINE HCL 20 MG/ML IJ SOLN: 5.0000 mg | INTRAMUSCULAR | Status: DC | PRN

## 2020-05-23 MED ORDER — ALUM & MAG HYDROXIDE-SIMETH 200-200-20 MG/5ML PO SUSP: 15.0000 mL | ORAL | Status: DC | PRN

## 2020-05-23 MED ORDER — ALTEPLASE 2 MG IJ SOLR: 2.0000 mg | Freq: Once | INTRAMUSCULAR | Status: DC | PRN

## 2020-05-23 MED ORDER — VASOPRESSIN 20 UNIT/ML IV SOLN
INTRAVENOUS | Status: AC
  Filled 2020-05-23: qty 1

## 2020-05-23 MED ORDER — PROPOFOL 1000 MG/100ML IV EMUL
5.0000 ug/kg/min | INTRAVENOUS | Status: DC
  Administered 2020-05-23: 60 ug/kg/min via INTRAVENOUS
  Filled 2020-05-23: qty 100

## 2020-05-23 MED ORDER — PROPOFOL 10 MG/ML IV BOLUS
INTRAVENOUS | Status: DC | PRN
  Administered 2020-05-23: 200 mg via INTRAVENOUS

## 2020-05-23 MED ORDER — FENTANYL 2500MCG IN NS 250ML (10MCG/ML) PREMIX INFUSION: 50.0000 ug/h | INTRAVENOUS | Status: DC

## 2020-05-23 MED ORDER — CEFAZOLIN SODIUM-DEXTROSE 2-3 GM-%(50ML) IV SOLR
INTRAVENOUS | Status: DC | PRN
  Administered 2020-05-23: 2 g via INTRAVENOUS

## 2020-05-23 MED ORDER — LABETALOL HCL 5 MG/ML IV SOLN: 10.0000 mg | INTRAVENOUS | Status: DC | PRN

## 2020-05-23 MED ORDER — SODIUM BICARBONATE 8.4 % IV SOLN
50.0000 meq | Freq: Once | INTRAVENOUS | Status: AC
  Administered 2020-05-23: 50 meq via INTRAVENOUS
  Filled 2020-05-23: qty 50

## 2020-05-23 MED ORDER — DOXERCALCIFEROL 4 MCG/2ML IV SOLN
1.0000 ug | INTRAVENOUS | Status: DC
  Filled 2020-05-23: qty 2

## 2020-05-23 MED ORDER — PROPOFOL 1000 MG/100ML IV EMUL
INTRAVENOUS | Status: AC
  Filled 2020-05-23: qty 100

## 2020-05-23 MED ORDER — SODIUM CHLORIDE 0.9 % IV SOLN: 250.0000 mL | INTRAVENOUS | Status: DC | PRN

## 2020-05-23 MED ORDER — ORAL CARE MOUTH RINSE
15.0000 mL | OROMUCOSAL | Status: DC
  Administered 2020-05-23 (×2): 15 mL via OROMUCOSAL

## 2020-05-23 MED ORDER — CHLORHEXIDINE GLUCONATE 0.12% ORAL RINSE (MEDLINE KIT)
15.0000 mL | Freq: Two times a day (BID) | OROMUCOSAL | Status: DC
  Administered 2020-05-23: 15 mL via OROMUCOSAL

## 2020-05-23 MED ORDER — FENTANYL CITRATE (PF) 100 MCG/2ML IJ SOLN
INTRAMUSCULAR | Status: DC | PRN
  Administered 2020-05-23: 50 ug via INTRAVENOUS
  Administered 2020-05-23: 100 ug via INTRAVENOUS

## 2020-05-23 NOTE — Progress Notes (Signed)
CRITICAL VALUE ALERT  Critical Value: K+ 6.6  Date & Time Notied:  05/23/20 1457  Provider Notified: Juanell Fairly NP  Orders Received/Actions taken: See New Orders

## 2020-05-23 NOTE — Anesthesia Postprocedure Evaluation (Signed)
Anesthesia Post Note  Patient: Jerry [name]  Procedure(s) Performed: REPAIR OF RIGHT ARTERIOVENOUS FISTULA (Right Arm Lower)     Patient location during evaluation: ICU Anesthesia Type: General Level of consciousness: sedated Pain management: pain level controlled Vital Signs Assessment: post-procedure vital signs reviewed and stable Respiratory status: patient remains intubated per anesthesia plan Cardiovascular status: stable Postop Assessment: no apparent nausea or vomiting Anesthetic complications: no Comments: Patient was difficult to mask ventilate on induction. Patient became agitated, hypoxic and bradycardic after extubation. Patient re-intubated and transported to the ICU.    No complications documented.  Last Vitals:  Vitals:   05/22/20 2315 05/23/20 0006  BP: (!) 130/117 (!) 146/122  Pulse:  95  Resp:  18  Temp:  36.8 C  SpO2:  100%    Last Pain:  Vitals:   05/23/20 0005  TempSrc:   PainSc: 4                  Destina Mantei P Jaxon Flatt

## 2020-05-23 NOTE — Anesthesia Procedure Notes (Signed)
Procedure Name: Intubation Date/Time: 05/23/2020 12:47 AM Performed by: Suzy Bouchard, CRNA Pre-anesthesia Checklist: Patient identified, Emergency Drugs available, Suction available, Patient being monitored and Timeout performed Patient Re-evaluated:Patient Re-evaluated prior to induction Oxygen Delivery Method: Circle system utilized Preoxygenation: Pre-oxygenation with 100% oxygen Induction Type: IV induction Ventilation: Unable to mask ventilate Laryngoscope Size: Glidescope and 3 Grade View: Grade II Tube size: 7.5 mm Number of attempts: 1 Airway Equipment and Method: Stylet and Video-laryngoscopy Placement Confirmation: ETT inserted through vocal cords under direct vision,  positive ETCO2 and breath sounds checked- equal and bilateral Secured at: 24 cm Tube secured with: Tape Dental Injury: Teeth and Oropharynx as per pre-operative assessment  Comments: Attempted  2 handed mask ventilation with oral airway- very little air movement. Patient also w full beard.   Acceptable view with glidescope 3 (recommend 4 next time)

## 2020-05-23 NOTE — OR Nursing (Signed)
Patient extubated then re-intubated during Epic downtime and time change. See anesthesia records.

## 2020-05-23 NOTE — Anesthesia Preprocedure Evaluation (Addendum)
Anesthesia Evaluation  Patient identified by MRN, date of birth, ID band Patient awake    Reviewed: Allergy & Precautions, NPO status , Patient's Chart, lab work & pertinent test resultsPreop documentation limited or incomplete due to emergent nature of procedure.  Airway Mallampati: IV  TM Distance: >3 FB Neck ROM: Full    Dental no notable dental hx.    Pulmonary sleep apnea and Continuous Positive Airway Pressure Ventilation , Current Smoker,    Pulmonary exam normal breath sounds clear to auscultation       Cardiovascular hypertension, Pt. on medications Normal cardiovascular exam Rhythm:Regular Rate:Normal  ECG: NSR, rate 86   Neuro/Psych  Headaches, negative psych ROS   GI/Hepatic negative GI ROS, Neg liver ROS,   Endo/Other  negative endocrine ROS  Renal/GU ESRF and DialysisRenal disease     Musculoskeletal negative musculoskeletal ROS (+)   Abdominal (+) + obese,   Peds  Hematology negative hematology ROS (+)   Anesthesia Other Findings Bleeding AV Fistula Right Arm  Reproductive/Obstetrics                            Anesthesia Physical Anesthesia Plan  ASA: IV and emergent  Anesthesia Plan: General   Post-op Pain Management:    Induction: Intravenous  PONV Risk Score and Plan: 1 and Ondansetron, Dexamethasone and Treatment may vary due to age or medical condition  Airway Management Planned: Oral ETT and Video Laryngoscope Planned  Additional Equipment:   Intra-op Plan:   Post-operative Plan: Extubation in OR  Informed Consent: I have reviewed the patients History and Physical, chart, labs and discussed the procedure including the risks, benefits and alternatives for the proposed anesthesia with the patient or authorized representative who has indicated his/her understanding and acceptance.     Dental advisory given  Plan Discussed with: CRNA  Anesthesia Plan  Comments:        Anesthesia Quick Evaluation

## 2020-05-23 NOTE — Anesthesia Procedure Notes (Signed)
Procedure Name: Intubation Date/Time: 05/23/2020 1:54 AM Performed by: Suzy Bouchard, CRNA Pre-anesthesia Checklist: Patient identified, Emergency Drugs available, Suction available and Patient being monitored Oxygen Delivery Method: Circle system utilized Preoxygenation: Pre-oxygenation with 100% oxygen Ventilation: Unable to mask ventilate Laryngoscope Size: Glidescope and 4 Grade View: Grade I Tube type: Oral Tube size: 7.5 mm Number of attempts: 1 Airway Equipment and Method: Stylet and Video-laryngoscopy Placement Confirmation: ETT inserted through vocal cords under direct vision,  positive ETCO2 and breath sounds checked- equal and bilateral Secured at: 24 cm Tube secured with: Tape Dental Injury: Teeth and Oropharynx as per pre-operative assessment  Difficulty Due To: Difficulty was anticipated Future Recommendations: Recommend- induction with short-acting agent, and alternative techniques readily available Comments: Re-intubation due to airway obstruction and inability to mask ventilate.

## 2020-05-23 NOTE — Progress Notes (Addendum)
Patient extubated to 4L nasal cannula per MD orders. No complications upon extubation. Patient tolerating 4L Stout well. O2 sats maintained >96% Patient is oriented, speech is hoarse. Will continue to monitor.   Alma Friendly RN  05/23/20 1050

## 2020-05-23 NOTE — Transfer of Care (Signed)
Immediate Anesthesia Transfer of Care Note  Patient: Jerry [name]  Procedure(s) Performed: REPAIR OF RIGHT ARTERIOVENOUS FISTULA (Right Arm Lower)  Patient Location: ICU  Anesthesia Type:General  Level of Consciousness: sedated  Airway & Oxygen Therapy: Patient remains intubated per anesthesia plan and Patient placed on Ventilator (see vital sign flow sheet for setting)  Post-op Assessment: Report given to RN and Patient moving all extremities X 4  Post vital signs: Reviewed and stable  Last Vitals:  Vitals Value Taken Time  BP 137/107 05/23/20 0215  Temp    Pulse 91 05/23/20 0224  Resp 13 05/23/20 0224  SpO2 100 % 05/23/20 0224  Vitals shown include unvalidated device data.  Last Pain:  Vitals:   05/23/20 0005  TempSrc:   PainSc: 4          Complications: No complications documented.

## 2020-05-23 NOTE — Discharge Instructions (Signed)
[facility]  Discharge Instructions  AV Fistula or Graft Surgery for Dialysis Access  Please refer to the following instructions for your post-procedure care. Your surgeon or physician assistant will discuss any changes with you.  Activity  You may drive the day following your surgery, if you are comfortable and no longer taking prescription pain medication. Resume full activity as the soreness in your incision resolves.  Bathing/Showering  You may shower after you go home. Keep your incision dry for 48 hours. Do not soak in a bathtub, hot tub, or swim until the incision heals completely. You may not shower if you have a hemodialysis catheter.  Incision Care  Clean your incision with mild soap and water after 48 hours. Pat the area dry with a clean towel. You do not need a bandage unless otherwise instructed. Do not apply any ointments or creams to your incision. You may have skin glue on your incision. Do not peel it off. It will come off on its own in about one week. Your arm may swell a bit after surgery. To reduce swelling use pillows to elevate your arm so it is above your heart. Your doctor will tell you if you need to lightly wrap your arm with an ACE bandage.  Diet  Resume your normal diet. There are not special food restrictions following this procedure. In order to heal from your surgery, it is CRITICAL to get adequate nutrition. Your body requires vitamins, minerals, and protein. Vegetables are the best source of vitamins and minerals. Vegetables also provide the perfect balance of protein. Processed food has little nutritional value, so try to avoid this.  Medications  Resume taking all of your medications. If your incision is causing pain, you may take over-the counter pain relievers such as acetaminophen (Tylenol). If you were prescribed a stronger pain medication, please be aware these medications can cause nausea and constipation. Prevent  nausea by taking the medication with a snack or meal. Avoid constipation by drinking plenty of fluids and eating foods with high amount of fiber, such as fruits, vegetables, and grains.  Do not take Tylenol if you are taking prescription pain medications.  Follow up Your surgeon may want to see you in the office following your access surgery. If so, this will be arranged at the time of your surgery.  Please call us immediately for any of the following conditions:  . Increased pain, redness, drainage (pus) from your incision site . Fever of 101 degrees or higher . Severe or worsening pain at your incision site . Hand pain or numbness. .  Reduce your risk of vascular disease:  . Stop smoking. If you would like help, call [facility] at 1-800-QUIT-NOW 615-009-4558) or [place] at 412-188-9586  . Manage your cholesterol . Maintain a desired weight . Control your diabetes . Keep your blood pressure down  Dialysis  It will take several weeks to several months for your new dialysis access to be ready for use. Your surgeon will determine when it is okay to use it. Your nephrologist will continue to direct your dialysis. You can continue to use your Permcath until your new access is ready for use.   05/23/2020 Jerry [name] [id number] 03-09-1971  Surgeon(s): Waynetta Sandy, MD  Procedure(s): EVACUATION HEMATOMA   May stick graft immediately   May stick graft on designated area only:   X Do not stick right AVF for 4 weeks    If you have any questions,  please call the office at [phone].

## 2020-05-23 NOTE — Consult Note (Signed)
[name] Renal Consultation Note    Indication for Consultation:  Management of ESRD/hemodialysis; anemia, hypertension/volume and secondary hyperparathyroidism PCP:  HPI: Jerry [name] is a 49 y.o. male with ESRD on hemodialysis MWF at [facility]. 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 He has had issues with AVF since 11/2019 after presenting to ED with bleeding AVF. He had revision of AVF per Dr. Donnetta Hutching 12/26/2019. He was admitted with infection/cellulitis of AVF 07/19-07/21/2021. He was treated with ABX at OP center. He seen by Dr. Trula Slade for neck and facial swelling 02/19/2020. 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 He has had HD via [facility], has continued to follow with wound care for nonhealing ulcer on AVF.   Patient states that he was sitting on deck over weekend and "felt something wet". He had spontaneous bleeding from AVF, manually held pressure and presented to ED for further evaluation. He was seen by Dr. Donzetta Matters and taken to OR for revision of R AVF repair of ulcerated vein. He was extubated post procedure and apparently sent to 27M. Apparently he developed respiratory distress requiring  Reintubation and was brought to CCU for brief period of ventilator support. He is now extubated, alert, oriented. Dr. Donzetta Matters is recommending placement of new access.K+ was 4.8 at 0225 this AM. Repeat K+ at 1247 6.8. HGB stable at 10.5. Noted short brief runs of Aflutter on monitor which terminated without treatment. 12 lead EKG-SR with prolonged Qt-interval, otherwise unremarkable. He is alert, oriented, talkative with on C/Os other than being hungry.  Past Medical History:  Diagnosis Date  . Complication of anesthesia    "benadryl  knocks me out; worse than any normal reactions; anesthesia/numbing RX wear off FAST" (01/30/2018)  . ESRD (end stage renal disease) on dialysis ([place])    "HAD DIALYSIS BEFORE TRANSPLANT; RESTARTED DIALYSIS 01/30/2018)  . Hypertension   . OSA on CPAP   . Renal disorder    Past Surgical History:  Procedure Laterality Date  . A/V FISTULAGRAM N/A 02/19/2020   Procedure: A/V FISTULAGRAM;  Surgeon: Serafina Mitchell, MD;  Location: [place] CV LAB;  Service: Cardiovascular;  Laterality: N/A;  . AV FISTULA PLACEMENT Right 2014  . AV FISTULA PLACEMENT Right 12/26/2019   Procedure: Repair and Revision of Right Forearm AV Fistula;  Surgeon: Rosetta Posner, MD;  Location: [place];  Service: Vascular;  Laterality: Right;  . INSERTION OF DIALYSIS CATHETER Left 02/02/2020   Procedure: INSERTION OF LEFT INTERNAL JUGULAR 28 CM TUNNELED DIALYSIS CATHETER UNDER ULTRASOUND GUIDANCE;  Surgeon: Marty Heck, MD;  Location: [place];  Service: Vascular;  Laterality: Left;  . KIDNEY TRANSPLANT  2014   at [facility]  . PARATHYROIDECTOMY  2014   at [facility]  . PERIPHERAL VASCULAR INTERVENTION Right 02/19/2020   Procedure: PERIPHERAL VASCULAR INTERVENTION;  Surgeon: Serafina Mitchell, MD;  Location: [place] CV LAB;  Service: Cardiovascular;  Laterality: Right;  . REVISON OF ARTERIOVENOUS FISTULA Right 05/22/2020   Procedure: REPAIR OF RIGHT ARTERIOVENOUS FISTULA;  Surgeon: Waynetta Sandy, MD;  Location: [place];  Service: Vascular;  Laterality: Right;   Family History  Problem Relation Age of Onset  . Heart failure Mother   . Kidney failure Father    Social History:  reports that he has been  smoking cigars. He has smoked for the past 4.00 years. He has never used smokeless tobacco. He reports current alcohol use of about 2.0 standard drinks of alcohol per week. He reports previous drug use. No Known Allergies Prior to Admission medications   Medication Sig Start Date End Date Taking? Authorizing Provider   acetaminophen (TYLENOL) 500 MG tablet Take 1,000 mg by mouth every 6 (six) hours as needed for mild pain.   Yes [provider]  calcium acetate (PHOSLO) 667 MG capsule Take 2,001 mg by mouth with breakfast, with lunch, and with evening meal. 667 mg with snacks 12/01/19  Yes [provider]  losartan (COZAAR) 25 MG tablet Take 25 mg by mouth at bedtime. 08/06/19  Yes [provider]  multivitamin (RENA-VIT) TABS tablet Take 1 tablet by mouth daily.   Yes [provider]   Current Facility-Administered Medications  Medication Dose Route Frequency Provider Last Rate Last Admin  . 0.9 %  sodium chloride infusion  250 mL Intravenous PRN Baglia, Corrina, PA-C      . acetaminophen (TYLENOL) tablet 325-650 mg  325-650 mg Oral Q4H PRN Baglia, Corrina, PA-C       Or  . acetaminophen (TYLENOL) suppository 325-650 mg  325-650 mg Rectal Q4H PRN Baglia, Corrina, PA-C      . alum & mag hydroxide-simeth (MAALOX/MYLANTA) 200-200-20 MG/5ML suspension 15-30 mL  15-30 mL Oral Q2H PRN Baglia, Corrina, PA-C      . calcium acetate (PHOSLO) capsule 2,001 mg  2,001 mg Oral TID with meals Baglia, Corrina, PA-C      . chlorhexidine gluconate (MEDLINE KIT) (PERIDEX) 0.12 % solution 15 mL  15 mL Mouth Rinse BID Waynetta Sandy, MD   15 mL at 05/23/20 0745  . Chlorhexidine Gluconate Cloth 2 % PADS 6 each  6 each Topical Daily Waynetta Sandy, MD      . docusate (COLACE) 50 MG/5ML liquid 100 mg  100 mg Oral BID Margaretha Seeds, MD   100 mg at 05/23/20 1315  . fentaNYL (SUBLIMAZE) bolus via infusion 50 mcg  50 mcg Intravenous Q15 min PRN Margaretha Seeds, MD   50 mcg at 05/23/20 0738  . fentaNYL (SUBLIMAZE) injection 50 mcg  50 mcg Intravenous Once Margaretha Seeds, MD      . guaiFENesin-dextromethorphan (ROBITUSSIN DM) 100-10 MG/5ML syrup 15 mL  15 mL Oral Q4H PRN Baglia, Corrina, PA-C      . hydrALAZINE (APRESOLINE) injection 5 mg  5 mg Intravenous Q20 Min PRN  Baglia, Corrina, PA-C      . labetalol (NORMODYNE) injection 10 mg  10 mg Intravenous Q10 min PRN Baglia, Corrina, PA-C      . losartan (COZAAR) tablet 25 mg  25 mg Oral QHS Baglia, Corrina, PA-C      . MEDLINE mouth rinse  15 mL Mouth Rinse 10 times per day Waynetta Sandy, MD   15 mL at 05/23/20 1100  . metoprolol tartrate (LOPRESSOR) injection 2-5 mg  2-5 mg Intravenous Q2H PRN Baglia, Corrina, PA-C      . ondansetron (ZOFRAN) injection 4 mg  4 mg Intravenous Q6H PRN Baglia, Corrina, PA-C      . oxyCODONE-acetaminophen (PERCOCET/ROXICET) 5-325 MG per tablet 1-2 tablet  1-2 tablet Oral Q4H PRN Baglia, Corrina, PA-C      . pantoprazole (PROTONIX) EC tablet 40 mg  40 mg Oral Daily Baglia, Corrina, PA-C   40 mg at 05/23/20 1315  . phenol (CHLORASEPTIC) mouth spray 1 spray  1 spray Mouth/Throat PRN Baglia, Corrina, PA-C      . polyethylene glycol (MIRALAX / GLYCOLAX) packet 17 g  17 g Oral Daily Margaretha Seeds, MD   17 g at 05/23/20 1334  . sodium chloride flush (NS) 0.9 % injection 3 mL  3 mL Intravenous Q12H Baglia, Corrina, PA-C      . sodium chloride flush (NS) 0.9 % injection 3 mL  3 mL Intravenous PRN Karoline Caldwell, PA-C       Labs: Basic Metabolic Panel: Recent Labs  Lab 05/23/20 0038 05/23/20 0225 05/23/20 1247  NA 139 134* 135  K 5.1 4.8 6.8*  CL 103 100 101  CO2  --  18* 16*  GLUCOSE 98 128* 103*  BUN 68* 66* 75*  CREATININE 13.30* 11.64* 13.13*  CALCIUM  --  6.8* 7.2*   Liver Function Tests: Recent Labs  Lab 05/23/20 0225  AST 52*  ALT 14  ALKPHOS 78  BILITOT 0.7  PROT 7.2  ALBUMIN 3.2*   No results for input(s): LIPASE, AMYLASE in the last 168 hours. No results for input(s): AMMONIA in the last 168 hours. CBC: Recent Labs  Lab 05/23/20 0038 05/23/20 0225 05/23/20 1247  WBC  --  11.4* 9.5  HGB 13.6 10.5* 10.5*  HCT 40.0 34.9* 35.2*  MCV  --  93.3 92.9  PLT  --  316 316   Cardiac Enzymes: No results for input(s): CKTOTAL, CKMB, CKMBINDEX,  TROPONINI in the last 168 hours. CBG: Recent Labs  Lab 05/23/20 0720 05/23/20 1110  GLUCAP 118* 104*   Iron Studies: No results for input(s): IRON, TIBC, TRANSFERRIN, FERRITIN in the last 72 hours. Studies/Results: DG Abd 1 View  Result Date: 05/23/2020 CLINICAL DATA:  Evaluate OG tube placement. EXAM: ABDOMEN - 1 VIEW COMPARISON:  None FINDINGS: OG tube tip is in the left upper quadrant of the abdomen in the expected location of the gastric body. Side port is just below the expected level of the GE junction. No dilated bowel loops noted. IMPRESSION: OG tube tip is in the gastric body. Electronically Signed   By: Kerby Moors M.D.   On: 05/23/2020 04:32   DG Chest Port 1 View  Result Date: 05/23/2020 CLINICAL DATA:  Evaluate ET tube and OG tube placed EXAM: PORTABLE CHEST 1 VIEW COMPARISON:  02/02/2020 FINDINGS: ETT tip is 2.5 cm above the carina. NG tube is in place with tip below the GE junction. There is a dual lumen left-sided central venous catheter with tips at the cavoatrial junction. Stable cardiomediastinal contours. Small bilateral pleural effusions are unchanged. Decrease in pulmonary vascular congestion. IMPRESSION: 1. Support apparatus as above 2. No change in small bilateral pleural effusions. 3. Improvement in pulmonary vascular congestion. Electronically Signed   By: Kerby Moors M.D.   On: 05/23/2020 04:31    ROS: As per HPI otherwise negative.   Physical Exam: Vitals:   05/23/20 1047 05/23/20 1100 05/23/20 1112 05/23/20 1200  BP: 132/73 (!) 138/94  (!) 131/96  Pulse: 66 79  70  Resp: 16 (!) 24  10  Temp:   97.6 F (36.4 C)   TempSrc:      SpO2: 100% 95%  99%  Weight:      Height:         General: Well developed, well nourished, in no acute distress. Head: Normocephalic, atraumatic, sclera non-icteric, mucus membranes are moist Neck: Swelling noted L side of face and neck. Unable to assess JVD.  Lungs: Clear bilaterally to  auscultation without wheezes,  rales, or rhonchi. Breathing is unlabored. Heart: RRR with S1 S2. No murmurs, rubs, or gallops appreciated. SR/SB on monitor, has had short burst of A Flutter with variable conduction but unable to capture on 12 lead EKG.  Abdomen: Soft, non-tender, non-distended with normoactive bowel sounds. No rebound/guarding. No obvious abdominal masses. M-S:  Strength and tone appear normal for age. Lower extremities:without edema or ischemic changes, no open wounds  Neuro: Alert and oriented X 3. Moves all extremities spontaneously. Psych:  Responds to questions appropriately with a normal affect. Dialysis Access: [facility] Drs[facility] intact. R AVF with ace wrap, + bruit  Dialysis Orders: [facility] MWF 4 hrs 180NRe 400/800 109.5 kg 1.0 K/2.5 Ca TDC/AVF temporarily un-useable.  -Heparin 3000 units IV TIW -Hectorol 1 mcg IV TIW   Assessment/Plan: 1.  Spontaneous bleeding from AVF: S/P revision per Dr. Donzetta Matters. HGB stable. Dr. Donzetta Matters recommending new access. Patient is now agreeing with this suggestion.  2.  Hyperkalemia-Patient has H/O hyperkalemia, uses 1.0 K bath at HD center. K+ 4.8 early this AM no 6.8 without visible hemolysis. Repeating K+. If still high will temporize with bicarb, calcium gluconate, lokelma. He will have HD this evening when night HD nurse arrives.  3.  ESRD -  MWF via TDC. He does not miss HD. Uses 1.0 K bath as noted above.  4.  Hypertension/volume  - Occasional high IDWG but gets close to EDW as OP. No evidence of volume overload by exam. BP is controlled.  5.  Anemia  - HGB 10.5 today was 11.4 05/19/2020. Follow HGB.  6.  Metabolic bone disease -  C Ca 7.4 chronic issues with hyperphosphatemia. Continue binders, VDRA.  7.  Nutrition - Alb 3.2. Renal/Carb mod diet. Add renal vit, nephro 8.  OSA-doesn't wear BIPAP.   Makinzey Banes H. Owens Shark, NP-C 05/23/2020, 2:00 PM  [employer] [phone]

## 2020-05-23 NOTE — Progress Notes (Signed)
  Progress Note    05/23/2020 10:17 AM 1 Day Post-Op  Subjective: Patient was reintubated after surgery earlier this morning  Vitals:   05/23/20 0800 05/23/20 0851  BP:  104/72  Pulse: 65 64  Resp: 20 14  Temp:    SpO2:  100%    Physical Exam: Awake alert and oriented and intubated having conversations Right arm dressing clean dry intact  CBC    Component Value Date/Time   WBC 11.4 (H) 05/23/2020 0225   RBC 3.74 (L) 05/23/2020 0225   HGB 10.5 (L) 05/23/2020 0225   HCT 34.9 (L) 05/23/2020 0225   PLT 316 05/23/2020 0225   MCV 93.3 05/23/2020 0225   MCH 28.1 05/23/2020 0225   MCHC 30.1 05/23/2020 0225   RDW 17.7 (H) 05/23/2020 0225   LYMPHSABS 1.8 02/02/2020 0834   MONOABS 0.7 02/02/2020 0834   EOSABS 0.8 (H) 02/02/2020 0834   BASOSABS 0.1 02/02/2020 0834    BMET    Component Value Date/Time   NA 134 (L) 05/23/2020 0225   K 4.8 05/23/2020 0225   CL 100 05/23/2020 0225   CO2 18 (L) 05/23/2020 0225   GLUCOSE 128 (H) 05/23/2020 0225   BUN 66 (H) 05/23/2020 0225   CREATININE 11.64 (H) 05/23/2020 0225   CALCIUM 6.8 (L) 05/23/2020 0225   GFRNONAA 5 (L) 05/23/2020 0225   GFRAA 5 (L) 02/04/2020 1033    INR    Component Value Date/Time   INR 1.0 12/11/2019 1928     Intake/Output Summary (Last 24 hours) at 05/23/2020 1017 Last data filed at 05/23/2020 0600 Gross per 24 hour  Intake 904.33 ml  Output 50 ml  Net 854.33 ml     Assessment/plan:  49 y.o. male is status post primary repair of bleeding right arm AV fistula he required reintubation.  Likely can be extubated today per critical care there input much appreciated.  I discussed with he and his significant other that he is likely to need new access given the unhealthy nature of his current access.   Kersten Salmons C. Donzetta Matters, MD [facility] [place] Office: [phone] Pager: [phone]  05/23/2020 10:17 AM

## 2020-05-23 NOTE — Progress Notes (Signed)
CRITICAL VALUE ALERT  Critical Value:  K+  6.8  Date & Time Notied:  9528 05/23/20  Provider Notified: Juanell Fairly NP   Orders Received/Actions taken: See new orders

## 2020-05-23 NOTE — Consult Note (Signed)
NAME:  Jerry [name], MRN:  [id number], DOB:  27-Apr-1971, LOS: 1 ADMISSION DATE:  05/22/2020, CONSULTATION DATE:  05/23/20 REFERRING MD:  Servando Snare, MDCHIEF COMPLAINT:  Vent management  Brief History   49 year old admitted for AVF fistula ulcer bleed s/p repair on 11/7. Post-op had agitation, hypoxemia and bradycardia and was re-intubated. PCCM consulted for vent management.  History of present illness   Unable to obtain history due to intubated status. History obtained via chart review.  [name] is a 49 year old male with ESRD who presented to the ED for AVF graft bleed. PTA he had bleeding from his graft site which has a known chronic ulceration for the last month. He has had prior issues with his right forearm fistula. He underwent revision of his right arm AV fistula with repair of the ulcerated vein on 11/7. Post-op extubation patient became agitated, hypoxemic and bradycardic. He was reintubated and transferred to the ICU. PCCM consulted for vent management  Past Medical History  ESRD, HTN, chronic ulceration of right AV fistula, anemia, secondary hyperparathyroidism  Significant Hospital Events   11/7 AVF repair  Consults:  Vascular Surgery PCCM  Procedures:  11/7 AVF repair  Significant Diagnostic Tests:    Micro Data:  Covid and influenza 11/7- neg  Antimicrobials:  Cefazolin 11/6  Interim history/subjective:  As above  Objective   Blood pressure 113/79, pulse 83, temperature 97.6 F (36.4 C), temperature source Oral, resp. rate 16, height 5\' 9"  (1.753 m), weight 112.5 kg, SpO2 100 %.    Vent Mode: PRVC FiO2 (%):  [60 %] 60 % Set Rate:  [16 bmp] 16 bmp Vt Set:  [560 mL] 560 mL PEEP:  [5 cmH20] 5 cmH20 Plateau Pressure:  [21 cmH20] 21 cmH20   Intake/Output Summary (Last 24 hours) at 05/23/2020 0327 Last data filed at 05/23/2020 0300 Gross per 24 hour  Intake 733.57 ml  Output 50 ml  Net 683.57 ml   Filed Weights   05/22/20 1807 05/23/20  0215  Weight: 110 kg 112.5 kg    Physical Exam: General: Well-appearing, no acute distress, on mechanical ventilation HENT: [place], AT, ETT in place Eyes: EOMI, no scleral icterus Respiratory: Coarse breath sounds bilaterally Cardiovascular: RRR, -M/R/G, no JVD GI: BS+, soft, nontender Extremities: RUE dressing in place Neuro: Sedated, does not follow commands, moves extremities x 4 spontaneously   Resolved Hospital Problem list     Assessment & Plan:   Post-op respiratory failure: Will likely be able to extubate in the am --CXR now --Full vent support. Wean FIO2/PEEP for goal SpO2 88-95% --PAD protocol for RASS goal -1 --SBT/WUA in am --VAP  AVF ulcer bleed s/p repair --Management per Vascular surgery  HTN --PRN hydralazine and labetolol   ESRD on MWF --Daily labs --Consider Nephro consult if patient expected for extended stay  Best practice:  Diet: NPO Pain/Anxiety/Delirium protocol (if indicated): Fentanyl gtt VAP protocol (if indicated): Yes DVT prophylaxis: Per primary GI prophylaxis: PPI Glucose control: CBG q4h Mobility: Per primary Code Status: Full Family Communication: Per primary Disposition: Remain in ICU  Labs   CBC: Recent Labs  Lab 05/23/20 0038 05/23/20 0225  WBC  --  11.4*  HGB 13.6 10.5*  HCT 40.0 34.9*  MCV  --  93.3  PLT  --  539    Basic Metabolic Panel: Recent Labs  Lab 05/23/20 0038  NA 139  K 5.1  CL 103  GLUCOSE 98  BUN 68*  CREATININE 13.30*  GFR: Estimated Creatinine Clearance: 8.3 mL/min (A) (by C-G formula based on SCr of 13.3 mg/dL (H)). Recent Labs  Lab 05/23/20 0225  WBC 11.4*    Liver Function Tests: No results for input(s): AST, ALT, ALKPHOS, BILITOT, PROT, ALBUMIN in the last 168 hours. No results for input(s): LIPASE, AMYLASE in the last 168 hours. No results for input(s): AMMONIA in the last 168 hours.  ABG    Component Value Date/Time   TCO2 24 05/23/2020 0038     Coagulation Profile: No  results for input(s): INR, PROTIME in the last 168 hours.  Cardiac Enzymes: No results for input(s): CKTOTAL, CKMB, CKMBINDEX, TROPONINI in the last 168 hours.  HbA1C: No results found for: HGBA1C  CBG: No results for input(s): GLUCAP in the last 168 hours.  Review of Systems:   Unable to obtain due to intubated status  Past Medical History  He,  has a past medical history of Complication of anesthesia, ESRD (end stage renal disease) on dialysis ([place]), Hypertension, OSA on CPAP, and Renal disorder.   Surgical History    Past Surgical History:  Procedure Laterality Date  . A/V FISTULAGRAM N/A 02/19/2020   Procedure: A/V FISTULAGRAM;  Surgeon: Serafina Mitchell, MD;  Location: [place] [facility];  Service: Cardiovascular;  Laterality: N/A;  . AV FISTULA PLACEMENT Right 2014  . AV FISTULA PLACEMENT Right 12/26/2019   Procedure: Repair and Revision of Right Forearm AV Fistula;  Surgeon: Rosetta Posner, MD;  Location: [facility];  Service: Vascular;  Laterality: Right;  . INSERTION OF DIALYSIS CATHETER Left 02/02/2020   Procedure: INSERTION OF LEFT INTERNAL JUGULAR 28 CM TUNNELED DIALYSIS CATHETER UNDER ULTRASOUND GUIDANCE;  Surgeon: Marty Heck, MD;  Location: [place];  Service: Vascular;  Laterality: Left;  . KIDNEY TRANSPLANT  2014   at [facility]  . PARATHYROIDECTOMY  2014   at [facility]  . PERIPHERAL VASCULAR INTERVENTION Right 02/19/2020   Procedure: PERIPHERAL VASCULAR INTERVENTION;  Surgeon: Serafina Mitchell, MD;  Location: [place] [facility];  Service: Cardiovascular;  Laterality: Right;     Social History   reports that he has been smoking cigars. He has smoked for the past 4.00 years. He has never used smokeless tobacco. He reports current alcohol use of about 2.0 standard drinks of alcohol per week. He reports previous drug use.   Family History   His family history includes Heart failure in his mother; Kidney failure in his father.   Allergies No Known Allergies   Home Medications   Prior to Admission medications   Medication Sig Start Date End Date Taking? Authorizing Provider  acetaminophen (TYLENOL) 500 MG tablet Take 1,000 mg by mouth every 6 (six) hours as needed for mild pain.   Yes [provider]  calcium acetate (PHOSLO) 667 MG capsule Take 2,001 mg by mouth with breakfast, with lunch, and with evening meal. 667 mg with snacks 12/01/19  Yes [provider]  losartan (COZAAR) 25 MG tablet Take 25 mg by mouth at bedtime. 08/06/19  Yes [provider]  multivitamin (RENA-VIT) TABS tablet Take 1 tablet by mouth daily.   Yes [provider]     Critical care time: 35 min    The patient is critically ill with multiple organ systems failure and requires high complexity decision making for assessment and support, frequent evaluation and titration of therapies, application of advanced monitoring technologies and extensive interpretation of multiple databases.  Independent Critical Care Time: 35 Minutes.   Rodman Pickle,  M.D. [facility] Pulmonary/Critical Care Medicine 05/23/2020 3:28 AM   Please see Amion for pager number to reach on-call Pulmonary and Critical Care Team.

## 2020-05-23 NOTE — Progress Notes (Signed)
eLink Physician-Brief Progress Note Patient Name: Jerry [name] DOB: [birth year] MRN: [id number]   Date of Service  05/23/2020  HPI/Events of Note  Patient is post-op A-V fistula revision, he came up to the unit in 4 point restraints secondary to extreme agitation and bedside RN is asking for an order to cover the restraints + Propofol infusion for adequate sedation.  eICU Interventions  Restraints ordered and Propofol infusion ordered, nursing communication sent to discontinue ankle restraints when he is adequately sedated.        [name] [name] 05/23/2020, 2:38 AM

## 2020-05-23 NOTE — Procedures (Signed)
Extubation Procedure Note  Patient Details:   Name: Jerry [name] DOB: 09-27-1970 MRN: [id number]   Airway Documentation:    Vent end date: 05/23/20 Vent end time: 1043   Evaluation  O2 sats: stable throughout Complications: No apparent complications Patient did tolerate procedure well. Bilateral Breath Sounds: Diminished, Clear   Patient extubated per MD order & placed on 4L [place]. Patient able to speak & cough post extubation.  Kathie Dike 05/23/2020, 10:47 AM

## 2020-05-23 NOTE — ED Notes (Signed)
Pt undressed from waist down until we can get his shirt off

## 2020-05-23 NOTE — Plan of Care (Signed)
  Problem: Activity: Goal: Ability to tolerate increased activity will improve Outcome: Progressing   Problem: Respiratory: Goal: Ability to maintain a clear airway and adequate ventilation will improve Outcome: Progressing   Problem: Education: Goal: Knowledge of General Education information will improve Description: Including pain rating scale, medication(s)/side effects and non-pharmacologic comfort measures Outcome: Progressing   Problem: Clinical Measurements: Goal: Ability to maintain clinical measurements within normal limits will improve Outcome: Progressing Goal: Will remain free from infection Outcome: Progressing Goal: Diagnostic test results will improve Outcome: Progressing Goal: Respiratory complications will improve Outcome: Progressing Goal: Cardiovascular complication will be avoided Outcome: Progressing   Problem: Coping: Goal: Level of anxiety will decrease Outcome: Progressing   Problem: Elimination: Goal: Will not experience complications related to bowel motility Outcome: Progressing Goal: Will not experience complications related to urinary retention Outcome: Progressing   Problem: Safety: Goal: Ability to remain free from injury will improve Outcome: Progressing   Problem: Skin Integrity: Goal: Risk for impaired skin integrity will decrease Outcome: Progressing   Problem: Pain Managment: Goal: General experience of comfort will improve Outcome: Progressing

## 2020-05-23 NOTE — Op Note (Signed)
    Patient name: Jerry [name] MRN: [id number] DOB: 10-20-1970 Sex: male  05/23/2020 Pre-operative Diagnosis: esrd, bleeding right arm avf Post-operative diagnosis:  Same Surgeon:  Eda Paschal. Donzetta Matters, MD Assistant: Paulo Fruit, PA Procedure Performed: Revision of right arm AV fistula with primary repair of ulcerated vein and primary closure   Indications: 49 year old male with end-stage renal disease currently dialyzing via catheter.  He has a fistula in his right forearm this has been revised in the past has failed to fully heal since that time.  He had an ulcer overlying the fistula more central to the previous repair.  Starting today he had significant bleeding pressure was held.  He was taken to the operating room emergently for repair versus ligation of the fistula.  An assistant was necessary for suction, retraction, repair of the fistula and closure of the wound  Findings: There was a approximately a dime size ulceration over the fistula.  We dissected back to healthy appearing fistula debrided the ends of the fistula and primarily close this.  We then primarily closed the skin and soft tissue over the fistula this tissue was somewhat thickened and the skin with darkened discoloration.   Procedure:  The patient was identified in the holding area and taken to the operating room where is placed supine operative when general anesthesia induced.  He was sterilely prepped and draped in the right upper extremity after a tourniquet was applied and inflated and an Esmarch used to exsanguinate the arm given the risk of bleeding.  Timeout was called.  We ellipsed out the area of concern.  We dissected back to healthy-appearing AV fistula sharply.  We debrided the ends of the fistula.  We irrigated this with heparinized saline clamped the 2 ends of the fistula the tourniquet down.  We then allowed antegrade backbleeding to fill the fistula again thoroughly irrigated with heparinized saline and  reclamped her fistula.  We then primarily closed with a running 5-0 Prolene suture in a mattress fashion.  We then freed up the ends of the skin.  The skin was thickened and somewhat darkened discoloration.  There was no healthy tissue to close any soft tissues so we closed with interrupted nylon suture overlying the fistula.  A sterile dressing was then applied.  He was awakened from anesthesia having tolerated procedure well any complication.  All counts were correct at completion.  EBL: 50 cc  Tourniquet time: 12 minutes  Shaili Donalson C. Donzetta Matters, MD [facility] [place] Office: [phone] Pager: [phone]

## 2020-05-24 DIAGNOSIS — J95821 Acute postprocedural respiratory failure: Secondary | ICD-10-CM | POA: Diagnosis not present

## 2020-05-24 DIAGNOSIS — Z9889 Other specified postprocedural states: Secondary | ICD-10-CM | POA: Diagnosis not present

## 2020-05-24 LAB — GLUCOSE, CAPILLARY
Glucose-Capillary: 137 mg/dL — ABNORMAL HIGH (ref 70–99)
Glucose-Capillary: 150 mg/dL — ABNORMAL HIGH (ref 70–99)
Glucose-Capillary: 121 mg/dL — ABNORMAL HIGH (ref 70–99)

## 2020-05-24 MED ORDER — SODIUM ZIRCONIUM CYCLOSILICATE 10 G PO PACK
10.0000 g | PACK | Freq: Every day | ORAL | Status: DC
  Administered 2020-05-24: 10 g via ORAL
  Filled 2020-05-24: qty 1

## 2020-05-24 MED ORDER — OXYCODONE-ACETAMINOPHEN 5-325 MG PO TABS: 1.0000 | ORAL_TABLET | Freq: Four times a day (QID) | ORAL | 0 refills | Status: DC | PRN

## 2020-05-24 NOTE — Plan of Care (Signed)
  Problem: Activity: Goal: Ability to tolerate increased activity will improve Outcome: Progressing   Problem: Respiratory: Goal: Ability to maintain a clear airway and adequate ventilation will improve Outcome: Progressing   Problem: Education: Goal: Knowledge of General Education information will improve Description: Including pain rating scale, medication(s)/side effects and non-pharmacologic comfort measures Outcome: Progressing   Problem: Health Behavior/Discharge Planning: Goal: Ability to manage health-related needs will improve Outcome: Progressing   Problem: Clinical Measurements: Goal: Ability to maintain clinical measurements within normal limits will improve Outcome: Progressing Goal: Will remain free from infection Outcome: Progressing Goal: Diagnostic test results will improve Outcome: Progressing Goal: Respiratory complications will improve Outcome: Progressing Goal: Cardiovascular complication will be avoided Outcome: Progressing   Problem: Activity: Goal: Risk for activity intolerance will decrease Outcome: Progressing   Problem: Nutrition: Goal: Adequate nutrition will be maintained Outcome: Progressing   Problem: Coping: Goal: Level of anxiety will decrease Outcome: Progressing   Problem: Elimination: Goal: Will not experience complications related to bowel motility Outcome: Progressing Goal: Will not experience complications related to urinary retention Outcome: Progressing   Problem: Pain Managment: Goal: General experience of comfort will improve Outcome: Progressing   Problem: Safety: Goal: Ability to remain free from injury will improve Outcome: Progressing   Problem: Skin Integrity: Goal: Risk for impaired skin integrity will decrease Outcome: Progressing

## 2020-05-24 NOTE — Progress Notes (Deleted)
error 

## 2020-05-24 NOTE — Progress Notes (Addendum)
[facility] [place]  Subjective  - Doing well this am.   Objective (!) 89/61 95 98.7 F (37.1 C) (Oral) 13 94%  Intake/Output Summary (Last 24 hours) at 05/24/2020 0738 Last data filed at 05/24/2020 0200 Gross per 24 hour  Intake 120 ml  Output 2853 ml  Net -2733 ml   Right UE incision intact, palpable pulse right radial, motor intact and sensation Working left TDC Lungs non labored breathing   Assessment/Planning: 49 y.o. male is status post primary repair of bleeding right arm AV fistula he required reintubation. No palpable thrill in the right AV fistula He will f/u in 2-3 weeks with left UE vein  Mapping wound check and discuss new access. D/C today    Roxy Horseman 05/24/2020 7:38 AM --  Laboratory Lab Results: Recent Labs    05/23/20 0225 05/23/20 1247  WBC 11.4* 9.5  HGB 10.5* 10.5*  HCT 34.9* 35.2*  PLT 316 316   BMET Recent Labs    05/23/20 1247 05/23/20 1247 05/23/20 1421 05/23/20 2113  NA 135  --   --  138  K 6.8*   < > 6.6* 4.7  CL 101  --   --  97*  CO2 16*  --   --  20*  GLUCOSE 103*  --   --  100*  BUN 75*  --   --  84*  CREATININE 13.13*  --   --  13.83*  CALCIUM 7.2*  --   --  7.2*   < > = values in this interval not displayed.    COAG Lab Results  Component Value Date   INR 1.0 12/11/2019   No results found for: PTT  I have independently interviewed and examined patient and agree with PA assessment and plan above.   Janet Humphreys C. Donzetta Matters, MD [facility] [place] Office: [phone] Pager: [phone]

## 2020-05-24 NOTE — Progress Notes (Signed)
NAME:  Jerry [name], MRN:  [id number], DOB:  09/14/1970, LOS: 1 ADMISSION DATE:  05/22/2020, CONSULTATION DATE:  05/23/20 REFERRING MD:  Servando Snare, MDCHIEF COMPLAINT:  Vent management  Brief History   49 year old admitted for AVF fistula ulcer bleed s/p repair on 11/7. Post-op had agitation, hypoxemia and bradycardia and was re-intubated. PCCM consulted for vent management. Extubated 11/7  History of present illness   Unable to obtain history due to intubated status. History obtained via chart review.  Jerry [name] is a 49 year old male with ESRD who presented to the ED for AVF graft bleed. PTA he had bleeding from his graft site which has a known chronic ulceration for the last month. He has had prior issues with his right forearm fistula. He underwent revision of his right arm AV fistula with repair of the ulcerated vein on 11/7. Post-op extubation patient became agitated, hypoxemic and bradycardic. He was reintubated and transferred to the ICU. PCCM consulted for vent management, extubated on 11/7  Past Medical History  ESRD, HTN, chronic ulceration of right AV fistula, anemia, secondary hyperparathyroidism  Significant Hospital Events   11/7 AVF repair  Consults:  Vascular Surgery PCCM  Procedures:  11/7 AVF repair  Significant Diagnostic Tests:    Micro Data:  Covid and influenza 11/7- neg  Antimicrobials:  Cefazolin 11/6  Interim history/subjective:  - 1.8 L I/O with HD Extubated yesterday, oxygenating well on room air. Denies any complaints of shortness of breath, mild hoarse voice on exam but strong cough and no evidence of stridor.    Objective   Blood pressure (!) 89/61, pulse 95, temperature 98.7 F (37.1 C), temperature source Oral, resp. rate 13, height 5\' 9"  (1.753 m), weight 111.3 kg, SpO2 94 %.    Vent Mode: PSV;CPAP FiO2 (%):  [40 %] 40 % PEEP:  [5 cmH20] 5 cmH20 Pressure Support:  [10 cmH20] 10 cmH20 Plateau Pressure:  [13 cmH20] 13  cmH20   Intake/Output Summary (Last 24 hours) at 05/24/2020 0865 Last data filed at 05/24/2020 0200 Gross per 24 hour  Intake 120 ml  Output 2853 ml  Net -2733 ml   Filed Weights   05/23/20 0215 05/23/20 1945 05/24/20 0500  Weight: 112.5 kg 112.6 kg 111.3 kg    Physical Exam: General: Well-appearing, no acute distress, standing in room HENT: [place], AT Eyes: EOMI, no scleral icterus Respiratory: Lungs clear, symmetric expansion Cardiovascular: off telemetry, RRR, s1/s2 GI: non distended, soft, non tender Extremities: RUE incision site clean Neuro: alert and oriented, moving all extremities equally.    Resolved Hospital Problem list     Assessment & Plan:   Post-op respiratory failure: Extubated 11/7 --Pulmonary hygiene with mobilization --On room air  AVF ulcer bleed s/p repair --Management per Vascular surgery  HTN --PRN hydralazine and labetolol   ESRD on MWF --Received HD overnight, will follow up in clinic on Wed for routine HD  Best practice:  Diet: As tolerated Pain/Anxiety/Delirium protocol (if indicated): n/a VAP protocol (if indicated): n/a DVT prophylaxis: Per primary GI prophylaxis: PPI Mobility: Per primary Code Status: Full Family Communication: Per primary Disposition: Discharge per primary team, PCCM will delist  Labs   CBC: Recent Labs  Lab 05/23/20 0038 05/23/20 0225 05/23/20 1247  WBC  --  11.4* 9.5  HGB 13.6 10.5* 10.5*  HCT 40.0 34.9* 35.2*  MCV  --  93.3 92.9  PLT  --  316 784    Basic Metabolic Panel: Recent Labs  Lab 05/23/20  0038 05/23/20 0225 05/23/20 1247 05/23/20 1421 05/23/20 2113  NA 139 134* 135  --  138  K 5.1 4.8 6.8* 6.6* 4.7  CL 103 100 101  --  97*  CO2  --  18* 16*  --  20*  GLUCOSE 98 128* 103*  --  100*  BUN 68* 66* 75*  --  84*  CREATININE 13.30* 11.64* 13.13*  --  13.83*  CALCIUM  --  6.8* 7.2*  --  7.2*  PHOS  --   --   --   --  7.1*   GFR: Estimated Creatinine Clearance: 7.9 mL/min (A) (by C-G  formula based on SCr of 13.83 mg/dL (H)). Recent Labs  Lab 05/23/20 0225 05/23/20 1247  WBC 11.4* 9.5    Liver Function Tests: Recent Labs  Lab 05/23/20 0225 05/23/20 2113  AST 52*  --   ALT 14  --   ALKPHOS 78  --   BILITOT 0.7  --   PROT 7.2  --   ALBUMIN 3.2* 3.4*   No results for input(s): LIPASE, AMYLASE in the last 168 hours. No results for input(s): AMMONIA in the last 168 hours.  ABG    Component Value Date/Time   TCO2 24 05/23/2020 0038     Coagulation Profile: No results for input(s): INR, PROTIME in the last 168 hours.  Cardiac Enzymes: No results for input(s): CKTOTAL, CKMB, CKMBINDEX, TROPONINI in the last 168 hours.  HbA1C: No results found for: HGBA1C  CBG: Recent Labs  Lab 05/23/20 1110 05/23/20 1513 05/24/20 0141 05/24/20 0419 05/24/20 0638  GLUCAP 104* 109* 137* 150* 121*

## 2020-05-24 NOTE — Progress Notes (Signed)
Discharge instructions provided to patient.  He verbalizes understanding and questions answered.  IVs removed.  Awaiting ride home.

## 2020-05-24 NOTE — Progress Notes (Signed)
   05/24/20 0019  Hand-Off documentation  Handoff Given Given to shift RN/LPN  Report given to (Full Name) Kayren Eaves RN  Handoff Received Received from shift RN/LPN  Report received from (Full Name) Ry Moody, RN  Vitals  Temp 98 F (36.7 C)  Temp Source Oral  BP 100/77  MAP (mmHg) 86  BP Location Left Arm  BP Method Automatic  Pulse Rate (!) 104  ECG Heart Rate (!) 105  Resp 13  Oxygen Therapy  SpO2 98 %  O2 Device Nasal Cannula  O2 Flow Rate (L/min) 4 L/min  Patient Activity (if Appropriate) In bed  Pulse Oximetry Type Continuous  Pain Assessment  Pain Scale 0-10  Pain Score 0  Post-Hemodialysis Assessment  Rinseback Volume (mL) 250 mL  KECN 233 V  Dialyzer Clearance Lightly streaked  Duration of HD Treatment -hour(s) 4 hour(s)  Hemodialysis Intake (mL) 500 mL  UF Total -Machine (mL) 3353 mL  Net UF (mL) 2853 mL  Tolerated HD Treatment Yes  Post-Hemodialysis Comments tx achieved as espected and no complaints.  AVG/AVF Arterial Site Held (minutes) 0 minutes  AVG/AVF Venous Site Held (minutes) 0 minutes  Hemodialysis Catheter Left Internal jugular Double lumen Permanent (Tunneled)  Placement Date/Time: 02/02/20 1658   Placed prior to admission: No  Time Out: Correct patient;Correct site;Correct procedure  Maximum sterile barrier precautions: Hand hygiene;Cap;Mask;Sterile gown;Sterile gloves;Large sterile sheet  Site Prep: Chlorh...  Site Condition No complications  Blue Lumen Status Heparin locked;Capped (Central line)  Red Lumen Status Heparin locked;Capped (Central line)  Catheter fill solution Heparin 1000 units/ml  Catheter fill volume (Arterial) 2.1 cc  Catheter fill volume (Venous) 2.1  Dressing Type Gauze/Drain sponge  Dressing Status Clean;Dry;Intact  Interventions Dressing changed  Drainage Description None  Post treatment catheter status Capped and Clamped

## 2020-05-25 ENCOUNTER — Telehealth: Payer: Self-pay | Admitting: Nephrology

## 2020-05-25 NOTE — Discharge Summary (Signed)
    Kamar Callender MRN: [id number] DOB/AGE: 04-11-1971 49 y.o.  Admit date: 05/22/2020 Discharge date: 05/23/2020  Admission Diagnosis: Bleeding right arm AV fistula  Discharge Diagnoses:  Same  Secondary Diagnoses: Active Problems:   Status post surgery   ESRD (end stage renal disease) on dialysis [facility])   Procedure Performed:  Revision of right arm AV fistula with primary repair of ulcerated vein and primary closure  Discharged Condition: good  Hospital Course: he was admitted through the ED and taken emergently to the operating room for the above noted procedure.  Postoperatively the patient was reintubated likely for laryngospasm.  He was transferred to the ICU.  The following day he was extubated without issue.  We did watch him throughout the day he was dialyzed the evening of surgery which was performed in the early morning.  The following day he was discharged in good condition.  Consults:  Treatment Team:  Waynetta Sandy, MD Roney Jaffe, MD  Significant Diagnostic Studies: CBC CBC Latest Ref Rng & Units 05/23/2020 05/23/2020 05/23/2020  WBC 4.0 - 10.5 K/uL 9.5 11.4(H) -  Hemoglobin 13.0 - 17.0 g/dL 10.5(L) 10.5(L) 13.6  Hematocrit 39 - 52 % 35.2(L) 34.9(L) 40.0  Platelets 150 - 400 K/uL 316 316 -     COAG Lab Results  Component Value Date   INR 1.0 12/11/2019   No results found for: PTT  Disposition: Discharge disposition: 01-Home or Self Care       Discharge Instructions    Call MD for:  redness, tenderness, or signs of infection (pain, swelling, bleeding, redness, odor or green/yellow discharge around incision site)   Complete by: As directed    Call MD for:  severe or increased pain, loss or decreased feeling  in affected limb(s)   Complete by: As directed    Call MD for:  temperature >100.5   Complete by: As directed    Resume previous diet   Complete by: As directed      Allergies as of 05/24/2020   No Known Allergies      Medication List    TAKE these medications   acetaminophen 500 MG tablet Commonly known as: TYLENOL Take 1,000 mg by mouth every 6 (six) hours as needed for mild pain.   calcium acetate 667 MG capsule Commonly known as: PHOSLO Take 2,001 mg by mouth with breakfast, with lunch, and with evening meal. 667 mg with snacks   losartan 25 MG tablet Commonly known as: COZAAR Take 25 mg by mouth at bedtime.   multivitamin Tabs tablet Take 1 tablet by mouth daily.   oxyCODONE-acetaminophen 5-325 MG tablet Commonly known as: PERCOCET/ROXICET Take 1 tablet by mouth every 6 (six) hours as needed for moderate pain.       Follow-up Information    Waynetta Sandy, MD Follow up in 2 week(s).   Specialties: Vascular Surgery, Cardiology Why: office will call Contact information: [address] [place] [address] [phone]               Signed:  Eda Paschal. Donzetta Matters, MD [facility] Office: [phone] Pager: [phone]  05/25/2020, 9:15 AM

## 2020-05-25 NOTE — Telephone Encounter (Signed)
Transition of care contact from inpatient facility  Date of Discharge:  05/24/20 Date of Contact: 05/25/20 Method of contact: Phone  Attempted to contact patient to discuss transition of care from inpatient admission. Patient did not answer the phone. Message was left on the patient's voicemail with call back number [phone].

## 2020-06-07 ENCOUNTER — Other Ambulatory Visit: Payer: Self-pay | Admitting: *Deleted

## 2020-06-07 DIAGNOSIS — N186 End stage renal disease: Secondary | ICD-10-CM

## 2020-06-09 ENCOUNTER — Telehealth: Payer: Self-pay

## 2020-06-09 ENCOUNTER — Encounter (HOSPITAL_BASED_OUTPATIENT_CLINIC_OR_DEPARTMENT_OTHER): Payer: Medicare (Managed Care) | Admitting: Physician Assistant

## 2020-06-09 NOTE — Telephone Encounter (Signed)
Patient called to ask about stitches in his arm. Says they are pushing out a little but denies problems. We discussed the healing process, and if they are not giving him trouble, we will remove them at his f/u appt.

## 2020-06-18 ENCOUNTER — Ambulatory Visit (INDEPENDENT_AMBULATORY_CARE_PROVIDER_SITE_OTHER)
Admission: RE | Admit: 2020-06-18 | Discharge: 2020-06-18 | Disposition: A | Discharge status: Home / Self Care | Payer: Medicare (Managed Care) | Source: Ambulatory Visit | Attending: Physician Assistant | Admitting: Physician Assistant

## 2020-06-18 ENCOUNTER — Other Ambulatory Visit: Payer: Self-pay

## 2020-06-18 ENCOUNTER — Ambulatory Visit (INDEPENDENT_AMBULATORY_CARE_PROVIDER_SITE_OTHER): Payer: Medicare (Managed Care) | Admitting: Physician Assistant

## 2020-06-18 ENCOUNTER — Ambulatory Visit (HOSPITAL_COMMUNITY)
Admission: RE | Admit: 2020-06-18 | Discharge: 2020-06-18 | Disposition: A | Discharge status: Home / Self Care | Payer: Medicare (Managed Care) | Source: Ambulatory Visit | Attending: Physician Assistant | Admitting: Physician Assistant

## 2020-06-18 VITALS — BP 122/65 | HR 93 | Temp 98.5°F | Resp 20 | Ht 69.0 in | Wt 245.3 lb

## 2020-06-18 DIAGNOSIS — Z992 Dependence on renal dialysis: Secondary | ICD-10-CM | POA: Diagnosis not present

## 2020-06-18 DIAGNOSIS — N186 End stage renal disease: Secondary | ICD-10-CM

## 2020-06-18 NOTE — Progress Notes (Signed)
POST OPERATIVE OFFICE NOTE    CC:  F/u for surgery  HPI:  This is a 49 y.o. male who is s/p revision of right arm AVF with primary repair of ulcerated vein and primary closure on 05/23/2020 by Dr. Donzetta Matters.   Post extubation, pt became agitated, hypoxemic, and bradycardic and required re-intubation.  He was extubated the following day.  He did not have a palpable thrill in the right AVF and scheduled to f/u in 2-3 weeks with new BUE vein mapping and discuss new access. He is here today for that appt.   Access history: He was originally seen by [facility] in the ER in June 2021 for bleeding from his fistula.  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   -July 2021,-TDC placed by Dr. Carlis Abbott.  -August 2021 - fistulogram with venoplasty of cephalic vein (right) by Dr. Trula Slade -November 2021 - revision of right arm AVF by Dr. Donzetta Matters with subsequent clotted fistula   The pt is on dialysis M/W/F at [facility] location. His wife states they were told that he could not get a right upper arm fistula due to narrowing in the chest.  Fistulogram on 11/17/6642 revealed cephalic vein fistula is patent throughout the arm however there are numerous branches and several areas of greater than 70% stenosis.  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  The pt is ambidextrous but writes with his left hand.    No Known Allergies  Current Outpatient Medications  Medication Sig Dispense Refill  . acetaminophen (TYLENOL) 500 MG tablet Take 1,000 mg by mouth every 6 (six) hours as needed for mild pain.    . calcium acetate (PHOSLO) 667 MG capsule Take 2,001 mg by mouth with breakfast, with lunch, and with evening meal. 667 mg with snacks    . losartan (COZAAR) 25 MG tablet Take 25 mg by mouth at bedtime.    . multivitamin (RENA-VIT) TABS tablet Take 1  tablet by mouth daily.    Marland Kitchen oxyCODONE-acetaminophen (PERCOCET/ROXICET) 5-325 MG tablet Take 1 tablet by mouth every 6 (six) hours as needed for moderate pain. 8 tablet 0   Current Facility-Administered Medications  Medication Dose Route Frequency Provider Last Rate Last Admin  . 0.9 %  sodium chloride infusion  250 mL Intravenous PRN Serafina Mitchell, MD         ROS:  See HPI  Physical Exam:  Today's Vitals   06/18/20 1329  BP: 122/65  Pulse: 93  Resp: 20  Temp: 98.5 F (36.9 C)  TempSrc: Temporal  SpO2: 96%  Weight: 245 lb 4.8 oz (111.3 kg)  Height: 5\' 9"  (1.753 m)   Body mass index is 36.22 kg/m.   Incision:  Nylon sutures in place Extremities:   +palpable left radial pulse There is a well healed incision on the lateral aspect of the arm just distal to the antecubital space, where parathyroid was reimplanted     Upper extremity vein mapping for Dialysis access on 06/18/2020: +-----------------+-------------+----------+---------+  Left Cephalic  Diameter (cm)Depth (cm)Findings   +-----------------+-------------+----------+---------+  Shoulder       0.34               +-----------------+-------------+----------+---------+  Prox upper arm    0.37               +-----------------+-------------+----------+---------+  Mid upper arm    0.36               +-----------------+-------------+----------+---------+  Dist upper arm    0.33               +-----------------+-------------+----------+---------+  Antecubital fossa  0.28               +-----------------+-------------+----------+---------+  Prox forearm     0.30               +-----------------+-------------+----------+---------+  Mid forearm     0.35        branching  +-----------------+-------------+----------+---------+  Dist forearm     0.32        branching   +-----------------+-------------+----------+---------+   +-----------------+-------------+----------+--------+  Left Basilic   Diameter (cm)Depth (cm)Findings  +-----------------+-------------+----------+--------+  Mid upper arm    0.42              +-----------------+-------------+----------+--------+  Dist upper arm    0.39              +-----------------+-------------+----------+--------+  Antecubital fossa  0.41              +-----------------+-------------+----------+--------+  Prox forearm     0.36              +-----------------+-------------+----------+--------+   Arterial duplex upper extremity 06/18/2020: Location        PSV (cm/s)Intralum. Diam. (cm)Waveform  Comments  +-----------------------+----------+--------------------+---------+--------  Brachial Antecub. fossa93    0.52        triphasic      +-----------------------+----------+--------------------+---------+--------  Radial Art at Wrist  71    0.32        triphasic      +-----------------------+----------+--------------------+---------+--------  Ulnar Art at Wrist   55    0.26        triphasic      +-----------------------+----------+--------------------+---------+--------    Assessment/Plan:  This is a 49 y.o. male who has ESRD and is in need on new dialysis access   -given he is has an occluded central venous system on the right, he is being evaluated for left arm access.  Will plan for left arm fistula on a non dialysis day with Dr. Donzetta Matters.  Pt does have re-implanted parathyroid in the left forearm (see picture).  Discussed with Dr. Donzetta Matters and most likely left upper arm fistula.  I discussed BC AVF vs BVT in stages.  He understands that if we get in an evaluate vein and it is not suitable, then we will proceed with AV graft. He and wife expressed  understanding.  -sutures removed from left arm today.   Leontine Locket, [facility] [phone]  Clinic MD:  Donzetta Matters

## 2020-07-27 ENCOUNTER — Other Ambulatory Visit: Payer: Self-pay

## 2020-07-27 ENCOUNTER — Encounter (HOSPITAL_COMMUNITY): Payer: Self-pay | Admitting: Vascular Surgery

## 2020-07-27 NOTE — Progress Notes (Signed)
Jerry [name] denies chest pain or shortness of breath. Patient denies having any s/s of Covid and he has not been in contact with anyone who has S/S . Jerry [name] will be tested for Covid tomorrow- 07/28/20 and knows he need to quarantine with just the people who live in the home with him.

## 2020-07-28 ENCOUNTER — Other Ambulatory Visit (HOSPITAL_COMMUNITY)
Admission: RE | Admit: 2020-07-28 | Discharge: 2020-07-28 | Disposition: A | Discharge status: Home / Self Care | Payer: Medicare (Managed Care) | Source: Ambulatory Visit | Attending: Vascular Surgery | Admitting: Vascular Surgery

## 2020-07-28 DIAGNOSIS — Z20822 Contact with and (suspected) exposure to covid-19: Secondary | ICD-10-CM | POA: Diagnosis not present

## 2020-07-28 DIAGNOSIS — Z01812 Encounter for preprocedural laboratory examination: Secondary | ICD-10-CM | POA: Insufficient documentation

## 2020-07-28 LAB — SARS CORONAVIRUS 2 (TAT 6-24 HRS): SARS Coronavirus 2: NEGATIVE

## 2020-07-28 NOTE — Anesthesia Preprocedure Evaluation (Addendum)
Anesthesia Evaluation  Patient identified by MRN, date of birth, ID band Patient awake    Reviewed: Allergy & Precautions, NPO status , Patient's Chart, lab work & pertinent test results  History of Anesthesia Complications (+) history of anesthetic complications (laryngospasm)  Airway Mallampati: III  TM Distance: >3 FB Neck ROM: Full    Dental  (+) Dental Advisory Given   Pulmonary sleep apnea and Continuous Positive Airway Pressure Ventilation , Current Smoker and Patient abstained from smoking.,  07/28/2020 SARS coronavirus NEG   breath sounds clear to auscultation       Cardiovascular hypertension, Pt. on medications (-) angina Rhythm:Regular Rate:Normal  '19 ECHO: EF 55-60%, mild MR, mild TR   Neuro/Psych  Headaches,    GI/Hepatic negative GI ROS, Neg liver ROS,   Endo/Other  Morbid obesity  Renal/GU Dialysis and ESRFRenal disease (dialyzed yesterday, K+ 4.6)Failed transplant     Musculoskeletal   Abdominal (+) + obese,   Peds  Hematology negative hematology ROS (+)   Anesthesia Other Findings   Reproductive/Obstetrics                            Anesthesia Physical Anesthesia Plan  ASA: III  Anesthesia Plan: Regional and MAC   Post-op Pain Management:    Induction:   PONV Risk Score and Plan: Treatment may vary due to age or medical condition  Airway Management Planned: Natural Airway and Simple Face Mask  Additional Equipment: None  Intra-op Plan:   Post-operative Plan:   Informed Consent: I have reviewed the patients History and Physical, chart, labs and discussed the procedure including the risks, benefits and alternatives for the proposed anesthesia with the patient or authorized representative who has indicated his/her understanding and acceptance.     Dental advisory given  Plan Discussed with: CRNA and Surgeon  Anesthesia Plan Comments:         Anesthesia Quick Evaluation

## 2020-07-29 ENCOUNTER — Ambulatory Visit (HOSPITAL_COMMUNITY): Payer: Medicare (Managed Care) | Admitting: Certified Registered Nurse Anesthetist

## 2020-07-29 ENCOUNTER — Other Ambulatory Visit: Payer: Self-pay

## 2020-07-29 ENCOUNTER — Ambulatory Visit (HOSPITAL_COMMUNITY)
Admission: RE | Admit: 2020-07-29 | Discharge: 2020-07-29 | Disposition: A | Discharge status: Home / Self Care | Payer: Medicare (Managed Care) | Attending: Vascular Surgery | Admitting: Vascular Surgery

## 2020-07-29 ENCOUNTER — Encounter (HOSPITAL_COMMUNITY): Payer: Self-pay | Admitting: Vascular Surgery

## 2020-07-29 ENCOUNTER — Encounter (HOSPITAL_COMMUNITY)
Admission: RE | Disposition: A | Discharge status: Home / Self Care | Payer: Self-pay | Source: Home / Self Care | Attending: Vascular Surgery

## 2020-07-29 ENCOUNTER — Other Ambulatory Visit (HOSPITAL_COMMUNITY): Payer: Self-pay | Admitting: Physician Assistant

## 2020-07-29 DIAGNOSIS — Z992 Dependence on renal dialysis: Secondary | ICD-10-CM | POA: Insufficient documentation

## 2020-07-29 DIAGNOSIS — N186 End stage renal disease: Secondary | ICD-10-CM | POA: Insufficient documentation

## 2020-07-29 DIAGNOSIS — T82898A Other specified complication of vascular prosthetic devices, implants and grafts, initial encounter: Secondary | ICD-10-CM | POA: Insufficient documentation

## 2020-07-29 DIAGNOSIS — I120 Hypertensive chronic kidney disease with stage 5 chronic kidney disease or end stage renal disease: Secondary | ICD-10-CM | POA: Insufficient documentation

## 2020-07-29 DIAGNOSIS — X58XXXA Exposure to other specified factors, initial encounter: Secondary | ICD-10-CM | POA: Diagnosis not present

## 2020-07-29 DIAGNOSIS — Z79899 Other long term (current) drug therapy: Secondary | ICD-10-CM | POA: Diagnosis not present

## 2020-07-29 DIAGNOSIS — Z940 Kidney transplant status: Secondary | ICD-10-CM | POA: Diagnosis not present

## 2020-07-29 DIAGNOSIS — N185 Chronic kidney disease, stage 5: Secondary | ICD-10-CM | POA: Diagnosis not present

## 2020-07-29 HISTORY — PX: AV FISTULA PLACEMENT: SHX1204

## 2020-07-29 HISTORY — DX: Personal history of other medical treatment: Z92.89

## 2020-07-29 HISTORY — DX: Pneumonia, unspecified organism: J18.9

## 2020-07-29 LAB — POCT I-STAT, CHEM 8
BUN: 43 mg/dL — ABNORMAL HIGH (ref 6–20)
Calcium, Ion: 0.79 mmol/L — CL (ref 1.15–1.40)
Chloride: 101 mmol/L (ref 98–111)
Creatinine, Ser: 9.8 mg/dL — ABNORMAL HIGH (ref 0.61–1.24)
Glucose, Bld: 89 mg/dL (ref 70–99)
HCT: 41 % (ref 39.0–52.0)
Hemoglobin: 13.9 g/dL (ref 13.0–17.0)
Potassium: 4.6 mmol/L (ref 3.5–5.1)
Sodium: 139 mmol/L (ref 135–145)
TCO2: 28 mmol/L (ref 22–32)

## 2020-07-29 SURGERY — ARTERIOVENOUS (AV) FISTULA CREATION
Anesthesia: Monitor Anesthesia Care | Site: Arm Upper | Laterality: Left

## 2020-07-29 MED ORDER — FENTANYL CITRATE (PF) 100 MCG/2ML IJ SOLN
INTRAMUSCULAR | Status: DC | PRN
  Administered 2020-07-29 (×6): 25 ug via INTRAVENOUS

## 2020-07-29 MED ORDER — HYDROMORPHONE HCL 1 MG/ML IJ SOLN: 0.2500 mg | INTRAMUSCULAR | Status: DC | PRN

## 2020-07-29 MED ORDER — ORAL CARE MOUTH RINSE: 15.0000 mL | Freq: Once | OROMUCOSAL | Status: DC

## 2020-07-29 MED ORDER — MIDAZOLAM HCL 2 MG/2ML IJ SOLN: 0.5000 mg | Freq: Once | INTRAMUSCULAR | Status: DC | PRN

## 2020-07-29 MED ORDER — OXYCODONE HCL 5 MG/5ML PO SOLN: 5.0000 mg | Freq: Once | ORAL | Status: DC | PRN

## 2020-07-29 MED ORDER — OXYCODONE-ACETAMINOPHEN 5-325 MG PO TABS: 1.0000 | ORAL_TABLET | Freq: Four times a day (QID) | ORAL | 0 refills | Status: DC | PRN

## 2020-07-29 MED ORDER — MEPERIDINE HCL 25 MG/ML IJ SOLN: 6.2500 mg | INTRAMUSCULAR | Status: DC | PRN

## 2020-07-29 MED ORDER — MIDAZOLAM HCL 5 MG/5ML IJ SOLN
INTRAMUSCULAR | Status: DC | PRN
  Administered 2020-07-29: 2 mg via INTRAVENOUS

## 2020-07-29 MED ORDER — PAPAVERINE HCL 30 MG/ML IJ SOLN
INTRAMUSCULAR | Status: AC
  Filled 2020-07-29: qty 2

## 2020-07-29 MED ORDER — CEFAZOLIN SODIUM-DEXTROSE 2-4 GM/100ML-% IV SOLN
2.0000 g | INTRAVENOUS | Status: AC
  Administered 2020-07-29: 2 g via INTRAVENOUS
  Filled 2020-07-29: qty 100

## 2020-07-29 MED ORDER — SODIUM CHLORIDE 0.9 % IV SOLN
INTRAVENOUS | Status: DC | PRN
  Administered 2020-07-29: 500 mL

## 2020-07-29 MED ORDER — PROMETHAZINE HCL 25 MG/ML IJ SOLN: 6.2500 mg | INTRAMUSCULAR | Status: DC | PRN

## 2020-07-29 MED ORDER — FENTANYL CITRATE (PF) 250 MCG/5ML IJ SOLN
INTRAMUSCULAR | Status: AC
  Filled 2020-07-29: qty 5

## 2020-07-29 MED ORDER — PROPOFOL 10 MG/ML IV BOLUS
INTRAVENOUS | Status: AC
  Filled 2020-07-29: qty 20

## 2020-07-29 MED ORDER — SODIUM CHLORIDE 0.9 % IV SOLN: INTRAVENOUS | Status: DC

## 2020-07-29 MED ORDER — LIDOCAINE-EPINEPHRINE (PF) 1 %-1:200000 IJ SOLN
INTRAMUSCULAR | Status: DC | PRN
  Administered 2020-07-29: 12 mL

## 2020-07-29 MED ORDER — SODIUM CHLORIDE 0.9 % IV SOLN
INTRAVENOUS | Status: AC
  Filled 2020-07-29: qty 1.2

## 2020-07-29 MED ORDER — LACTATED RINGERS IV SOLN: INTRAVENOUS | Status: DC

## 2020-07-29 MED ORDER — OXYCODONE HCL 5 MG PO TABS: 5.0000 mg | ORAL_TABLET | Freq: Once | ORAL | Status: DC | PRN

## 2020-07-29 MED ORDER — 0.9 % SODIUM CHLORIDE (POUR BTL) OPTIME
TOPICAL | Status: DC | PRN
  Administered 2020-07-29: 1000 mL

## 2020-07-29 MED ORDER — CHLORHEXIDINE GLUCONATE 0.12 % MT SOLN: 15.0000 mL | Freq: Once | OROMUCOSAL | Status: DC

## 2020-07-29 MED ORDER — ONDANSETRON HCL 4 MG/2ML IJ SOLN
INTRAMUSCULAR | Status: AC
  Filled 2020-07-29: qty 2

## 2020-07-29 MED ORDER — LIDOCAINE 2% (20 MG/ML) 5 ML SYRINGE
INTRAMUSCULAR | Status: AC
  Filled 2020-07-29: qty 5

## 2020-07-29 MED ORDER — LIDOCAINE-EPINEPHRINE (PF) 1 %-1:200000 IJ SOLN
INTRAMUSCULAR | Status: AC
  Filled 2020-07-29: qty 30

## 2020-07-29 MED ORDER — MIDAZOLAM HCL 2 MG/2ML IJ SOLN
INTRAMUSCULAR | Status: AC
  Filled 2020-07-29: qty 2

## 2020-07-29 MED ORDER — LIDOCAINE-EPINEPHRINE (PF) 1.5 %-1:200000 IJ SOLN
INTRAMUSCULAR | Status: DC | PRN
  Administered 2020-07-29: 30 mL via PERINEURAL

## 2020-07-29 MED ORDER — SODIUM CHLORIDE 0.9 % IV SOLN: INTRAVENOUS | Status: DC | PRN

## 2020-07-29 MED ORDER — PROPOFOL 500 MG/50ML IV EMUL
INTRAVENOUS | Status: DC | PRN
  Administered 2020-07-29: 25 ug/kg/min via INTRAVENOUS

## 2020-07-29 MED ORDER — ONDANSETRON HCL 4 MG/2ML IJ SOLN
INTRAMUSCULAR | Status: DC | PRN
  Administered 2020-07-29: 4 mg via INTRAVENOUS

## 2020-07-29 MED FILL — OXYCODONE-APAP 5-325MG: 5-325 | 2 days supply | Qty: 10 | Fill #0

## 2020-07-29 SURGICAL SUPPLY — 29 items
ARMBAND PINK RESTRICT EXTREMIT (MISCELLANEOUS) ×2 IMPLANT
CANISTER SUCT 3000ML PPV (MISCELLANEOUS) ×2 IMPLANT
CLIP VESOCCLUDE MED 6/CT (CLIP) ×2 IMPLANT
CLIP VESOCCLUDE SM WIDE 6/CT (CLIP) ×4 IMPLANT
COVER PROBE W GEL 5X96 (DRAPES) ×2 IMPLANT
COVER WAND RF STERILE (DRAPES) IMPLANT
DERMABOND ADVANCED (GAUZE/BANDAGES/DRESSINGS) ×1
DERMABOND ADVANCED .7 DNX12 (GAUZE/BANDAGES/DRESSINGS) ×1 IMPLANT
ELECT REM PT RETURN 9FT ADLT (ELECTROSURGICAL) ×2
ELECTRODE REM PT RTRN 9FT ADLT (ELECTROSURGICAL) ×1 IMPLANT
GLOVE BIO SURGEON STRL SZ7.5 (GLOVE) ×2 IMPLANT
GOWN STRL REUS W/ TWL LRG LVL3 (GOWN DISPOSABLE) ×2 IMPLANT
GOWN STRL REUS W/ TWL XL LVL3 (GOWN DISPOSABLE) ×1 IMPLANT
GOWN STRL REUS W/TWL LRG LVL3 (GOWN DISPOSABLE) ×2
GOWN STRL REUS W/TWL XL LVL3 (GOWN DISPOSABLE) ×1
INSERT FOGARTY SM (MISCELLANEOUS) ×2 IMPLANT
KIT BASIN OR (CUSTOM PROCEDURE TRAY) ×2 IMPLANT
KIT TURNOVER KIT B (KITS) ×2 IMPLANT
NS IRRIG 1000ML POUR BTL (IV SOLUTION) ×2 IMPLANT
PACK CV ACCESS (CUSTOM PROCEDURE TRAY) ×2 IMPLANT
PAD ARMBOARD 7.5X6 YLW CONV (MISCELLANEOUS) ×4 IMPLANT
SUT MNCRL AB 4-0 PS2 18 (SUTURE) ×2 IMPLANT
SUT PROLENE 6 0 BV (SUTURE) ×2 IMPLANT
SUT VIC AB 3-0 SH 27 (SUTURE) ×1
SUT VIC AB 3-0 SH 27X BRD (SUTURE) ×1 IMPLANT
SYR CONTROL 10ML LL (SYRINGE) ×2 IMPLANT
TOWEL GREEN STERILE (TOWEL DISPOSABLE) ×2 IMPLANT
UNDERPAD 30X36 HEAVY ABSORB (UNDERPADS AND DIAPERS) ×2 IMPLANT
WATER STERILE IRR 1000ML POUR (IV SOLUTION) ×2 IMPLANT

## 2020-07-29 NOTE — Anesthesia Postprocedure Evaluation (Signed)
Anesthesia Post Note  Patient: Jerry [name]  Procedure(s) Performed: LEFT ARM BRACHIOCEPHALIC ARTERIOVENOUS (AV) FISTULA CREATION (Left Arm Upper)     Patient location during evaluation: PACU Anesthesia Type: Regional Level of consciousness: awake and alert, patient cooperative and oriented Pain management: pain level controlled Vital Signs Assessment: post-procedure vital signs reviewed and stable Respiratory status: spontaneous breathing, nonlabored ventilation and respiratory function stable Postop Assessment: no apparent nausea or vomiting and able to ambulate Anesthetic complications: no   No complications documented.  Last Vitals:  Vitals:   07/29/20 0855 07/29/20 0910  BP: (!) 116/93 (!) 143/98  Pulse: 81 83  Resp: 12 16  Temp: 36.5 C   SpO2: 98% 98%    Last Pain:  Vitals:   07/29/20 0552  TempSrc: Oral                 [name],E. Fusaye Wachtel

## 2020-07-29 NOTE — Anesthesia Procedure Notes (Signed)
Procedure Name: MAC Date/Time: 07/29/2020 7:35 AM Performed by: Harden Mo, CRNA Pre-anesthesia Checklist: Patient identified, Emergency Drugs available, Suction available and Patient being monitored Patient Re-evaluated:Patient Re-evaluated prior to induction Oxygen Delivery Method: Simple face mask Preoxygenation: Pre-oxygenation with 100% oxygen Induction Type: IV induction Placement Confirmation: positive ETCO2 and breath sounds checked- equal and bilateral Dental Injury: Teeth and Oropharynx as per pre-operative assessment

## 2020-07-29 NOTE — Op Note (Signed)
    Patient name: Jerry [name] MRN: [id number] DOB: 1970-11-30 Sex: male  07/29/2020 Pre-operative Diagnosis: End-stage renal disease Post-operative diagnosis:  Same Surgeon:  Erlene Quan C. Donzetta Matters, MD Assistant: Laurence Slate, PA Procedure Performed:  Left arm brachial artery to cephalic vein AV fistula creation  Indications: 50 year old male with history of end-stage renal disease with failed right upper extremity access.  He currently dialyzes via catheter.  He is indicated for left arm access.  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  Assistant was necessary to facilitate exposure and expedite the case.  Findings: The left cephalic vein easily dilated to approximately 3-1/2 mm.  The brachial artery likely had a high bifurcation we anastomose more likely to the radial artery possibly the brachial artery just below the antecubitum.  At completion there was a strong thrill confirmed with Doppler and a palpable radial artery pulse the wrist also confirmed with Doppler.   Procedure:  The patient was identified in the holding area and taken to the operating was placed supine on the operative table.  Preoperative block of been placed.  Timeout was called he was sterilely prepped and draped in the left upper extremity and antibiotics were administered.  The block was checked he did have some sensation we did anesthetize the arm locally with 1% lidocaine.  Transverse incision was made after we evaluated the vein with ultrasound.  We dissected down to the vein marked if orientation.  We dissected through the deep fascia to the brachial artery.  There were many connecting veins which were divided between clips and ties.  We placed a vessel loop around the artery.  The artery was somewhat diminutive possibly was a higher bifurcation to the radial artery was in the typical location of the brachial artery although I did not identify the ulnar artery at that location.  We then transected  the vein distally tied off.  We flushed with heparinized saline and spatulated.  It did dilate nicely.  I then clamped the artery distally proximally opened longitudinally and flushed with heparinized saline in both directions.  We then sewed the vein end-to-side with 6-0 Prolene suture.  Prior completion without flushing all directions.  Upon completion there was a strong thrill in the vein confirmed with Doppler.  There is a palpable radial artery pulse the wrist confirmed with Doppler.  We irrigated the wound and obtain hemostasis and closed in layers with Vicryl and Monocryl.  Dermabond was placed at the skin level.  He was awakened from anesthesia having tolerated procedure without any complication.  Counts were correct at completion.  EBL: 20 cc   Matasha Smigelski C. Donzetta Matters, MD [facility] Office: [phone] Pager: [phone]

## 2020-07-29 NOTE — Anesthesia Procedure Notes (Signed)
Anesthesia Regional Block: Supraclavicular block   Pre-Anesthetic Checklist: ,, timeout performed, Correct Patient, Correct Site, Correct Laterality, Correct Procedure, Correct Position, site marked, Risks and benefits discussed,  Surgical consent,  Pre-op evaluation,  At surgeon's request and post-op pain management  Laterality: Left and Upper  Prep: Dura Prep       Needles:  Injection technique: Single-shot  Needle Type: Echogenic Needle     Needle Length: 9cm  Needle Gauge: 21     Additional Needles:   Procedures:,,,, ultrasound used (permanent image in chart),,,,  Narrative:  Start time: 07/29/2020 7:05 AM End time: 07/29/2020 7:12 AM Injection made incrementally with aspirations every 5 mL.  Performed by: Personally  Anesthesiologist: Annye Asa, MD  Additional Notes: Pt identified in Holding room.  Monitors applied. Working IV access confirmed. Sterile prep L clavicle and neck.  #21ga ECHOgenic Arrow needle to supraclavicular brachial plexus with US guidance.  30cc 1.5% Lidocaine with 1:200k epi injected incrementally after negative test dose.  Patient asymptomatic, VSS, no heme aspirated, tolerated well.  Jenita Seashore, MD

## 2020-07-29 NOTE — Transfer of Care (Signed)
Immediate Anesthesia Transfer of Care Note  Patient: Jerry [name]  Procedure(s) Performed: LEFT ARM BRACHIOCEPHALIC ARTERIOVENOUS (AV) FISTULA CREATION (Left Arm Upper)  Patient Location: PACU  Anesthesia Type:MAC and Regional  Level of Consciousness: awake, alert  and oriented  Airway & Oxygen Therapy: Patient Spontanous Breathing  Post-op Assessment: Report given to RN and Post -op Vital signs reviewed and stable  Post vital signs: Reviewed and stable  Last Vitals:  Vitals Value Taken Time  BP    Temp    Pulse 85 07/29/20 0852  Resp 17 07/29/20 0852  SpO2 98 % 07/29/20 0852  Vitals shown include unvalidated device data.  Last Pain:  Vitals:   07/29/20 0552  TempSrc: Oral         Complications: No complications documented.

## 2020-07-29 NOTE — Discharge Instructions (Signed)
° °  [facility] ° °Discharge Instructions ° °AV Fistula or Graft Surgery for Dialysis Access ° °Please refer to the following instructions for your post-procedure care. Your surgeon or physician assistant will discuss any changes with you. ° °Activity ° °You may drive the day following your surgery, if you are comfortable and no longer taking prescription pain medication. Resume full activity as the soreness in your incision resolves. ° °Bathing/Showering ° °You may shower after you go home. Keep your incision dry for 48 hours. Do not soak in a bathtub, hot tub, or swim until the incision heals completely. You may not shower if you have a hemodialysis catheter. ° °Incision Care ° °Clean your incision with mild soap and water after 48 hours. Pat the area dry with a clean towel. You do not need a bandage unless otherwise instructed. Do not apply any ointments or creams to your incision. You may have skin glue on your incision. Do not peel it off. It will come off on its own in about one week. Your arm may swell a bit after surgery. To reduce swelling use pillows to elevate your arm so it is above your heart. Your doctor will tell you if you need to lightly wrap your arm with an ACE bandage. ° °Diet ° °Resume your normal diet. There are not special food restrictions following this procedure. In order to heal from your surgery, it is CRITICAL to get adequate nutrition. Your body requires vitamins, minerals, and protein. Vegetables are the best source of vitamins and minerals. Vegetables also provide the perfect balance of protein. Processed food has little nutritional value, so try to avoid this. ° °Medications ° °Resume taking all of your medications. If your incision is causing pain, you may take over-the counter pain relievers such as acetaminophen (Tylenol). If you were prescribed a stronger pain medication, please be aware these medications can cause nausea and constipation. Prevent  nausea by taking the medication with a snack or meal. Avoid constipation by drinking plenty of fluids and eating foods with high amount of fiber, such as fruits, vegetables, and grains. Do not take Tylenol if you are taking prescription pain medications. ° ° ° ° °Follow up °Your surgeon may want to see you in the office following your access surgery. If so, this will be arranged at the time of your surgery. ° °Please call us immediately for any of the following conditions: ° °Increased pain, redness, drainage (pus) from your incision site °Fever of 101 degrees or higher °Severe or worsening pain at your incision site °Hand pain or numbness. ° °Reduce your risk of vascular disease: ° °Stop smoking. If you would like help, call [facility] at 1-800-QUIT-NOW (1-800-784-8669) or [facility] at 336-586-4000 ° °Manage your cholesterol °Maintain a desired weight °Control your diabetes °Keep your blood pressure down ° °Dialysis ° °It will take several weeks to several months for your new dialysis access to be ready for use. Your surgeon will determine when it is OK to use it. Your nephrologist will continue to direct your dialysis. You can continue to use your Permcath until your new access is ready for use. ° °If you have any questions, please call the office at [phone]. ° °

## 2020-07-29 NOTE — H&P (Signed)
H+P  HPI:  [name] who is s/p revision of right arm AVF with primary repair of ulcerated vein and primary closure on 05/23/2020 by Dr. Donzetta Matters.   Post extubation, pt became agitated, hypoxemic, and bradycardic and required re-intubation.  He was extubated the following day.  He did not have a palpable thrill in the right AVF and scheduled to f/u in 2-3 weeks with new BUE vein mapping and discuss new access. He is here today for that appt.   Access history: He was originally seen by [facility] in the ER in June 2021 for bleeding from his fistula.  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   -July 2021,-TDC placed by Dr. Carlis Abbott.  -August 2021 - fistulogram with venoplasty of cephalic vein (right) by Dr. Trula Slade -November 2021 - revision of right arm AVF by Dr. Donzetta Matters with subsequent clotted fistula   The pt is on dialysis M/W/F at [facility] location. His wife states they were told that he could not get a right upper arm fistula due to narrowing in the chest.  Fistulogram on 1/0/1751 revealed cephalic vein fistula is patent throughout the arm however there are numerous branches and several areas of greater than 70% stenosis. 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  The pt is ambidextrous but writes with his left hand.    No Known Allergies        Current Outpatient Medications  Medication Sig Dispense Refill  . acetaminophen (TYLENOL) 500 MG tablet Take 1,000 mg by mouth every 6 (six) hours as needed for mild pain.    . calcium acetate (PHOSLO) 667 MG capsule Take 2,001 mg by mouth with breakfast, with lunch, and with evening meal. 667 mg with snacks    . losartan (COZAAR) 25 MG tablet Take 25 mg by mouth at bedtime.    . multivitamin (RENA-VIT) TABS tablet Take 1 tablet by mouth daily.    Marland Kitchen  oxyCODONE-acetaminophen (PERCOCET/ROXICET) 5-325 MG tablet Take 1 tablet by mouth every 6 (six) hours as needed for moderate pain. 8 tablet 0            Current Facility-Administered Medications  Medication Dose Route Frequency Provider Last Rate Last Admin  . 0.9 %  sodium chloride infusion  250 mL Intravenous PRN Serafina Mitchell, MD         ROS:  See HPI  Physical Exam:  Vitals:   07/29/20 0552 07/29/20 0601  BP: (!) 166/114 (!) 150/105  Pulse: 98 90  Resp: 18   Temp: (!) 97.5 F (36.4 C)   SpO2: 99%       Incision:  Nylon sutures in place Extremities:   +palpable left radial pulse There is a well healed incision on the lateral aspect of the arm just distal to the antecubital space, where parathyroid was reimplanted     Upper extremity vein mapping for Dialysis access on 06/18/2020: +-----------------+-------------+----------+---------+  Left Cephalic  Diameter (cm)Depth (cm)Findings   +-----------------+-------------+----------+---------+  Shoulder       0.34               +-----------------+-------------+----------+---------+  Prox upper arm    0.37               +-----------------+-------------+----------+---------+  Mid upper arm  0.36               +-----------------+-------------+----------+---------+  Dist upper arm    0.33               +-----------------+-------------+----------+---------+  Antecubital fossa  0.28               +-----------------+-------------+----------+---------+  Prox forearm     0.30               +-----------------+-------------+----------+---------+  Mid forearm     0.35        branching  +-----------------+-------------+----------+---------+  Dist forearm     0.32        branching  +-----------------+-------------+----------+---------+    +-----------------+-------------+----------+--------+  Left Basilic   Diameter (cm)Depth (cm)Findings  +-----------------+-------------+----------+--------+  Mid upper arm    0.42              +-----------------+-------------+----------+--------+  Dist upper arm    0.39              +-----------------+-------------+----------+--------+  Antecubital fossa  0.41              +-----------------+-------------+----------+--------+  Prox forearm     0.36              +-----------------+-------------+----------+--------+   Arterial duplex upper extremity 06/18/2020: Location        PSV (cm/s)Intralum. Diam. (cm)Waveform  Comments  +-----------------------+----------+--------------------+---------+--------  Brachial Antecub. fossa93    0.52        triphasic      +-----------------------+----------+--------------------+---------+--------  Radial Art at Wrist  71    0.32        triphasic      +-----------------------+----------+--------------------+---------+--------  Ulnar Art at Wrist   55    0.26        triphasic      +-----------------------+----------+--------------------+---------+--------    Assessment/Plan:  [name] who has ESRD and is in need on new dialysis access. Plan left arm avf vs avg today in OR.    Lyna Laningham C. Donzetta Matters, MD [facility] [place] Office: [phone] Pager: [phone]

## 2020-07-30 ENCOUNTER — Encounter (HOSPITAL_COMMUNITY): Payer: Self-pay | Admitting: Vascular Surgery

## 2020-08-12 ENCOUNTER — Other Ambulatory Visit: Payer: Self-pay

## 2020-08-12 DIAGNOSIS — N186 End stage renal disease: Secondary | ICD-10-CM

## 2020-08-12 DIAGNOSIS — Z992 Dependence on renal dialysis: Secondary | ICD-10-CM

## 2020-09-03 ENCOUNTER — Other Ambulatory Visit: Payer: Self-pay

## 2020-09-03 ENCOUNTER — Ambulatory Visit (HOSPITAL_COMMUNITY)
Admission: RE | Admit: 2020-09-03 | Discharge: 2020-09-03 | Disposition: A | Discharge status: Home / Self Care | Payer: Medicare (Managed Care) | Source: Ambulatory Visit | Attending: Vascular Surgery | Admitting: Vascular Surgery

## 2020-09-03 ENCOUNTER — Ambulatory Visit (INDEPENDENT_AMBULATORY_CARE_PROVIDER_SITE_OTHER): Payer: Medicare (Managed Care) | Admitting: Physician Assistant

## 2020-09-03 VITALS — BP 139/100 | HR 101 | Temp 98.2°F | Resp 18 | Ht 68.0 in | Wt 244.7 lb

## 2020-09-03 DIAGNOSIS — Z992 Dependence on renal dialysis: Secondary | ICD-10-CM | POA: Insufficient documentation

## 2020-09-03 DIAGNOSIS — N186 End stage renal disease: Secondary | ICD-10-CM | POA: Insufficient documentation

## 2020-09-03 NOTE — Progress Notes (Signed)
  POST OPERATIVE DIALYSIS ACCESS OFFICE NOTE    CC:  F/u for dialysis access surgery  HPI:  [name] who is s/p left brachiocephalic AVF on [id number] by Dr. Donzetta Matters. Denies hand pain or numbness.  Complains of mild left upper extremity edema but he says this is improving.  Dialyzing via [facility] without complications. No fever or chills.  Dialysis days:  MWF   Dialysis center:  [facility]  Allergies  Allergen Reactions  . Chlorhexidine Itching    Burns skin    Current Outpatient Medications  Medication Sig Dispense Refill  . acetaminophen (TYLENOL) 500 MG tablet Take 1,000 mg by mouth every 6 (six) hours as needed for mild pain.    . calcium acetate (PHOSLO) 667 MG capsule Take 2,001 mg by mouth with breakfast, with lunch, and with evening meal. 667 mg with snacks    . losartan (COZAAR) 25 MG tablet Take 25 mg by mouth at bedtime.    . Methoxy PEG-Epoetin Beta (MIRCERA IJ) Mircera    . multivitamin (RENA-VIT) TABS tablet Take 1 tablet by mouth daily.    Marland Kitchen oxyCODONE-acetaminophen (PERCOCET/ROXICET) 5-325 MG tablet Take 1 tablet by mouth every 6 (six) hours as needed for moderate pain. 10 tablet 0   Current Facility-Administered Medications  Medication Dose Route Frequency Provider Last Rate Last Admin  . 0.9 %  sodium chloride infusion  250 mL Intravenous PRN Serafina Mitchell, MD         ROS:  See HPI Vitals:   09/03/20 1255  BP: (!) 139/100  Pulse: (!) 101  Resp: 18  Temp: 98.2 F (36.8 C)  SpO2: 98%    Physical Exam:  General appearance: Well-developed, well-nourished in no apparent distress Cardiac: Heart rate is slightly elevated, rhythm is regular Respiratory: Unlabored Incision: Left AC incision well-healed Extremities: Left upper extremity: There is a good thrill and bruit in the fistula.  There is a 2+ radial pulse.  Hand grip strength is 5/5 with intact sensation  Dialysis duplex on 09/03/2020 Depth of the fistula ranges from 0.62 to 1.16  cm.  Diameter ranges from 0.40 to 0.49 cm.  There are multiple competing branches.  Assessment/Plan:   -pt does not have evidence of steal syndrome -The fistula has not yet matured enough for access.  I discussed using exercise ball on a daily basis.  I explained we would recheck his duplex study in 1 month and decide at that time whether he would require superficialization and ligation of competing branches.   Barbie Banner, PA-C 09/03/2020 12:58 PM [facility] [phone]  Clinic MD: Donzetta Matters

## 2020-09-09 ENCOUNTER — Other Ambulatory Visit: Payer: Self-pay | Admitting: *Deleted

## 2020-09-09 DIAGNOSIS — N186 End stage renal disease: Secondary | ICD-10-CM

## 2020-10-08 ENCOUNTER — Ambulatory Visit: Payer: Medicare (Managed Care)

## 2020-10-08 ENCOUNTER — Ambulatory Visit (HOSPITAL_COMMUNITY): Payer: Medicare (Managed Care) | Attending: Vascular Surgery

## 2020-10-12 ENCOUNTER — Encounter: Payer: Self-pay | Admitting: Physician Assistant

## 2020-10-12 ENCOUNTER — Ambulatory Visit: Payer: Medicare (Managed Care) | Admitting: Physician Assistant

## 2020-10-12 ENCOUNTER — Ambulatory Visit (HOSPITAL_COMMUNITY)
Admission: RE | Admit: 2020-10-12 | Discharge: 2020-10-12 | Disposition: A | Discharge status: Home / Self Care | Payer: Medicare (Managed Care) | Source: Ambulatory Visit | Attending: Vascular Surgery | Admitting: Vascular Surgery

## 2020-10-12 ENCOUNTER — Other Ambulatory Visit: Payer: Self-pay

## 2020-10-12 VITALS — BP 167/104 | HR 97 | Temp 98.3°F | Resp 20 | Ht 68.0 in | Wt 253.8 lb

## 2020-10-12 DIAGNOSIS — Z992 Dependence on renal dialysis: Secondary | ICD-10-CM

## 2020-10-12 DIAGNOSIS — N186 End stage renal disease: Secondary | ICD-10-CM | POA: Insufficient documentation

## 2020-10-12 NOTE — Progress Notes (Signed)
Postoperative Access Visit   History of Present Illness   Jerry [name] is a 50 y.o. year old male who presents for postoperative follow-up for Left arm brachial artery to cephalic vein AV fistula creation by Dr. Donzetta Matters on 07/29/20. The patient's wounds are well healed.  The patient notes no steal symptoms.  He was seen last month and at the time his duplex showed that the fistula was not matured enough and there were some competing branches present. He was encouraged to exercise his left upper extremity and return for follow up in 1 month. He explains that he has been doing exercises with is left arm but admits that it has not been daily.    He currently is dialyzing on MWF via [facility]:   10/12/20 0949  BP: (!) 167/104  Pulse: 97  Resp: 20  Temp: 98.3 F (36.8 C)  TempSrc: Temporal  SpO2: 96%  Weight: 253 lb 12.8 oz (115.1 kg)  Height: '5\' 8"'$  (1.727 m)   Body mass index is 38.59 kg/m.  left arm Incision is well healed, 2+ radial pulse, hand grip is 5/5, sensation in digits is  intact, palpable thrill, bruit can  be auscultated    Non invasive vascular lab: Findings:  +--------------------+----------+-----------------+--------+  AVF         PSV (cm/s)Flow Vol (mL/min)Comments  +--------------------+----------+-----------------+--------+  Native artery inflow  118     1172          +--------------------+----------+-----------------+--------+  AVF Anastomosis     426                 +--------------------+----------+-----------------+--------+     +------------+----------+-------------+----------+---------------------+  OUTFLOW VEINPSV (cm/s)Diameter (cm)Depth (cm)   Describe      +------------+----------+-------------+----------+---------------------+  Shoulder    91    0.60     1.92                +------------+----------+-------------+----------+---------------------+  Prox UA     138    0.44     0.71  competing branch 0.30  +------------+----------+-------------+----------+---------------------+  Mid UA     130    0.44     0.53  competing branch 0.24  +------------+----------+-------------+----------+---------------------+  Dist UA     312    0.40     0.61               +------------+----------+-------------+----------+---------------------+  AC Fossa  327 / 492   0.42     0.63               +------------+----------+-------------+----------+---------------------+   Medical Decision Making    Jerry [name] is a 50 y.o. year old male who presents s/p Left arm brachial artery to cephalic vein AV fistula creation by Dr. Donzetta Matters on 07/29/20.  Fistula duplex again today shows adequate volume flow, and adequate depth but fistula diameter has still not fully matured. The competing branches still present. He was seen one month ago and explained to him to use exercise ball and if no improvement would need possible ligation of competing branches. With no improvement in maturation of fistula I have recommended superficialization and ligation of competing branches. Patient is agreeable to proceed with scheduling. I will schedule him with Dr. Donzetta Matters for next availability. He dialyzes MWF so explained that we will try to arrange this on non dialysis day but may need to change dialysis to accommodate surgery.    Karoline Caldwell, PA-C [facility] [place] Office: [phone]  Clinic MD: Roxanne Mins

## 2020-10-12 NOTE — H&P (View-Only) (Signed)
Postoperative Access Visit   History of Present Illness   Jerry [name] is a 50 y.o. year old male who presents for postoperative follow-up for Left arm brachial artery to cephalic vein AV fistula creation by Dr. Donzetta Matters on 07/29/20. The patient's wounds are well healed.  The patient notes no steal symptoms.  He was seen last month and at the time his duplex showed that the fistula was not matured enough and there were some competing branches present. He was encouraged to exercise his left upper extremity and return for follow up in 1 month. He explains that he has been doing exercises with is left arm but admits that it has not been daily.    He currently is dialyzing on MWF via [facility] [place]:   10/12/20 0949  BP: (!) 167/104  Pulse: 97  Resp: 20  Temp: 98.3 F (36.8 C)  TempSrc: Temporal  SpO2: 96%  Weight: 253 lb 12.8 oz (115.1 kg)  Height: '5\' 8"'$  (1.727 m)   Body mass index is 38.59 kg/m.  left arm Incision is well healed, 2+ radial pulse, hand grip is 5/5, sensation in digits is  intact, palpable thrill, bruit can  be auscultated    Non invasive vascular lab: Findings:  +--------------------+----------+-----------------+--------+  AVF         PSV (cm/s)Flow Vol (mL/min)Comments  +--------------------+----------+-----------------+--------+  Native artery inflow  118     1172          +--------------------+----------+-----------------+--------+  AVF Anastomosis     426                 +--------------------+----------+-----------------+--------+     +------------+----------+-------------+----------+---------------------+  OUTFLOW VEINPSV (cm/s)Diameter (cm)Depth (cm)   Describe      +------------+----------+-------------+----------+---------------------+  Shoulder    91    0.60     1.92                +------------+----------+-------------+----------+---------------------+  Prox UA     138    0.44     0.71  competing branch 0.30  +------------+----------+-------------+----------+---------------------+  Mid UA     130    0.44     0.53  competing branch 0.24  +------------+----------+-------------+----------+---------------------+  Dist UA     312    0.40     0.61               +------------+----------+-------------+----------+---------------------+  AC Fossa  327 / 492   0.42     0.63               +------------+----------+-------------+----------+---------------------+   Medical Decision Making    Jerry [name] is a 50 y.o. year old male who presents s/p Left arm brachial artery to cephalic vein AV fistula creation by Dr. Donzetta Matters on 07/29/20.  Fistula duplex again today shows adequate volume flow, and adequate depth but fistula diameter has still not fully matured. The competing branches still present. He was seen one month ago and explained to him to use exercise ball and if no improvement would need possible ligation of competing branches. With no improvement in maturation of fistula I have recommended superficialization and ligation of competing branches. Patient is agreeable to proceed with scheduling. I will schedule him with Dr. Donzetta Matters for next availability. He dialyzes MWF so explained that we will try to arrange this on non dialysis day but may need to change dialysis to accommodate surgery.    Karoline Caldwell, PA-C [facility] [place] Office: [phone]  Clinic MD: Roxanne Mins

## 2020-10-20 ENCOUNTER — Other Ambulatory Visit: Payer: Self-pay

## 2020-11-08 ENCOUNTER — Other Ambulatory Visit (HOSPITAL_COMMUNITY)
Admission: RE | Admit: 2020-11-08 | Discharge: 2020-11-08 | Disposition: A | Discharge status: Home / Self Care | Payer: Medicare (Managed Care) | Source: Ambulatory Visit | Attending: Vascular Surgery | Admitting: Vascular Surgery

## 2020-11-08 DIAGNOSIS — Z01812 Encounter for preprocedural laboratory examination: Secondary | ICD-10-CM | POA: Insufficient documentation

## 2020-11-08 DIAGNOSIS — Z20822 Contact with and (suspected) exposure to covid-19: Secondary | ICD-10-CM | POA: Insufficient documentation

## 2020-11-08 NOTE — Progress Notes (Signed)
Unable to reach patient.  Left message on mobile with arrival time and detailed instructios.  EKG 07/24/19, Echo 01/30/18.

## 2020-11-08 NOTE — Anesthesia Preprocedure Evaluation (Addendum)
Anesthesia Evaluation  Patient identified by MRN, date of birth, ID band Patient awake    Reviewed: Allergy & Precautions, NPO status , Patient's Chart, lab work & pertinent test results  Airway Mallampati: III  TM Distance: >3 FB Neck ROM: Full    Dental  (+) Teeth Intact   Pulmonary sleep apnea and Continuous Positive Airway Pressure Ventilation , Current Smoker, former smoker,    Pulmonary exam normal        Cardiovascular hypertension, Pt. on medications  Rhythm:Regular Rate:Normal     Neuro/Psych  Headaches, negative psych ROS   GI/Hepatic negative GI ROS, Neg liver ROS,   Endo/Other  negative endocrine ROS  Renal/GU Dialysis and ESRFRenal disease  negative genitourinary   Musculoskeletal negative musculoskeletal ROS (+)   Abdominal (+) + obese,  Abdomen: soft. Bowel sounds: normal.  Peds  Hematology  (+) anemia ,   Anesthesia Other Findings   Reproductive/Obstetrics                            Anesthesia Physical Anesthesia Plan  ASA: III  Anesthesia Plan: General   Post-op Pain Management:    Induction: Intravenous  PONV Risk Score and Plan: 1 and Ondansetron, Dexamethasone, Midazolam and Treatment may vary due to age or medical condition  Airway Management Planned: Mask and LMA  Additional Equipment: None  Intra-op Plan:   Post-operative Plan: Extubation in OR  Informed Consent: I have reviewed the patients History and Physical, chart, labs and discussed the procedure including the risks, benefits and alternatives for the proposed anesthesia with the patient or authorized representative who has indicated his/her understanding and acceptance.     Dental advisory given  Plan Discussed with: CRNA  Anesthesia Plan Comments: (Lab Results      Component                Value               Date                      WBC                      9.5                 05/23/2020                 HGB                      11.9 (L)            11/09/2020                HCT                      35.0 (L)            11/09/2020                MCV                      92.9                05/23/2020                PLT                      316  05/23/2020           Lab Results      Component                Value               Date                      NA                       139                 11/09/2020                K                        5.0                 11/09/2020                CO2                      20 (L)              05/23/2020                GLUCOSE                  91                  11/09/2020                BUN                      51 (H)              11/09/2020                CREATININE               11.80 (H)           11/09/2020                CALCIUM                  7.2 (L)             05/23/2020                GFRNONAA                 4 (L)               05/23/2020                GFRAA                    5 (L)               02/04/2020          )       Anesthesia Quick Evaluation

## 2020-11-09 ENCOUNTER — Ambulatory Visit (HOSPITAL_COMMUNITY): Payer: Medicare (Managed Care) | Admitting: Certified Registered"

## 2020-11-09 ENCOUNTER — Encounter (HOSPITAL_COMMUNITY)
Admission: RE | Disposition: A | Discharge status: Home / Self Care | Payer: Self-pay | Source: Ambulatory Visit | Attending: Vascular Surgery

## 2020-11-09 ENCOUNTER — Other Ambulatory Visit: Payer: Self-pay

## 2020-11-09 ENCOUNTER — Encounter (HOSPITAL_COMMUNITY): Payer: Self-pay | Admitting: Vascular Surgery

## 2020-11-09 ENCOUNTER — Ambulatory Visit (HOSPITAL_COMMUNITY)
Admission: RE | Admit: 2020-11-09 | Discharge: 2020-11-09 | Disposition: A | Discharge status: Home / Self Care | Payer: Medicare (Managed Care) | Source: Ambulatory Visit | Attending: Vascular Surgery | Admitting: Vascular Surgery

## 2020-11-09 DIAGNOSIS — Z992 Dependence on renal dialysis: Secondary | ICD-10-CM | POA: Diagnosis not present

## 2020-11-09 DIAGNOSIS — N185 Chronic kidney disease, stage 5: Secondary | ICD-10-CM | POA: Diagnosis not present

## 2020-11-09 DIAGNOSIS — T82898A Other specified complication of vascular prosthetic devices, implants and grafts, initial encounter: Secondary | ICD-10-CM | POA: Diagnosis not present

## 2020-11-09 DIAGNOSIS — N186 End stage renal disease: Secondary | ICD-10-CM | POA: Diagnosis not present

## 2020-11-09 HISTORY — PX: LIGATION OF COMPETING BRANCHES OF ARTERIOVENOUS FISTULA: SHX5949

## 2020-11-09 HISTORY — DX: Gastro-esophageal reflux disease without esophagitis: K21.9

## 2020-11-09 HISTORY — PX: BASCILIC VEIN TRANSPOSITION: SHX5742

## 2020-11-09 LAB — POCT I-STAT, CHEM 8
BUN: 51 mg/dL — ABNORMAL HIGH (ref 6–20)
Calcium, Ion: 0.88 mmol/L — CL (ref 1.15–1.40)
Chloride: 104 mmol/L (ref 98–111)
Creatinine, Ser: 11.8 mg/dL — ABNORMAL HIGH (ref 0.61–1.24)
Glucose, Bld: 91 mg/dL (ref 70–99)
HCT: 35 % — ABNORMAL LOW (ref 39.0–52.0)
Hemoglobin: 11.9 g/dL — ABNORMAL LOW (ref 13.0–17.0)
Potassium: 5 mmol/L (ref 3.5–5.1)
Sodium: 139 mmol/L (ref 135–145)
TCO2: 25 mmol/L (ref 22–32)

## 2020-11-09 LAB — SARS CORONAVIRUS 2 (TAT 6-24 HRS): SARS Coronavirus 2: NEGATIVE

## 2020-11-09 SURGERY — LIGATION OF COMPETING BRANCHES OF ARTERIOVENOUS FISTULA
Anesthesia: General | Site: Arm Upper | Laterality: Left

## 2020-11-09 MED ORDER — ONDANSETRON HCL 4 MG/2ML IJ SOLN
INTRAMUSCULAR | Status: DC | PRN
  Administered 2020-11-09: 4 mg via INTRAVENOUS

## 2020-11-09 MED ORDER — DEXAMETHASONE SODIUM PHOSPHATE 10 MG/ML IJ SOLN
INTRAMUSCULAR | Status: DC | PRN
  Administered 2020-11-09: 5 mg

## 2020-11-09 MED ORDER — ACETAMINOPHEN 10 MG/ML IV SOLN: 1000.0000 mg | Freq: Once | INTRAVENOUS | Status: DC | PRN

## 2020-11-09 MED ORDER — FENTANYL CITRATE (PF) 250 MCG/5ML IJ SOLN
INTRAMUSCULAR | Status: AC
  Filled 2020-11-09: qty 5

## 2020-11-09 MED ORDER — ORAL CARE MOUTH RINSE: 15.0000 mL | Freq: Once | OROMUCOSAL | Status: DC

## 2020-11-09 MED ORDER — PROPOFOL 10 MG/ML IV BOLUS
INTRAVENOUS | Status: AC
  Filled 2020-11-09: qty 20

## 2020-11-09 MED ORDER — HYDRALAZINE HCL 20 MG/ML IJ SOLN
INTRAMUSCULAR | Status: AC
  Filled 2020-11-09: qty 1

## 2020-11-09 MED ORDER — FENTANYL CITRATE (PF) 100 MCG/2ML IJ SOLN: 25.0000 ug | INTRAMUSCULAR | Status: DC | PRN

## 2020-11-09 MED ORDER — ROPIVACAINE HCL 5 MG/ML IJ SOLN
INTRAMUSCULAR | Status: DC | PRN
  Administered 2020-11-09: 30 mL via PERINEURAL

## 2020-11-09 MED ORDER — HYDRALAZINE HCL 20 MG/ML IJ SOLN
10.0000 mg | Freq: Once | INTRAMUSCULAR | Status: AC
  Administered 2020-11-09: 10 mg via INTRAVENOUS

## 2020-11-09 MED ORDER — MIDAZOLAM HCL 5 MG/5ML IJ SOLN
INTRAMUSCULAR | Status: DC | PRN
  Administered 2020-11-09 (×2): 1 mg via INTRAVENOUS

## 2020-11-09 MED ORDER — PROPOFOL 10 MG/ML IV BOLUS
INTRAVENOUS | Status: DC | PRN
  Administered 2020-11-09: 25 mg via INTRAVENOUS

## 2020-11-09 MED ORDER — 0.9 % SODIUM CHLORIDE (POUR BTL) OPTIME
TOPICAL | Status: DC | PRN
  Administered 2020-11-09: 1000 mL

## 2020-11-09 MED ORDER — OXYCODONE HCL 5 MG/5ML PO SOLN
5.0000 mg | Freq: Once | ORAL | Status: DC | PRN
Start: 2020-11-09 — End: 2020-11-11

## 2020-11-09 MED ORDER — CEFAZOLIN SODIUM-DEXTROSE 2-4 GM/100ML-% IV SOLN
INTRAVENOUS | Status: AC
  Filled 2020-11-09: qty 100

## 2020-11-09 MED ORDER — PROMETHAZINE HCL 25 MG/ML IJ SOLN: 6.2500 mg | INTRAMUSCULAR | Status: DC | PRN

## 2020-11-09 MED ORDER — SODIUM CHLORIDE 0.9 % IV SOLN: INTRAVENOUS | Status: DC

## 2020-11-09 MED ORDER — SODIUM CHLORIDE 0.9 % IV SOLN
INTRAVENOUS | Status: DC | PRN
  Administered 2020-11-09: 500 mL

## 2020-11-09 MED ORDER — OXYCODONE-ACETAMINOPHEN 10-325 MG PO TABS: 1.0000 | ORAL_TABLET | Freq: Four times a day (QID) | ORAL | 0 refills | Status: DC | PRN

## 2020-11-09 MED ORDER — SODIUM CHLORIDE 0.9 % IV SOLN
INTRAVENOUS | Status: AC
  Filled 2020-11-09: qty 1.2

## 2020-11-09 MED ORDER — FENTANYL CITRATE (PF) 250 MCG/5ML IJ SOLN
INTRAMUSCULAR | Status: DC | PRN
  Administered 2020-11-09 (×2): 25 ug via INTRAVENOUS
  Administered 2020-11-09: 50 ug via INTRAVENOUS

## 2020-11-09 MED ORDER — CHLORHEXIDINE GLUCONATE 0.12 % MT SOLN: 15.0000 mL | Freq: Once | OROMUCOSAL | Status: DC

## 2020-11-09 MED ORDER — DEXMEDETOMIDINE (PRECEDEX) IN NS 20 MCG/5ML (4 MCG/ML) IV SYRINGE
PREFILLED_SYRINGE | INTRAVENOUS | Status: DC | PRN
  Administered 2020-11-09: 8 ug via INTRAVENOUS
  Administered 2020-11-09: 4 ug via INTRAVENOUS
  Administered 2020-11-09: 8 ug via INTRAVENOUS

## 2020-11-09 MED ORDER — SODIUM CHLORIDE 0.9 % IV SOLN: INTRAVENOUS | Status: DC | PRN

## 2020-11-09 MED ORDER — OXYCODONE HCL 5 MG PO TABS: 5.0000 mg | ORAL_TABLET | Freq: Once | ORAL | Status: DC | PRN

## 2020-11-09 MED ORDER — PROPOFOL 500 MG/50ML IV EMUL
INTRAVENOUS | Status: DC | PRN
  Administered 2020-11-09: 25 ug/kg/min via INTRAVENOUS
  Administered 2020-11-09: 30 ug/kg/min via INTRAVENOUS

## 2020-11-09 MED ORDER — MIDAZOLAM HCL 2 MG/2ML IJ SOLN
INTRAMUSCULAR | Status: AC
  Filled 2020-11-09: qty 2

## 2020-11-09 MED ORDER — CEFAZOLIN SODIUM-DEXTROSE 2-4 GM/100ML-% IV SOLN: 2.0000 g | INTRAVENOUS | Status: DC

## 2020-11-09 SURGICAL SUPPLY — 33 items
ARMBAND PINK RESTRICT EXTREMIT (MISCELLANEOUS) ×2 IMPLANT
CANISTER SUCT 3000ML PPV (MISCELLANEOUS) ×2 IMPLANT
CLIP VESOCCLUDE MED 24/CT (CLIP) ×2 IMPLANT
CLIP VESOCCLUDE MED 6/CT (CLIP) IMPLANT
CLIP VESOCCLUDE SM WIDE 24/CT (CLIP) ×2 IMPLANT
CLIP VESOCCLUDE SM WIDE 6/CT (CLIP) IMPLANT
COVER PROBE W GEL 5X96 (DRAPES) ×2 IMPLANT
DERMABOND ADVANCED (GAUZE/BANDAGES/DRESSINGS) ×1
DERMABOND ADVANCED .7 DNX12 (GAUZE/BANDAGES/DRESSINGS) ×1 IMPLANT
ELECT REM PT RETURN 9FT ADLT (ELECTROSURGICAL) ×2
ELECTRODE REM PT RTRN 9FT ADLT (ELECTROSURGICAL) ×1 IMPLANT
GLOVE BIO SURGEON STRL SZ7.5 (GLOVE) ×2 IMPLANT
GLOVE SURG ENC TEXT LTX SZ6.5 (GLOVE) ×2 IMPLANT
GLOVE SURG GAMMEX PI TX LF 6.5 (GLOVE) ×2 IMPLANT
GOWN STRL REUS W/ TWL LRG LVL3 (GOWN DISPOSABLE) ×2 IMPLANT
GOWN STRL REUS W/ TWL XL LVL3 (GOWN DISPOSABLE) ×1 IMPLANT
GOWN STRL REUS W/TWL LRG LVL3 (GOWN DISPOSABLE) ×2
GOWN STRL REUS W/TWL XL LVL3 (GOWN DISPOSABLE) ×1
KIT BASIN OR (CUSTOM PROCEDURE TRAY) ×2 IMPLANT
KIT TURNOVER KIT B (KITS) ×2 IMPLANT
NS IRRIG 1000ML POUR BTL (IV SOLUTION) ×2 IMPLANT
PACK CV ACCESS (CUSTOM PROCEDURE TRAY) ×2 IMPLANT
PAD ARMBOARD 7.5X6 YLW CONV (MISCELLANEOUS) ×4 IMPLANT
SUT MNCRL AB 4-0 PS2 18 (SUTURE) ×2 IMPLANT
SUT PROLENE 5 0 C 1 24 (SUTURE) ×2 IMPLANT
SUT PROLENE 6 0 BV (SUTURE) ×8 IMPLANT
SUT SILK 0 TIES 10X30 (SUTURE) IMPLANT
SUT SILK 2 0 SH (SUTURE) ×2 IMPLANT
SUT VIC AB 3-0 SH 27 (SUTURE) ×1
SUT VIC AB 3-0 SH 27X BRD (SUTURE) ×1 IMPLANT
TOWEL GREEN STERILE (TOWEL DISPOSABLE) ×2 IMPLANT
UNDERPAD 30X36 HEAVY ABSORB (UNDERPADS AND DIAPERS) ×2 IMPLANT
WATER STERILE IRR 1000ML POUR (IV SOLUTION) ×2 IMPLANT

## 2020-11-09 NOTE — Anesthesia Procedure Notes (Signed)
Procedure Name: MAC Date/Time: 11/09/2020 8:07 AM Performed by: Imagene Riches, CRNA Pre-anesthesia Checklist: Patient identified, Emergency Drugs available, Suction available, Patient being monitored and Timeout performed Patient Re-evaluated:Patient Re-evaluated prior to induction Oxygen Delivery Method: Simple face mask

## 2020-11-09 NOTE — Transfer of Care (Signed)
Immediate Anesthesia Transfer of Care Note  Patient: Ezio Wieck  Procedure(s) Performed: LIGATION OF COMPETING BRANCHES OF LEFT BRACHIOCEPHALIC ARTERIOVENOUS FISTULA (Left Arm Upper) LEFT BRACHIOCEPHALIC ARTERIOVENOUS FISTULA TRANSPOSITION (Left Arm Upper)  Patient Location: PACU  Anesthesia Type:MAC combined with regional for post-op pain  Level of Consciousness: awake, alert  and oriented  Airway & Oxygen Therapy: Patient Spontanous Breathing  Post-op Assessment: Report given to RN and Post -op Vital signs reviewed and stable  Post vital signs: Reviewed and stable  Last Vitals:  Vitals Value Taken Time  BP 126/71 11/09/20 1005  Temp    Pulse 82 11/09/20 1010  Resp 16 11/09/20 1010  SpO2 94 % 11/09/20 1010  Vitals shown include unvalidated device data.  Last Pain:  Vitals:   11/09/20 0602  TempSrc:   PainSc: 0-No pain      Patients Stated Pain Goal: 2 (07/04/74 8832)  Complications: No complications documented.

## 2020-11-09 NOTE — Progress Notes (Signed)
Spoke with Dr. Rex Kras about patient's BP. Patient takes Lostaran at home at bedtime and did not take his nighttime dose last night. Baseline BP was 155/106. He instructed patient to take BP medication when arriving at home and then check his BP prior to his evening dose. If the DBP is above 110 then he is ok to take it. If it is below 110 then hold the medication until tomorrow. Patient also informed to keep a close eye on his BP and if it is still running high within the net 24-48 hours to follow up with his PCP. Patient showed understanding and significant other was informed with all of the prior information. Dr. Rex Kras ok with patient being discharged to home.

## 2020-11-09 NOTE — Discharge Instructions (Signed)
° °  [facility] [place] ° °Discharge Instructions ° °AV Fistula or Graft Surgery for Dialysis Access ° °Please refer to the following instructions for your post-procedure care. Your surgeon or physician assistant will discuss any changes with you. ° °Activity ° °You may drive the day following your surgery, if you are comfortable and no longer taking prescription pain medication. Resume full activity as the soreness in your incision resolves. ° °Bathing/Showering ° °You may shower after you go home. Keep your incision dry for 48 hours. Do not soak in a bathtub, hot tub, or swim until the incision heals completely. You may not shower if you have a hemodialysis catheter. ° °Incision Care ° °Clean your incision with mild soap and water after 48 hours. Pat the area dry with a clean towel. You do not need a bandage unless otherwise instructed. Do not apply any ointments or creams to your incision. You may have skin glue on your incision. Do not peel it off. It will come off on its own in about one week. Your arm may swell a bit after surgery. To reduce swelling use pillows to elevate your arm so it is above your heart. Your doctor will tell you if you need to lightly wrap your arm with an ACE bandage. ° °Diet ° °Resume your normal diet. There are not special food restrictions following this procedure. In order to heal from your surgery, it is CRITICAL to get adequate nutrition. Your body requires vitamins, minerals, and protein. Vegetables are the best source of vitamins and minerals. Vegetables also provide the perfect balance of protein. Processed food has little nutritional value, so try to avoid this. ° °Medications ° °Resume taking all of your medications. If your incision is causing pain, you may take over-the counter pain relievers such as acetaminophen (Tylenol). If you were prescribed a stronger pain medication, please be aware these medications can cause nausea and constipation. Prevent  nausea by taking the medication with a snack or meal. Avoid constipation by drinking plenty of fluids and eating foods with high amount of fiber, such as fruits, vegetables, and grains. Do not take Tylenol if you are taking prescription pain medications. ° ° ° ° °Follow up °Your surgeon may want to see you in the office following your access surgery. If so, this will be arranged at the time of your surgery. ° °Please call us immediately for any of the following conditions: ° °Increased pain, redness, drainage (pus) from your incision site °Fever of 101 degrees or higher °Severe or worsening pain at your incision site °Hand pain or numbness. ° °Reduce your risk of vascular disease: ° °Stop smoking. If you would like help, call [facility] at 1-800-QUIT-NOW (1-800-784-8669) or [place] at 336-586-4000 ° °Manage your cholesterol °Maintain a desired weight °Control your diabetes °Keep your blood pressure down ° °Dialysis ° °It will take several weeks to several months for your new dialysis access to be ready for use. Your surgeon will determine when it is OK to use it. Your nephrologist will continue to direct your dialysis. You can continue to use your Permcath until your new access is ready for use. ° °If you have any questions, please call the office at [phone]. ° °

## 2020-11-09 NOTE — Op Note (Signed)
Patient name: Jerry [name] MRN: [id number] DOB: July 16, 1971 Sex: male  11/09/2020 Pre-operative Diagnosis: esrd, primary nonfunction left arm AV fistula Post-operative diagnosis:  Same Surgeon:  Eda Paschal. Donzetta Matters, MD Assistant: Risa Grill, PA Procedure Performed:  Revision of left arm brachial artery to cephalic vein fistula with transposition  Indications: 50 year old male with history of end-stage renal disease.  He was previously on dialysis via forearm access on the right which was subsequently ligated and he currently dialyzes via left sided catheter.  He has an immature left arm AV fistula which is now indicated for revision.  Assistant was necessary to expedite the case.  Findings: There were large branches in the upper arm fistula did dilate up to 5 mm.  There was significant scar tissue at arterial to venous anastomosis site and it appeared severely diseased by ultrasound which was likely the limiting factor to maturation of the fistula.  The fistula was dissected free for the entirety of its course and a new anastomosis was created.  At completion there was good flow in the fistula and a palpable radial artery pulse the wrist.   Procedure:  The patient was identified in the holding area and taken to the operating room where he was placed supine on the operative table.  Previous block had been placed.  Timeout was called he was sterilely prepped and draped in the left upper extremity.  The block was checked was noted to be intact.  Ultrasound was used to identify the fistula marked it in the upper arm.  2 separate incisions were made in the upper arm.  We dissected the fistula free and marked it for orientation.  The ultrasound was used we identified what appeared to be significant disease at the previous anastomosis.  We then reopened the previous incision dissected down to the brachial artery.  We obtained proximal and distal control.  I was able dissect out the brachial artery  proximally but distally I had to just clamp it.  Proximally distally clamped the brachial artery I transected the fistula just after the anastomosis.  There was significant hyperplastic tissue there.  This was all dissected out.  We flushed the brachial artery proximally distally with heparinized saline.  I then flushed the fistula with heparinized saline and clamped it and marked it for orientation.  It was then tunneled just below the skin surface.  I spatulated and ended up removing approximately 1 cm of the diseased segment of the cephalic vein.  I then sewed the fistula end-to-end to the previous anastomosis which had been cleaned up from hyperplastic tissue.  Prior to completion of flushing all directions.  Upon completion there was good flow in the fistula by Doppler he did feel little bit pulsatile we freed up some more tissue towards the shoulder.  There was improved flow with improved thrill although somewhat pulsatile.  There was a good palpable radial artery pulse there was good signal at the radial artery as well as ulnar artery this did not augment with compression of the fistula.  Satisfied with this we irrigated the wounds we obtain hemostasis.  There was continued oozing.  We closed the antecubital incision with 3-0 Vicryl followed by Monocryl in the upper arm incisions were closed with fistula with Monocryl only.  Dermabond was placed at all sites.  The arm was wrapped with a gentle Ace wrap.  He was awakened from anesthesia having tolerated procedure without any complication.  All counts were correct at completion.   EBL:  100cc  Blaise Grieshaber C. Donzetta Matters, MD [facility] Office: [phone] Pager: [phone]

## 2020-11-09 NOTE — Anesthesia Procedure Notes (Signed)
Anesthesia Regional Block: Supraclavicular block   Pre-Anesthetic Checklist: ,, timeout performed, Correct Patient, Correct Site, Correct Laterality, Correct Procedure, Correct Position, site marked, Risks and benefits discussed,  Surgical consent,  Pre-op evaluation,  At surgeon's request and post-op pain management  Laterality: Left  Prep: Dura Prep       Needles:  Injection technique: Single-shot  Needle Type: Echogenic Stimulator Needle     Needle Length: 5cm  Needle Gauge: 20     Additional Needles:   Procedures:,,,, ultrasound used (permanent image in chart),,,,  Narrative:  Start time: 11/09/2020 7:12 AM End time: 11/09/2020 7:16 AM Injection made incrementally with aspirations every 5 mL.  Performed by: Personally  Anesthesiologist: Darral Dash, DO  Additional Notes: Patient identified. Risks/Benefits/Options discussed with patient including but not limited to bleeding, infection, nerve damage, failed block, incomplete pain control. Patient expressed understanding and wished to proceed. All questions were answered. Sterile technique was used throughout the entire procedure. Please see nursing notes for vital signs. Aspirated in 5cc intervals with injection for negative confirmation. Patient was given instructions on fall risk and not to get out of bed. All questions and concerns addressed with instructions to call with any issues or inadequate analgesia.

## 2020-11-09 NOTE — Progress Notes (Signed)
Orthopedic Tech Progress Note Patient Details:  Jerry [name] 02/16/1971 [id number] PACU RN called requesting an ARM SLING for patient Ortho Devices Type of Ortho Device: Arm sling Ortho Device/Splint Location: LUE Ortho Device/Splint Interventions: Adjustment,Application   Post Interventions Patient Tolerated: Well Instructions Provided: Care of device   [name] 11/09/2020, 10:43 AM

## 2020-11-09 NOTE — Interval H&P Note (Signed)
History and Physical Interval Note:  11/09/2020 7:18 AM  Jerry [name]  has presented today for surgery, with the diagnosis of ESRD.  The various methods of treatment have been discussed with the patient and family. After consideration of risks, benefits and other options for treatment, the patient has consented to  Procedure(s): LIGATION OF COMPETING BRANCHES OF LEFT BRACHIOCEPHALIC ARTERIOVENOUS FISTULA (Left) LEFT BRACHIOCEPHALIC ARTERIOVENOUS FISTULA TRANSPOSITION (Left) as a surgical intervention.  I have also discussed possibly placing a graft if the vein does not appear adequate. The patient's history has been reviewed, patient examined, no change in status, stable for surgery.  I have reviewed the patient's chart and labs.  Questions were answered to the patient's satisfaction.     Servando Snare

## 2020-11-09 NOTE — Anesthesia Postprocedure Evaluation (Signed)
Anesthesia Post Note  Patient: Jerry [name]  Procedure(s) Performed: LIGATION OF COMPETING BRANCHES OF LEFT BRACHIOCEPHALIC ARTERIOVENOUS FISTULA (Left Arm Upper) LEFT BRACHIOCEPHALIC ARTERIOVENOUS FISTULA TRANSPOSITION (Left Arm Upper)     Patient location during evaluation: PACU Anesthesia Type: MAC and Regional Level of consciousness: awake and alert Pain management: pain level controlled Vital Signs Assessment: post-procedure vital signs reviewed and stable Respiratory status: spontaneous breathing, nonlabored ventilation, respiratory function stable and patient connected to nasal cannula oxygen Cardiovascular status: stable and blood pressure returned to baseline Postop Assessment: no apparent nausea or vomiting Anesthetic complications: no   No complications documented.  Last Vitals:  Vitals:   11/09/20 1205 11/09/20 1220  BP: (!) 174/118 (!) 168/126  Pulse: 88 72  Resp: 19 12  Temp:  36.9 C  SpO2: 97% 100%    Last Pain:  Vitals:   11/09/20 1220  TempSrc:   PainSc: 0-No pain                 Belenda Cruise P Arrayah Connors

## 2020-11-10 ENCOUNTER — Encounter (HOSPITAL_COMMUNITY): Payer: Self-pay | Admitting: Vascular Surgery

## 2020-11-19 ENCOUNTER — Other Ambulatory Visit: Payer: Self-pay | Admitting: *Deleted

## 2020-11-19 DIAGNOSIS — N186 End stage renal disease: Secondary | ICD-10-CM

## 2020-11-22 ENCOUNTER — Other Ambulatory Visit: Payer: Self-pay | Admitting: Physician Assistant

## 2020-11-22 ENCOUNTER — Telehealth: Payer: Self-pay

## 2020-11-22 MED ORDER — TRAMADOL HCL 50 MG PO TABS: 50.0000 mg | ORAL_TABLET | Freq: Four times a day (QID) | ORAL | 0 refills | Status: DC | PRN

## 2020-11-22 NOTE — Telephone Encounter (Signed)
Patient called to report pain in his arm s/p revision and transposition on 4/26. Says the pain is incisional and there is some slight oozing of blood at the incision site. Denies redness, pus, odor, chills or fever. He is able to move his arm and fingers freely. Says his arm is swollen. Advised him to elevate extremity and PA called in tramadol RX. He will follow up with a post op next week - knows to call sooner if s/s of infection develop.

## 2020-11-30 IMAGING — US US ABDOMEN COMPLETE
1 series · 13 of 25 positions shown · non-contrast
Comparison: No prior.

CLINICAL DATA: Abdominal pain.

EXAM:
ABDOMEN ULTRASOUND COMPLETE

[Series 1: us abdomen complete · 0.23mm/px · 13 of 116 slices shown]
[im 1/116]
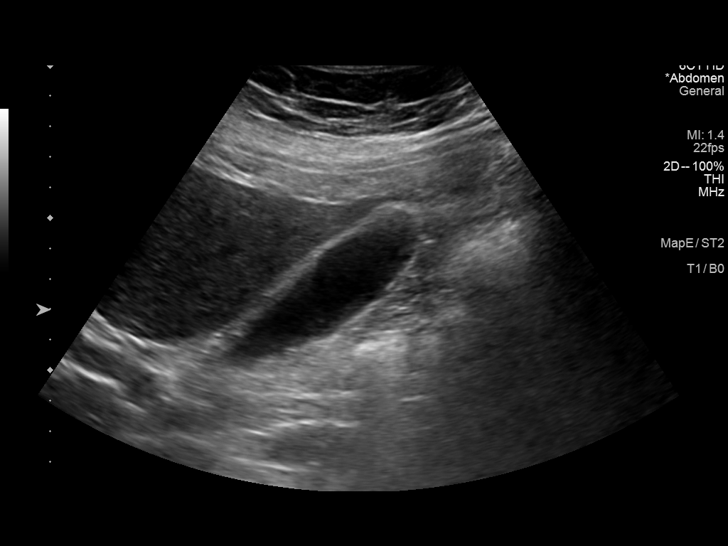
[im 10/116]
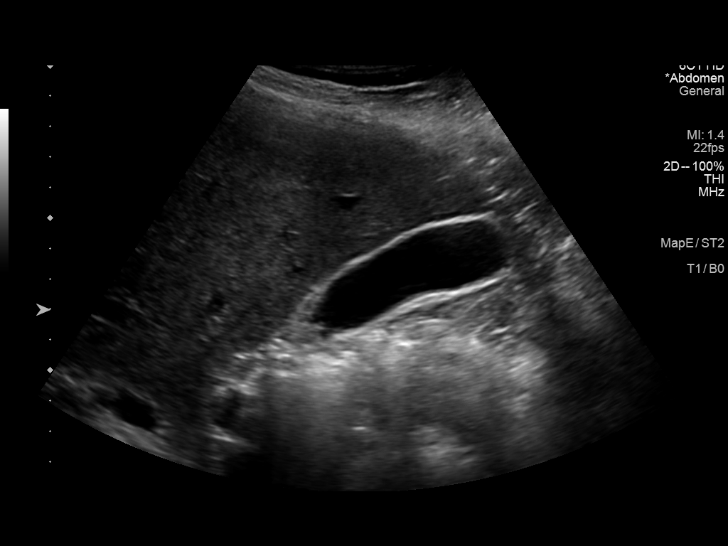
[im 20/116]
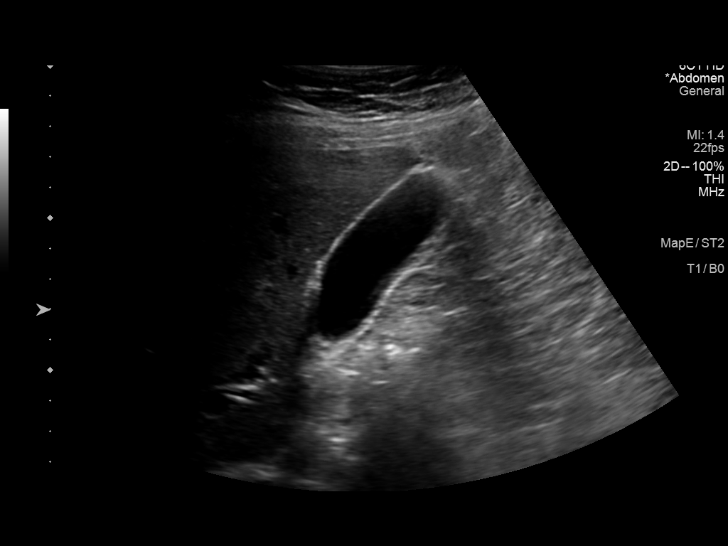
[im 29/116]
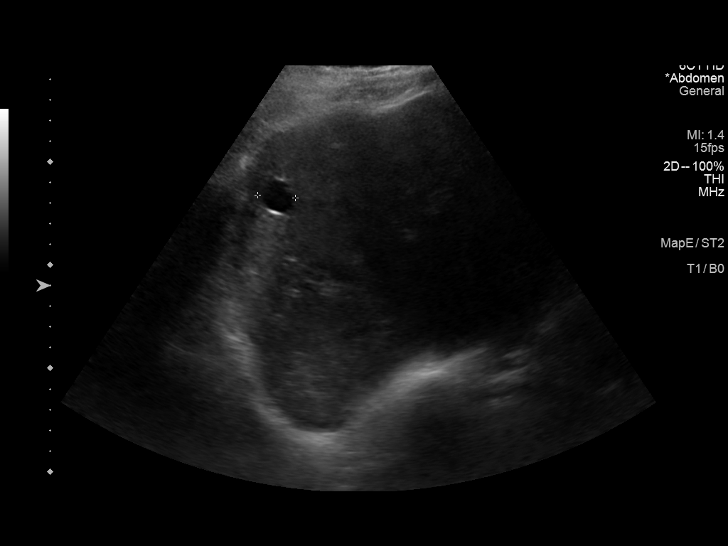
[im 39/116]
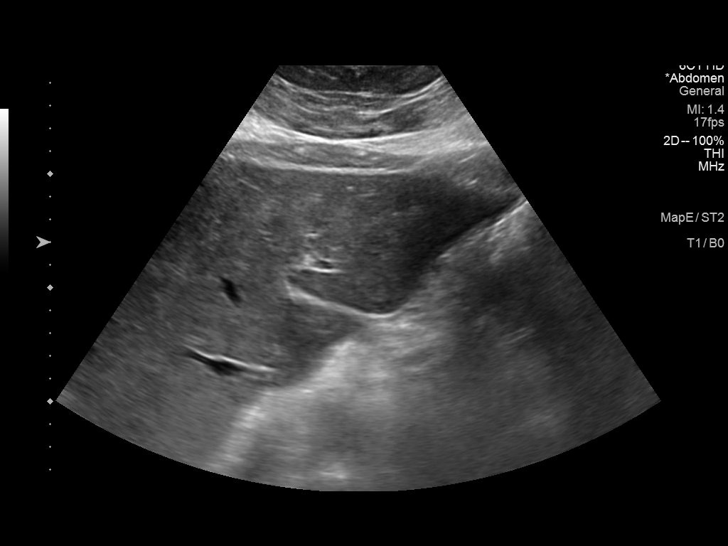
[im 48/116]
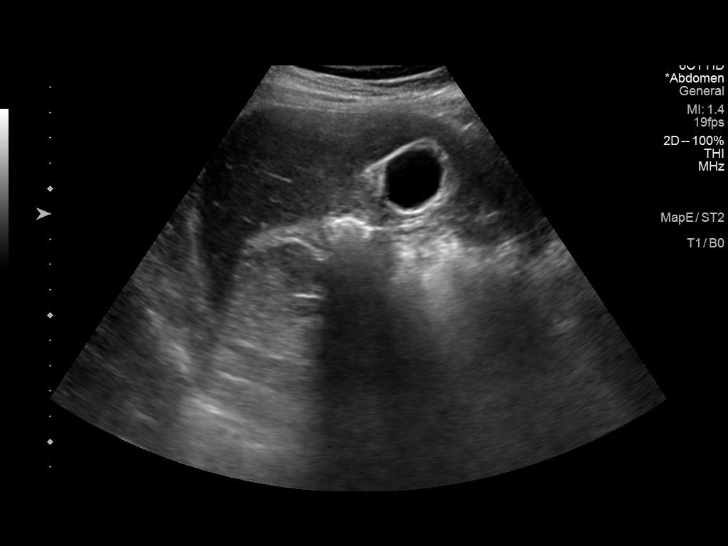
[im 58/116]
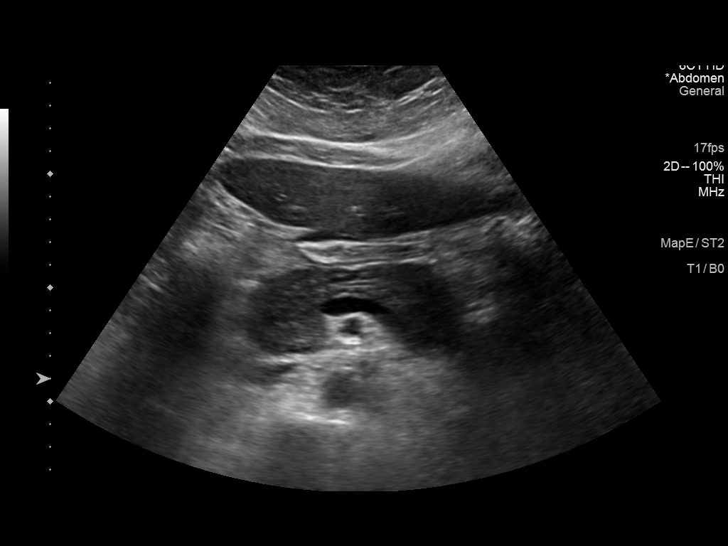
[im 68/116]
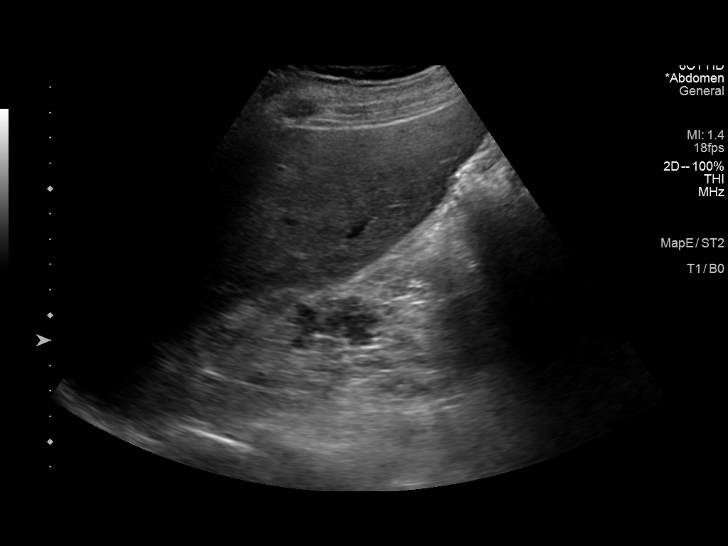
[im 77/116]
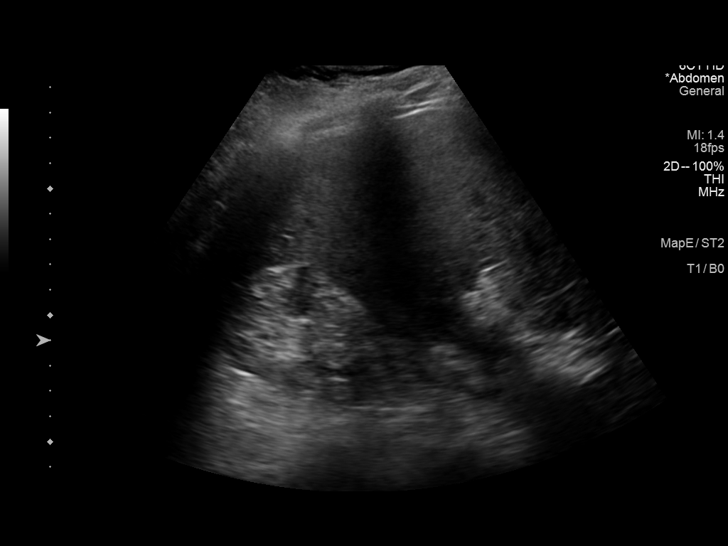
[im 87/116]
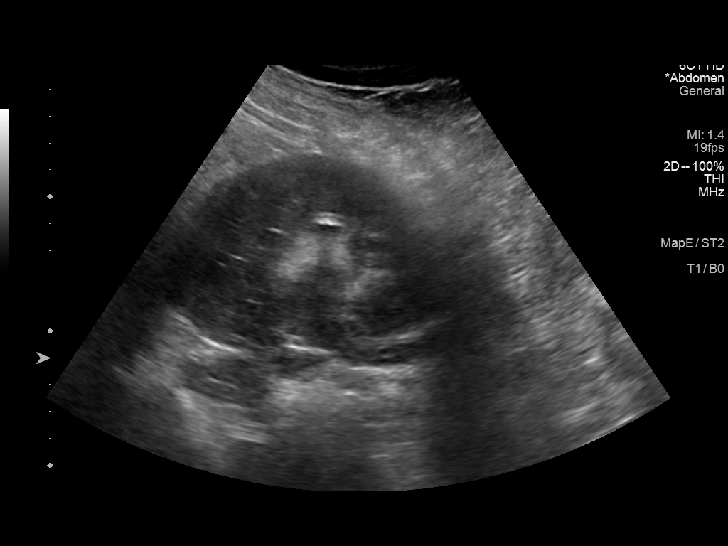
[im 96/116]
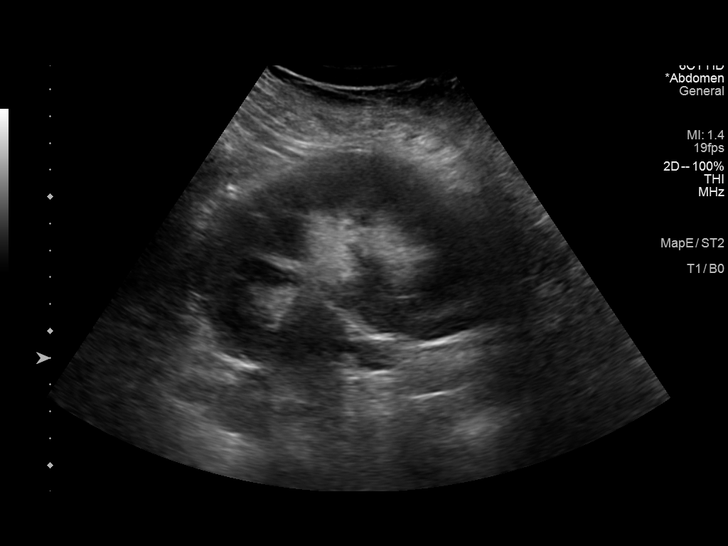
[im 106/116]
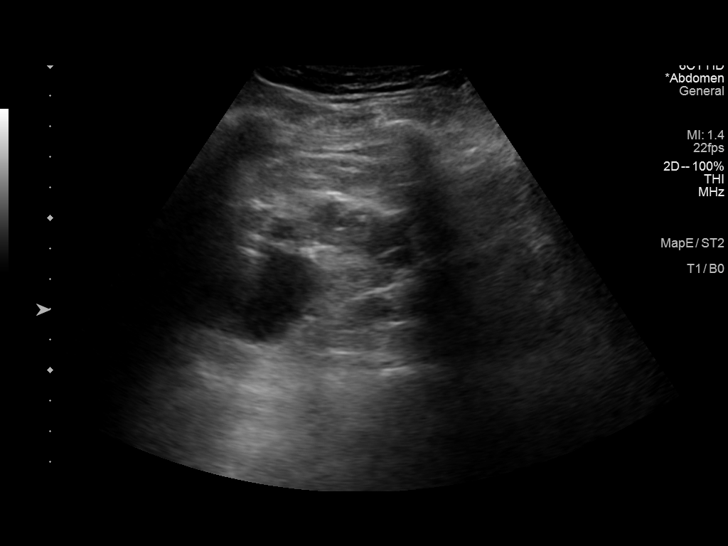
[im 116/116]
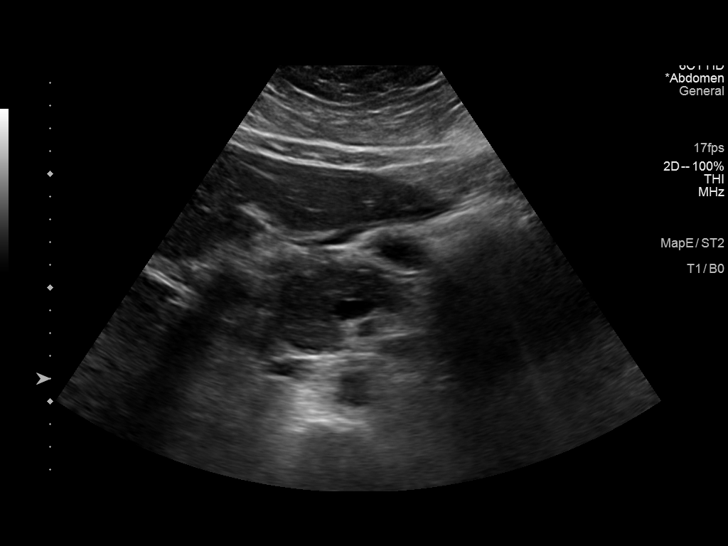

[13 of 25 positions shown; findings below may reference images not displayed]

FINDINGS: Gallbladder: No gallstones or wall thickening visualized. No
sonographic Murphy sign noted by sonographer.

Common bile duct: Diameter: 1.6 mm

Liver: 1.6 x 1.3 x 1.8 cm cyst right hepatic lobe. Portal vein is
patent on color Doppler imaging with normal direction of blood flow
towards the liver.

IVC: No abnormality visualized.

Pancreas: Visualized portion unremarkable.

Spleen: Size and appearance within normal limits.

Right Kidney: Length: 8.9 cm. Increased echogenicity. Multiple tiny
cysts. Thinly septated 2.5 cm cyst. This is most likely benign.

Left Kidney: Length: 17.0 cm. Increased echogenicity. Innumerable
large cysts, the largest measures 3.8 cm and is thinly septated most
likely benign.

Right lower quadrant renal transplant measures 11.7 cm. Mild
hydronephrosis of the transplant cannot be excluded.

The bladder is decompressed.

Abdominal aorta: No aneurysm visualized.

Other findings: None.
IMPRESSION: 1.  No gallstones or biliary distention.

2.  1.8 cm simple cyst right hepatic lobe.

3. Echogenic kidneys with multiple benign-appearing cysts consistent
with patient's known chronic renal disease. Right lower quadrant
transplant mild hydronephrosis cannot be excluded. Bladder is
decompressed.

## 2020-12-01 ENCOUNTER — Ambulatory Visit (HOSPITAL_COMMUNITY)
Admission: RE | Admit: 2020-12-01 | Discharge: 2020-12-01 | Disposition: A | Discharge status: Home / Self Care | Payer: Medicare (Managed Care) | Source: Ambulatory Visit | Attending: Vascular Surgery | Admitting: Vascular Surgery

## 2020-12-01 ENCOUNTER — Ambulatory Visit (INDEPENDENT_AMBULATORY_CARE_PROVIDER_SITE_OTHER): Payer: Medicare (Managed Care) | Admitting: Physician Assistant

## 2020-12-01 ENCOUNTER — Other Ambulatory Visit: Payer: Self-pay

## 2020-12-01 VITALS — BP 134/94 | HR 106 | Temp 98.6°F | Resp 20 | Ht 68.0 in | Wt 248.0 lb

## 2020-12-01 DIAGNOSIS — Z992 Dependence on renal dialysis: Secondary | ICD-10-CM | POA: Insufficient documentation

## 2020-12-01 DIAGNOSIS — N186 End stage renal disease: Secondary | ICD-10-CM

## 2020-12-01 NOTE — Progress Notes (Signed)
POST OPERATIVE DIALYSIS ACCESS OFFICE NOTE    CC:  F/u for dialysis access surgery  HPI:  [name] who is s/p revision of left arm brachial artery to cephalic vein fistula with transposition on 11/09/2020 by Dr. Donzetta Matters. HD via left IJ catheter.  He reports he had a degree of early postop edema with skin edge separation of his distal incision.  This is improved.  He denies hand pain or numbness.  He denies fever or chills  Dialysis days:  MWF Dialysis center:  [facility]  Allergies  Allergen Reactions  . Chlorhexidine Itching    Burns skin  . Other Other (See Comments)    Local anesthesia (quickly metabolizes)  . Tape Other (See Comments)    Adhesive tape (irritates skin)    Current Outpatient Medications  Medication Sig Dispense Refill  . acetaminophen (TYLENOL) 500 MG tablet Take 1,000 mg by mouth every 6 (six) hours as needed for mild pain.    . calcium acetate (PHOSLO) 667 MG capsule Take 1,334-2,668 mg by mouth with breakfast, with lunch, and with evening meal. Take 4 capsules (2,668 mg) by mouth with each meal & take 2 capsules (1,334 mg) by mouth with snacks    . CIALIS 20 MG tablet Take 20 mg by mouth daily as needed for erectile dysfunction.    Marland Kitchen losartan (COZAAR) 25 MG tablet Take 25 mg by mouth at bedtime.    . Methoxy PEG-Epoetin Beta (MIRCERA IJ) Mircera    . multivitamin (RENA-VIT) TABS tablet Take 1 tablet by mouth every evening.    . traMADol (ULTRAM) 50 MG tablet Take 1 tablet (50 mg total) by mouth every 6 (six) hours as needed. 20 tablet 0   Current Facility-Administered Medications  Medication Dose Route Frequency Provider Last Rate Last Admin  . 0.9 %  sodium chloride infusion  250 mL Intravenous PRN Serafina Mitchell, MD         ROS:  See HPI  BP (!) 134/94 (BP Location: Right Arm, Patient Position: Sitting, Cuff Size: Large)   Pulse (!) 106   Temp 98.6 F (37 C) (Temporal)   Resp 20   Ht '5\' 8"'$  (1.727 m)   Wt 248 lb (112.5 kg)   SpO2 96%    BMI 37.71 kg/m    Physical Exam:  General appearance: Well-developed, well-nourished in no distress Cardiac: Rate and rhythm are regular Respiratory: Nonlabored Incision: Left proximal incision is well approximated with overlying crust.  Distal incision has an approximately 1 and half centimeter skin edge separation and no signs of infection or cellulitis.  No drainage Extremities: Mild left upper arm edema. Soft tissues are firm in the area between his incisions most likely area of hematoma. Left hand is warm and well-perfused.  Intact sensation and motor function.  1+ left radial pulse.  Good bruit and thrill in fistula.     Dialysis duplex on 12/01/2020 Patent arteriovenous fistula with a fluid collection observed around the mid upper arm segment of the cephalic outflow vein. Two areas of narrowing observed with elevated velocities.  Assessment/Plan:   Status post revision left brachiocephalic AV fistula.  Mild edema and hematoma noted. Small skin edge separation without cellulitis. Recommend follow-up for resolution of swelling and hematoma prior to trying to access fistula. Follow-up in 3 weeks with duplex. Advised to call us sooner should he develop worsening edema or skin breakdown.  Barbie Banner, PA-C 12/01/2020 3:09 PM [facility] [phone]  Clinic MD:  Dr. Scot Dock

## 2020-12-02 ENCOUNTER — Other Ambulatory Visit: Payer: Self-pay

## 2020-12-02 DIAGNOSIS — N186 End stage renal disease: Secondary | ICD-10-CM

## 2020-12-24 ENCOUNTER — Encounter (HOSPITAL_COMMUNITY): Payer: Medicare (Managed Care)

## 2020-12-24 ENCOUNTER — Ambulatory Visit (HOSPITAL_COMMUNITY)
Admission: RE | Admit: 2020-12-24 | Discharge: 2020-12-24 | Disposition: A | Discharge status: Home / Self Care | Payer: Medicare (Managed Care) | Source: Ambulatory Visit | Attending: Physician Assistant | Admitting: Physician Assistant

## 2020-12-24 ENCOUNTER — Ambulatory Visit (INDEPENDENT_AMBULATORY_CARE_PROVIDER_SITE_OTHER): Payer: Medicare (Managed Care) | Admitting: Physician Assistant

## 2020-12-24 ENCOUNTER — Other Ambulatory Visit: Payer: Self-pay

## 2020-12-24 ENCOUNTER — Ambulatory Visit: Payer: Medicare (Managed Care)

## 2020-12-24 VITALS — BP 158/108 | HR 94 | Temp 98.0°F | Resp 20 | Ht 68.0 in | Wt 250.9 lb

## 2020-12-24 DIAGNOSIS — N186 End stage renal disease: Secondary | ICD-10-CM | POA: Insufficient documentation

## 2020-12-24 DIAGNOSIS — Z992 Dependence on renal dialysis: Secondary | ICD-10-CM

## 2020-12-24 NOTE — Progress Notes (Addendum)
Postoperative Access Visit   History of Present Illness   Jerry [name] is a 50 y.o. year old male who presents for postoperative follow-up for: revision of left arm brachial artery to cephalic vein fistula with transposition 11/09/20 by Dr. Donzetta Matters. He was last seen on 5/18 at which time he had a small area of separation in the distal incision. He had no steal symptoms. He was advised to follow up in 3 weeks for wound check and duplex.  He presents today for that appointment. His wounds are almost healed.  The patient notes no steal symptoms. He denies any bleeding, swelling, redness or pain. He denies fever or chills  He dialyzes on MWF at the [facility] location via a left IJ [facility]  Physical Examination   Vitals:   12/24/20 0844  BP: (!) 158/108  Pulse: 94  Resp: 20  Temp: 98 F (36.7 C)  TempSrc: Temporal  SpO2: 96%  Weight: 250 lb 14.4 oz (113.8 kg)  Height: '5\' 8"'$  (1.727 m)   Body mass index is 38.15 kg/m.  left arm Incisions are almost healed. There are two small scabs still present. No surrounding erythema. No drainage. No warmth. 2+ radial pulse, hand grip is 5/5, sensation in digits is  intact, palpable thrill, bruit can  be auscultated        Non invasive vascular lab: Findings:  +--------------------+----------+-----------------+--------+  AVF                 PSV (cm/s)Flow Vol (mL/min)Comments  +--------------------+----------+-----------------+--------+  Native artery inflow   116           902                 +--------------------+----------+-----------------+--------+  AVF Anastomosis        295                               +--------------------+----------+-----------------+--------+      +------------+----------+-------------+----------+------------------+  OUTFLOW VEINPSV (cm/s)Diameter (cm)Depth (cm)     Describe       +------------+----------+-------------+----------+------------------+  Shoulder       475        0.53         1.09                       +------------+----------+-------------+----------+------------------+  Prox UA        700        0.45        1.05   change in Diameter  +------------+----------+-------------+----------+------------------+  Mid UA         273        0.57        0.56        hematoma       +------------+----------+-------------+----------+------------------+  Dist UA        233        0.59        0.51                       +------------+----------+-------------+----------+------------------+  AC Fossa       305        0.53        1.18                       +------------+----------+-------------+----------+------------------+   Patent fistula with good volume flow. Hematoma in the  mid upper arm that is unchanged from prior study on 12/01/20     Medical Decision Making   Jerry [name] is a 50 y.o. year old male who presents s/p revision of left arm brachial artery to cephalic vein fistula with transposition 11/09/20 by Dr. Donzetta Matters. Doing well post op. Incisions are almost healed. There are two small scabs present. Appears to be a small hematoma still on duplex otherwise patent AV fistula.This is not appreciable clinically.  Patent is without signs or symptoms of steal syndrome Fistula is ready for use but would recommend waiting to cannulate until the scabs have fully resolved The patient's tunneled dialysis catheter can be removed when Nephrology is comfortable with the performance of the left AVF The patient may follow up on a prn basis   Karoline Caldwell, PA-C [facility] [place]: [phone]  On Call MD: Dr. Stanford Breed

## 2021-05-18 ENCOUNTER — Telehealth: Payer: Self-pay

## 2021-05-18 NOTE — Telephone Encounter (Signed)
Rec'd ref from Dr. Clover Mealy - unable to run AVF- Dr. Augustin Coupe unable to intervene, pt felt bruit - to [facility] to schedule fistulagram

## 2021-05-20 ENCOUNTER — Other Ambulatory Visit: Payer: Self-pay

## 2021-05-23 NOTE — Addendum Note (Signed)
Addended by: Nicholas Lose on: 05/23/2021 02:31 PM   Modules accepted: Orders

## 2021-06-30 ENCOUNTER — Encounter (HOSPITAL_COMMUNITY): Admission: RE | Payer: Self-pay | Source: Ambulatory Visit

## 2021-06-30 ENCOUNTER — Ambulatory Visit (HOSPITAL_COMMUNITY)
Admission: RE | Admit: 2021-06-30 | Payer: Medicare (Managed Care) | Source: Ambulatory Visit | Admitting: Vascular Surgery

## 2021-06-30 SURGERY — A/V FISTULAGRAM
Anesthesia: LOCAL | Laterality: Left

## 2021-08-27 IMAGING — DX DG CHEST 1V
1 series · 1 of 1 positions shown · non-contrast
Comparison: Chest radiograph dated 01/29/2018.

CLINICAL DATA: 49-year-old male status post left IJ dialysis
catheter placement.

EXAM:
CHEST  1 VIEW

[chest ap]
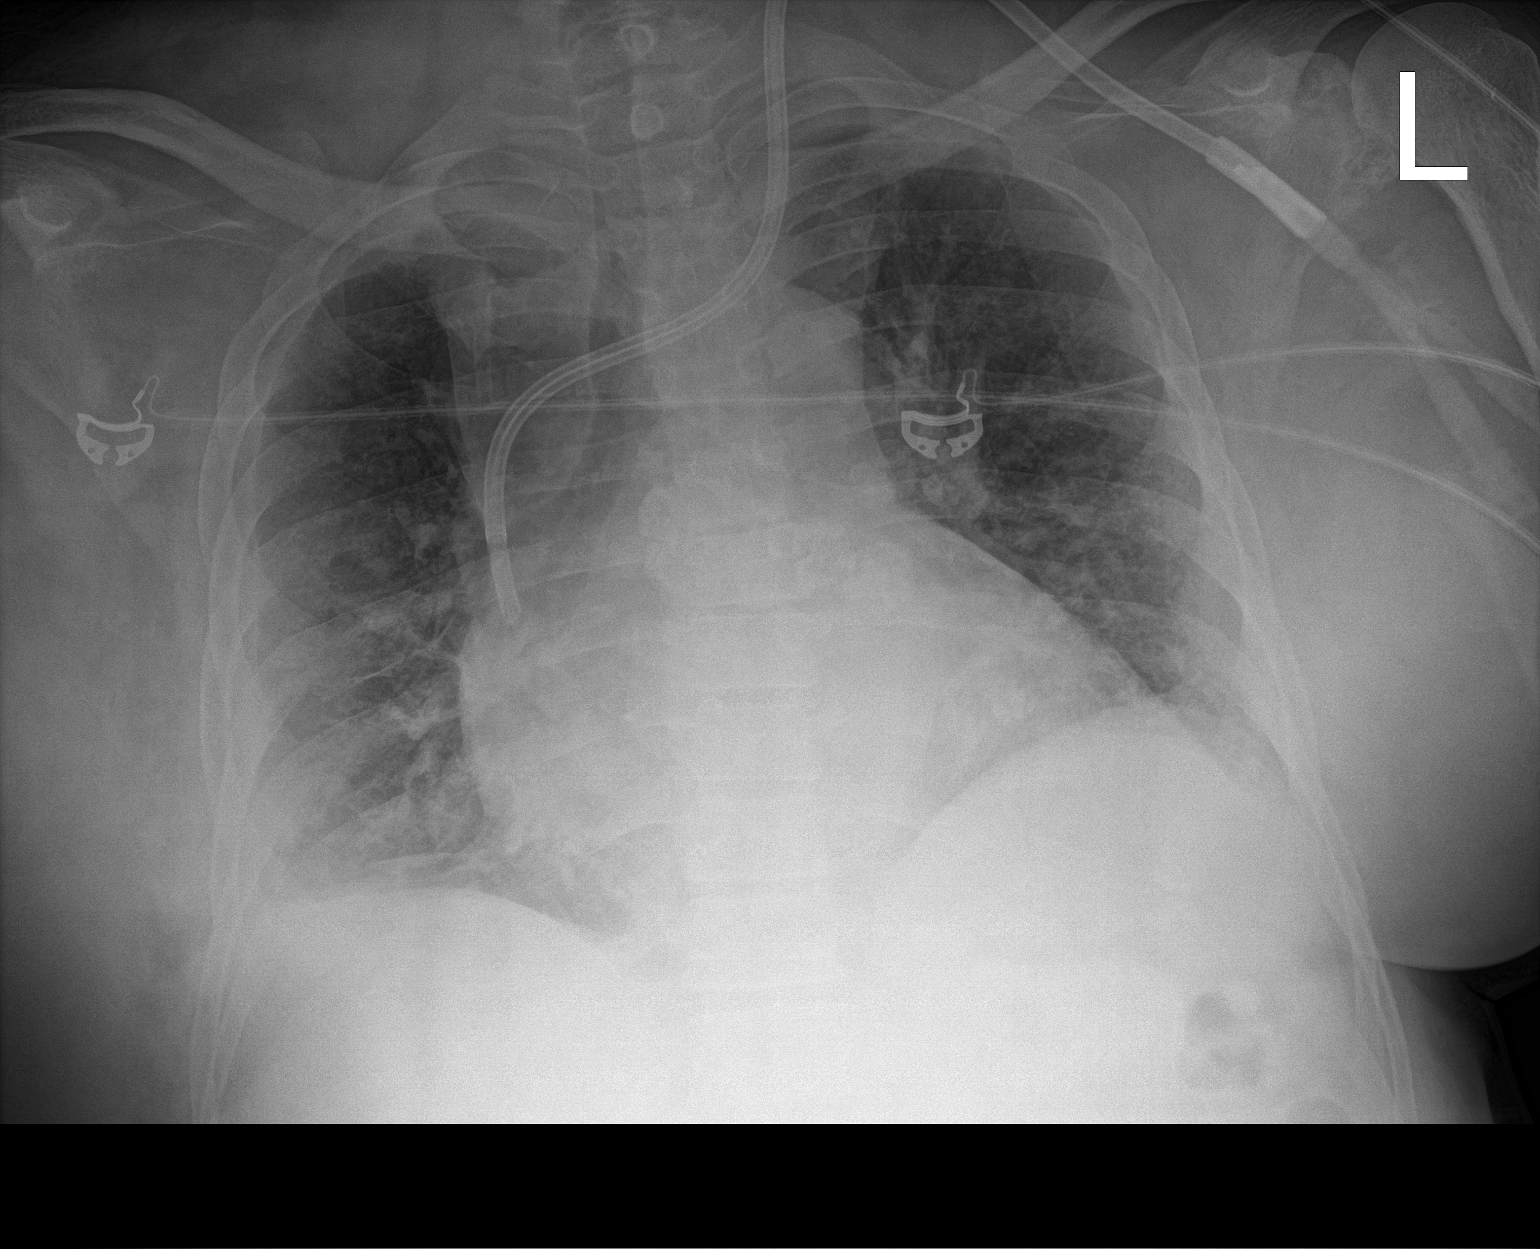

[1 of 1 positions shown; findings below may reference images not displayed]

FINDINGS: Left IJ central venous line with tip over central SVC close to the
cavoatrial junction. No pneumothorax. There is cardiomegaly with
vascular congestion. Minimal bibasilar atelectasis. Pneumonia is not
excluded. Small bilateral pleural effusions suspected. No acute
osseous pathology.
IMPRESSION: 1. Left IJ central venous line with tip over central SVC. No
pneumothorax.
2. Cardiomegaly with vascular congestion and small bilateral pleural
effusions.

## 2021-08-27 IMAGING — DX DG FOREARM 2V*R*
2 series · 2 of 2 positions shown · non-contrast
Comparison: None.

CLINICAL DATA: Cellulitis

EXAM:
RIGHT FOREARM - 2 VIEW

[forearm ap]
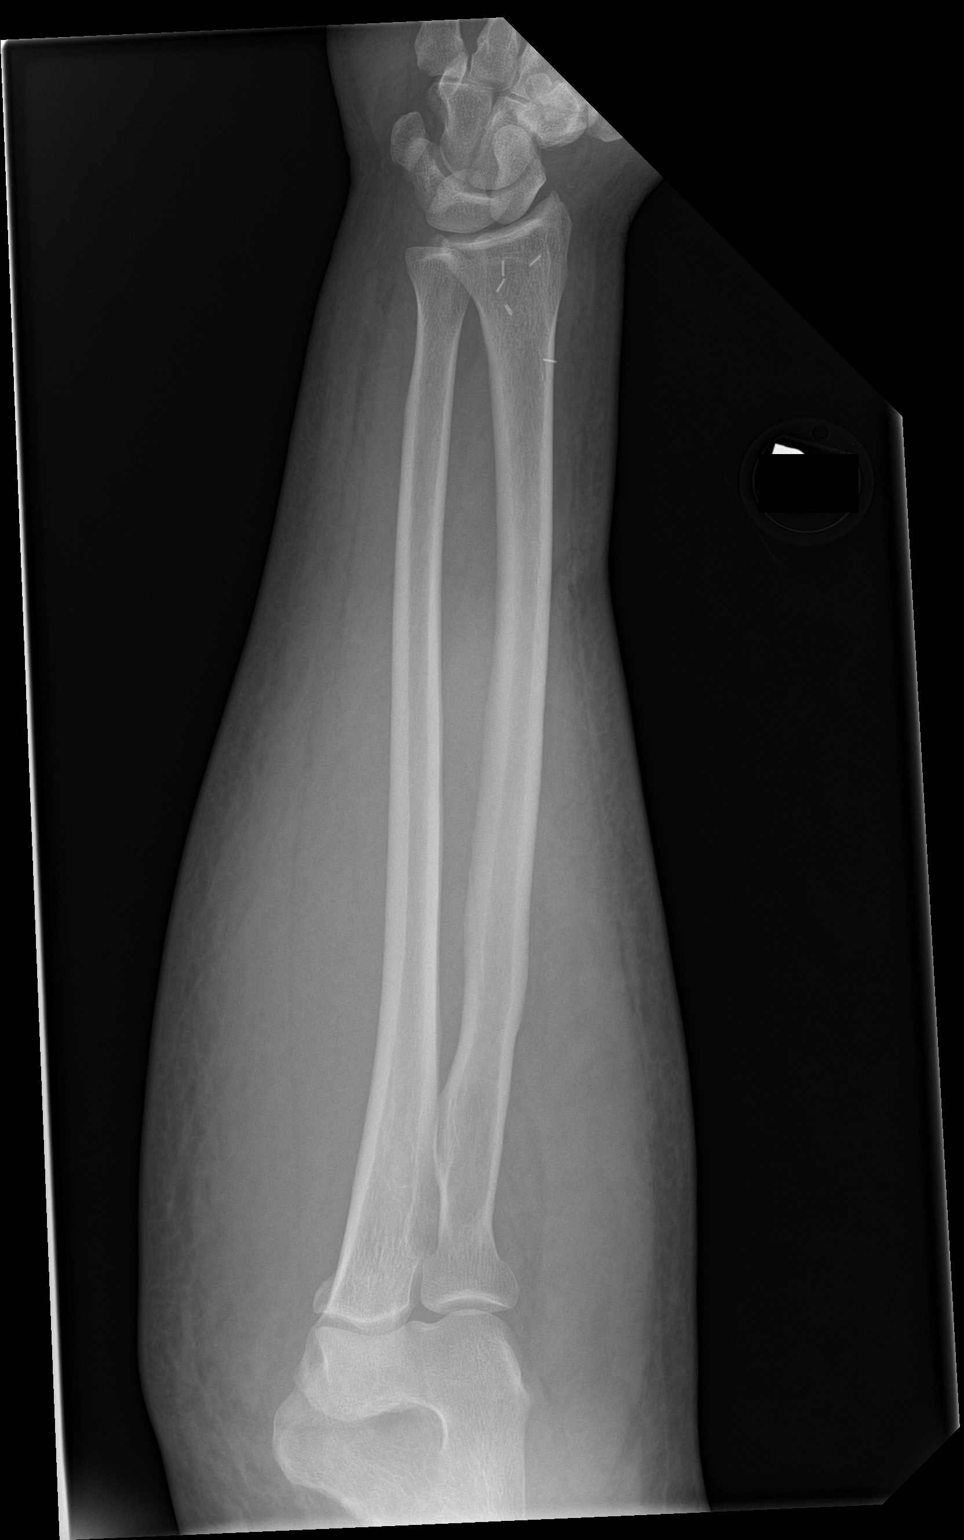

[forearm lat]
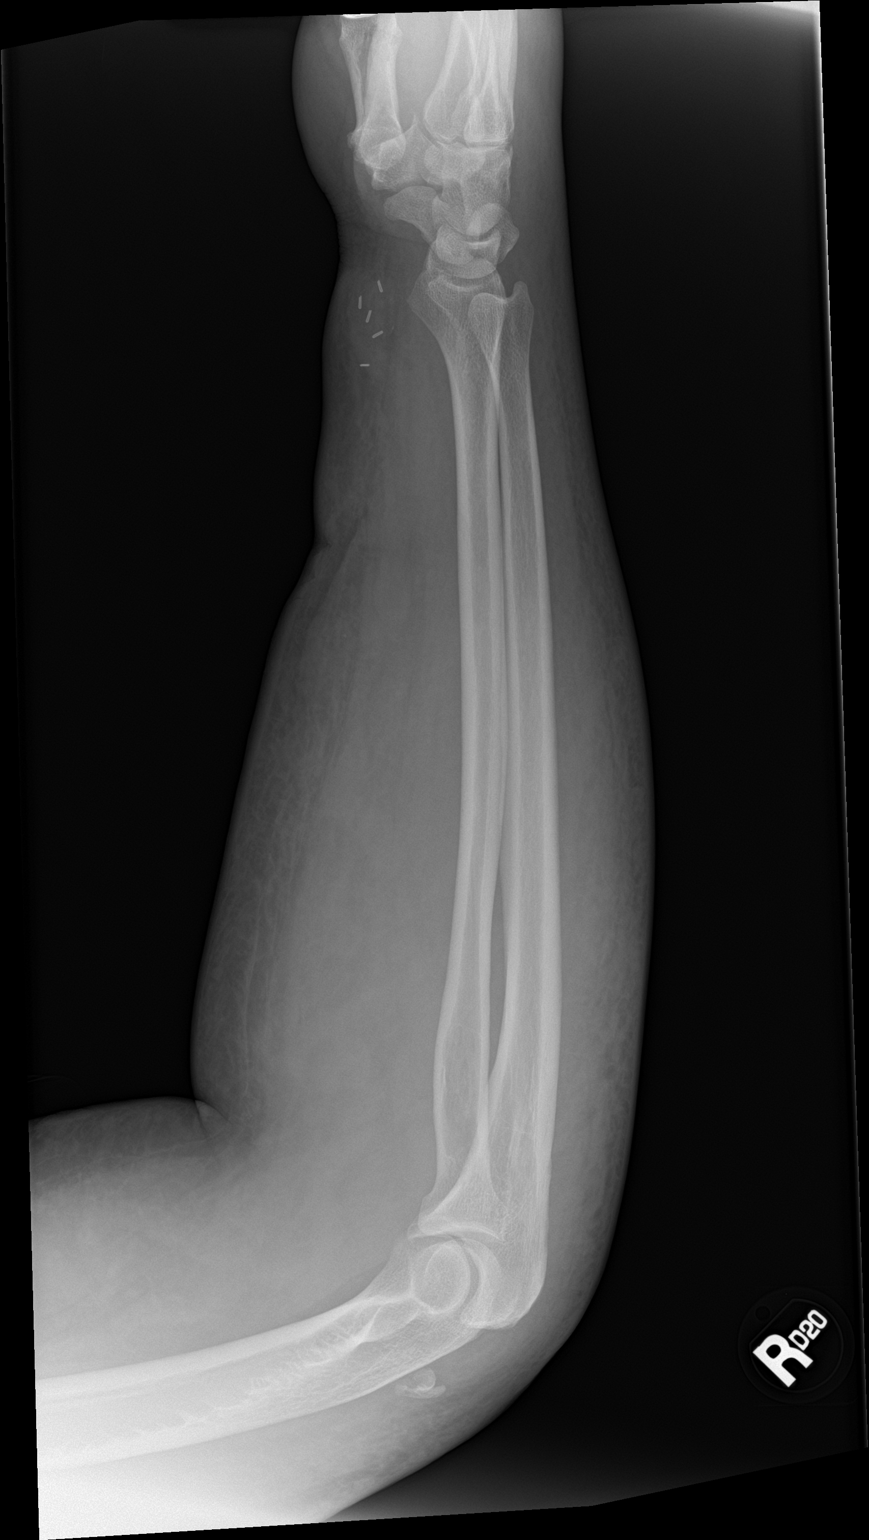

[2 of 2 positions shown; findings below may reference images not displayed]

FINDINGS: No acute displaced fracture or malalignment. Smooth calcifications
posterior to the distal humerus, possible loose bodies or
nonspecific soft tissue calcification. No significant elbow
effusion. No periostitis or bone destruction. Clips along the volar
wrist. Extensive subcutaneous edema without emphysema.
IMPRESSION: 1. No acute osseous abnormality.
2. Extensive subcutaneous edema.

## 2022-08-12 ENCOUNTER — Inpatient Hospital Stay (HOSPITAL_COMMUNITY)
Admission: EM | Admit: 2022-08-12 | Discharge: 2022-08-13 | DRG: 304 | Disposition: A | Discharge status: Home / Self Care | Payer: Medicare Other | Attending: Internal Medicine | Admitting: Internal Medicine

## 2022-08-12 ENCOUNTER — Emergency Department (HOSPITAL_COMMUNITY): Payer: Medicare Other

## 2022-08-12 ENCOUNTER — Encounter (HOSPITAL_COMMUNITY): Payer: Self-pay | Admitting: Nephrology

## 2022-08-12 DIAGNOSIS — R0902 Hypoxemia: Secondary | ICD-10-CM | POA: Diagnosis present

## 2022-08-12 DIAGNOSIS — K219 Gastro-esophageal reflux disease without esophagitis: Secondary | ICD-10-CM | POA: Diagnosis present

## 2022-08-12 DIAGNOSIS — N186 End stage renal disease: Secondary | ICD-10-CM | POA: Diagnosis present

## 2022-08-12 DIAGNOSIS — I5033 Acute on chronic diastolic (congestive) heart failure: Secondary | ICD-10-CM | POA: Diagnosis present

## 2022-08-12 DIAGNOSIS — D631 Anemia in chronic kidney disease: Secondary | ICD-10-CM | POA: Diagnosis present

## 2022-08-12 DIAGNOSIS — R0602 Shortness of breath: Secondary | ICD-10-CM | POA: Diagnosis present

## 2022-08-12 DIAGNOSIS — R5381 Other malaise: Secondary | ICD-10-CM | POA: Diagnosis present

## 2022-08-12 DIAGNOSIS — R7989 Other specified abnormal findings of blood chemistry: Secondary | ICD-10-CM

## 2022-08-12 DIAGNOSIS — Z992 Dependence on renal dialysis: Secondary | ICD-10-CM | POA: Diagnosis not present

## 2022-08-12 DIAGNOSIS — E875 Hyperkalemia: Secondary | ICD-10-CM | POA: Diagnosis present

## 2022-08-12 DIAGNOSIS — Z8249 Family history of ischemic heart disease and other diseases of the circulatory system: Secondary | ICD-10-CM | POA: Diagnosis not present

## 2022-08-12 DIAGNOSIS — I161 Hypertensive emergency: Secondary | ICD-10-CM | POA: Diagnosis present

## 2022-08-12 DIAGNOSIS — R400 Somnolence: Secondary | ICD-10-CM | POA: Diagnosis present

## 2022-08-12 DIAGNOSIS — Z87891 Personal history of nicotine dependence: Secondary | ICD-10-CM | POA: Diagnosis not present

## 2022-08-12 DIAGNOSIS — Z79899 Other long term (current) drug therapy: Secondary | ICD-10-CM | POA: Diagnosis not present

## 2022-08-12 DIAGNOSIS — E872 Acidosis, unspecified: Secondary | ICD-10-CM | POA: Diagnosis present

## 2022-08-12 DIAGNOSIS — I2489 Other forms of acute ischemic heart disease: Secondary | ICD-10-CM | POA: Diagnosis present

## 2022-08-12 DIAGNOSIS — I132 Hypertensive heart and chronic kidney disease with heart failure and with stage 5 chronic kidney disease, or end stage renal disease: Secondary | ICD-10-CM | POA: Diagnosis present

## 2022-08-12 DIAGNOSIS — E8770 Fluid overload, unspecified: Secondary | ICD-10-CM

## 2022-08-12 DIAGNOSIS — Z940 Kidney transplant status: Secondary | ICD-10-CM

## 2022-08-12 DIAGNOSIS — R079 Chest pain, unspecified: Principal | ICD-10-CM

## 2022-08-12 LAB — TROPONIN I (HIGH SENSITIVITY): Troponin I (High Sensitivity): 117 ng/L (ref <18)

## 2022-08-12 LAB — BASIC METABOLIC PANEL
Anion gap: 18 — ABNORMAL HIGH (ref 5–15)
BUN: 56 mg/dL — ABNORMAL HIGH (ref 6–20)
CO2: 18 mmol/L — ABNORMAL LOW (ref 22–32)
Calcium: 8.5 mg/dL — ABNORMAL LOW (ref 8.9–10.3)
Chloride: 100 mmol/L (ref 98–111)
Creatinine, Ser: 10.97 mg/dL — ABNORMAL HIGH (ref 0.61–1.24)
GFR, Estimated: 5 mL/min — ABNORMAL LOW (ref >60)
Glucose, Bld: 89 mg/dL (ref 70–99)
Potassium: 5.7 mmol/L — ABNORMAL HIGH (ref 3.5–5.1)
Sodium: 136 mmol/L (ref 135–145)

## 2022-08-12 LAB — MAGNESIUM: Magnesium: 2.4 mg/dL (ref 1.7–2.4)

## 2022-08-12 LAB — BRAIN NATRIURETIC PEPTIDE: B Natriuretic Peptide: 434.4 pg/mL — ABNORMAL HIGH (ref 0.0–100.0)

## 2022-08-12 MED ORDER — LORAZEPAM 2 MG/ML IJ SOLN
1.0000 mg | Freq: Once | INTRAMUSCULAR | Status: AC
  Administered 2022-08-12: 1 mg via INTRAVENOUS
  Filled 2022-08-12: qty 1

## 2022-08-12 MED ORDER — NITROGLYCERIN 0.4 MG SL SUBL: 0.4000 mg | SUBLINGUAL_TABLET | SUBLINGUAL | Status: DC | PRN

## 2022-08-12 MED ORDER — ALUM & MAG HYDROXIDE-SIMETH 200-200-20 MG/5ML PO SUSP
30.0000 mL | Freq: Once | ORAL | Status: AC
  Administered 2022-08-12: 30 mL via ORAL
  Filled 2022-08-12: qty 30

## 2022-08-12 MED ORDER — NITROGLYCERIN IN D5W 200-5 MCG/ML-% IV SOLN
0.0000 ug/min | INTRAVENOUS | Status: DC
  Administered 2022-08-12: 15 ug/min via INTRAVENOUS
  Filled 2022-08-12: qty 250

## 2022-08-12 MED ORDER — LIDOCAINE VISCOUS HCL 2 % MT SOLN
15.0000 mL | Freq: Once | OROMUCOSAL | Status: AC
  Administered 2022-08-12: 15 mL via ORAL
  Filled 2022-08-12: qty 15

## 2022-08-12 MED ORDER — LABETALOL HCL 5 MG/ML IV SOLN
10.0000 mg | Freq: Once | INTRAVENOUS | Status: AC
  Administered 2022-08-12: 10 mg via INTRAVENOUS
  Filled 2022-08-12: qty 4

## 2022-08-12 NOTE — ED Notes (Signed)
Patient states that he believes that his blood pressure is high due to stress. RN informed patient that if BP remains elevated we will notify MD.

## 2022-08-12 NOTE — ED Triage Notes (Signed)
Patient here for evaluation of chest pain and shortness of breath that started two days ago. Patient describes the pain as dull under his left ribs, became constant earlier today. Patient is alert and oriented at this time. Dialysis access in chest, MWF schedule, last treatment yesterday.

## 2022-08-12 NOTE — Consult Note (Signed)
Renal Service Consult Note [facility]  Becker Christopher 08/12/2022 Sol Blazing, MD Requesting Physician: Dr. Tomi Bamberger  Reason for Consult: ESRD pt w/ resp distress and uncont HTN HPI: The patient is a 52 y.o. year-old w/ PMH as below who presented to ED for SOB that started a few days ago. Also some L side/ flank pain around the rib cage. Has not missed HD. In ED CXR shows pulm edema and BP's very high around 210 /140. HR 101. K+ 5.7. Creat 10.9.  Pt put on bipap. IV ntg started for HTN. We are asked to see for dialysis.   Pt seen in ED.  Pt's wife is at bedside and provides the history. Pt has been notably SOB w/ exertion for several days, maybe longer. Some SOB when lying down as well. Takes his BP meds and doesn't miss dialysis.   ROS -  n/a due to bipap   Past Medical History  Past Medical History:  Diagnosis Date   Complication of anesthesia    "benadryl knocks me out; worse than any normal reactions; anesthesia/numbing RX wear off FAST" (01/30/2018) 2021 Took 3 weeks to able talk and eat soild food due to throat    ESRD (end stage renal disease) on dialysis ([place])    "HAD DIALYSIS BEFORE TRANSPLANT; RESTARTED DIALYSIS 01/30/2018)-  [facility] - MWF   GERD (gastroesophageal reflux disease)    History of blood transfusion    Hypertension    OSA on CPAP    Pneumonia    Renal disorder    Past Surgical History  Past Surgical History:  Procedure Laterality Date   A/V FISTULAGRAM N/A 02/19/2020   Procedure: A/V FISTULAGRAM;  Surgeon: Serafina Mitchell, MD;  Location: [place] [facility];  Service: Cardiovascular;  Laterality: N/A;   AV FISTULA PLACEMENT Right 2014   AV FISTULA PLACEMENT Right 12/26/2019   Procedure: Repair and Revision of Right Forearm AV Fistula;  Surgeon: Rosetta Posner, MD;  Location: [place];  Service: Vascular;  Laterality: Right;   AV FISTULA PLACEMENT Left 07/29/2020   Procedure: LEFT ARM BRACHIOCEPHALIC ARTERIOVENOUS (AV) FISTULA CREATION;   Surgeon: Waynetta Sandy, MD;  Location: [place];  Service: Vascular;  Laterality: Left;   [place] Left 11/09/2020   Procedure: LEFT BRACHIOCEPHALIC ARTERIOVENOUS FISTULA TRANSPOSITION;  Surgeon: Waynetta Sandy, MD;  Location: [place];  Service: Vascular;  Laterality: Left;   INSERTION OF DIALYSIS CATHETER Left 02/02/2020   Procedure: INSERTION OF LEFT INTERNAL JUGULAR 28 CM TUNNELED DIALYSIS CATHETER UNDER ULTRASOUND GUIDANCE;  Surgeon: Marty Heck, MD;  Location: [place];  Service: Vascular;  Laterality: Left;   KIDNEY TRANSPLANT  2014   at [place] Left 11/09/2020   Procedure: LIGATION OF COMPETING BRANCHES OF LEFT BRACHIOCEPHALIC ARTERIOVENOUS FISTULA;  Surgeon: Waynetta Sandy, MD;  Location: [place];  Service: Vascular;  Laterality: Left;   PARATHYROIDECTOMY  2014   at [place] Right 02/19/2020   Procedure: PERIPHERAL VASCULAR INTERVENTION;  Surgeon: Serafina Mitchell, MD;  Location: [place] [facility];  Service: Cardiovascular;  Laterality: Right;   REVISON OF ARTERIOVENOUS FISTULA Right 05/22/2020   Procedure: REPAIR OF RIGHT ARTERIOVENOUS FISTULA;  Surgeon: Waynetta Sandy, MD;  Location: [facility];  Service: Vascular;  Laterality: Right;   Family History  Family History  Problem Relation Age of Onset   Heart failure Mother    Kidney failure Father  Social History  reports that he has quit smoking. His smoking use included cigars. He has never used smokeless tobacco. He reports current alcohol use of about 2.0 standard drinks of alcohol per week. He reports that he does not currently use drugs. Allergies  Allergies  Allergen Reactions   Chlorhexidine Itching    Burns skin   Other Other (See Comments)    Local anesthesia (quickly metabolizes)   Tape Other (See Comments)    Adhesive tape (irritates skin)   Home medications Prior to Admission  medications   Medication Sig Start Date End Date Taking? Authorizing Provider  acetaminophen (TYLENOL) 500 MG tablet Take 1,000 mg by mouth every 6 (six) hours as needed for mild pain.    [provider]  calcium acetate (PHOSLO) 667 MG capsule Take 1,334-2,668 mg by mouth with breakfast, with lunch, and with evening meal. Take 4 capsules (2,668 mg) by mouth with each meal & take 2 capsules (1,334 mg) by mouth with snacks 12/01/19   [provider]  CIALIS 20 MG tablet Take 20 mg by mouth daily as needed for erectile dysfunction. 08/25/20   [provider]  losartan (COZAAR) 25 MG tablet Take 25 mg by mouth at bedtime. 08/06/19   [provider]  multivitamin (RENA-VIT) TABS tablet Take 1 tablet by mouth every evening.    [provider]  traMADol (ULTRAM) 50 MG tablet Take 1 tablet (50 mg total) by mouth every 6 (six) hours as needed. 11/22/20   Barbie Banner, PA-C     Vitals:   08/12/22 2142 08/12/22 2145 08/12/22 2200 08/12/22 2215  BP: (!) 140/101  (!) 150/127   Pulse: (!) 117 99 94 96  Resp: 12 16 18 17   Temp: 98 F (36.7 C)     TempSrc:      SpO2: 97% 97% 94% 94%   Exam Gen on bipap, asleep, stable No rash, cyanosis or gangrene Sclera anicteric, throat not seen No jvd or bruits Chest clear anterior/ lateral RRR no MRG Abd soft ntnd no mass or ascites +bs GU normal MS no joint effusions or deformity Ext no pitting LE edema, no wounds or ulcers Neuro is on vent, sedated  LIJ TDC in place        OP HD: [facility] MWF  4h 36min  400/500  117kg  1K/ 2.5Ca bath  LIJ TDC  Heparin none - last HD 1/26, post wt 117.7 - hectorol 5 mcg IV tiw - last Hb 11.3, not on esa   Assessment/ Plan: Resp distress/ AHRF - due to vol overload/ pulm edema , uncont HTN most likely ESRD - on HD MWF. Has not missed HD. Plan extra HD tonight.  HTN - uncontrolled, cont IV ntg, when down if possible during HD.  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 Follow.  H/o failed renal transplant - from 2014- 2019, per family, done at [facility]  MD 08/12/2022, 10:24 PM Recent Labs  Lab 08/12/22 1742  CALCIUM 8.5*  CREATININE 10.97*  K 5.7*   Inpatient medications:   sodium chloride     nitroGLYCERIN 15 mcg/min (08/12/22 2046)   sodium chloride, nitroGLYCERIN

## 2022-08-12 NOTE — ED Provider Triage Note (Signed)
Emergency Medicine Provider Triage Evaluation Note  Cap Massi , a 52 y.o. male  was evaluated in triage.  Pt complains of severe left upper abdominal pain.  Pt reports increased pain with taking a deep breath.   Review of Systems  Positive: Pain  Negative: fever  Physical Exam  BP (!) 221/149   Pulse (!) 106   Temp 98.3 F (36.8 C) (Oral)   Resp 20   SpO2 98%  Gen:   Awake, no distress   Resp:  Normal effort  MSK:   Moves extremities without difficulty  Other:  Abdomen distended, tender left upper abdomen    Medical Decision Making  Medically screening exam initiated at 6:00 PM.  Appropriate orders placed.  Edi Gorniak was informed that the remainder of the evaluation will be completed by another provider, this initial triage assessment does not replace that evaluation, and the importance of remaining in the ED until their evaluation is complete.     Fransico Meadow, [place] 08/12/22 8375

## 2022-08-12 NOTE — Progress Notes (Signed)
Transported pt on BIPAP to HD with RN without complications. Report given to 6E RT.

## 2022-08-12 NOTE — ED Provider Notes (Signed)
[facility] Provider Note   CSN: [id number] Arrival date & time: 08/12/22  1730     History  Chief Complaint  Patient presents with   Chest Pain    Jerry [name] is a 52 y.o. male.  Patient with past medical history that is significant for ESRD on dialysis on Monday, Wednesday, Friday schedule presents today for evaluation of 2-day duration of progressively worsening chest pain, and progressed worsening dyspnea with associated orthopnea requiring him to sleep on the recliner as well as PND.  Denies abdominal distention, peripheral edema.  Reports similar episodes to the chest pain in the past which were secondary to acid reflux however he has not had associated shortness of breath in the past.  Denies dialysis session was yesterday.  He received a full session yesterday.  Pain is located in the left chest and does radiate to the left shoulder.  Denies fever, lightheadedness.  The history is provided by the patient. No language interpreter was used.       Home Medications Prior to Admission medications   Medication Sig Start Date End Date Taking? Authorizing Provider  acetaminophen (TYLENOL) 500 MG tablet Take 1,000 mg by mouth every 6 (six) hours as needed for mild pain.    [provider]  calcium acetate (PHOSLO) 667 MG capsule Take 1,334-2,668 mg by mouth with breakfast, with lunch, and with evening meal. Take 4 capsules (2,668 mg) by mouth with each meal & take 2 capsules (1,334 mg) by mouth with snacks 12/01/19   [provider]  CIALIS 20 MG tablet Take 20 mg by mouth daily as needed for erectile dysfunction. 08/25/20   [provider]  losartan (COZAAR) 25 MG tablet Take 25 mg by mouth at bedtime. 08/06/19   [provider]  multivitamin (RENA-VIT) TABS tablet Take 1 tablet by mouth every evening.    [provider]  traMADol (ULTRAM) 50 MG tablet Take 1 tablet (50 mg total) by mouth every  6 (six) hours as needed. 11/22/20   Setzer, Edman Circle, PA-C      Allergies    Chlorhexidine, Other, and Tape    Review of Systems   Review of Systems  Constitutional:  Negative for chills and fever.  Respiratory:  Positive for shortness of breath. Negative for cough.   Cardiovascular:  Positive for chest pain. Negative for palpitations and leg swelling.  Gastrointestinal:  Negative for abdominal pain, nausea and vomiting.  Neurological:  Negative for light-headedness.  All other systems reviewed and are negative.   Physical Exam Updated Vital Signs BP (!) 221/149   Pulse (!) 106   Temp 98.3 F (36.8 C) (Oral)   Resp 20   SpO2 98%  Physical Exam Vitals and nursing note reviewed.  Constitutional:      General: He is in acute distress.     Appearance: Normal appearance. He is not ill-appearing.  HENT:     Head: Normocephalic and atraumatic.     Nose: Nose normal.  Eyes:     General: No scleral icterus.    Extraocular Movements: Extraocular movements intact.     Conjunctiva/sclera: Conjunctivae normal.  Cardiovascular:     Rate and Rhythm: Regular rhythm. Tachycardia present.     Pulses: Normal pulses.  Pulmonary:     Effort: Pulmonary effort is normal. No respiratory distress.     Breath sounds: Rales present. No wheezing.  Abdominal:     General: There is no distension.  Palpations: Abdomen is soft.     Tenderness: There is no abdominal tenderness. There is no guarding.  Musculoskeletal:        General: Normal range of motion.     Cervical back: Normal range of motion.     Right lower leg: No edema.     Left lower leg: No edema.  Skin:    General: Skin is warm and dry.  Neurological:     General: No focal deficit present.     Mental Status: He is alert and oriented to person, place, and time. Mental status is at baseline.     ED Results / Procedures / Treatments   Labs (all labs ordered are listed, but only abnormal results are displayed) Labs Reviewed   MAGNESIUM  BASIC METABOLIC PANEL  CBC  BRAIN NATRIURETIC PEPTIDE  TROPONIN I (HIGH SENSITIVITY)    EKG None  Radiology DG Chest 2 View  Result Date: 08/12/2022 CLINICAL DATA:  Chest pain. EXAM: CHEST - 2 VIEW COMPARISON:  05/23/2020 FINDINGS: Left jugular double-lumen catheter with its tip in the upper right atrium near the superior cavoatrial junction. Enlarged cardiac silhouette with an interval increase in size. Tortuous aorta. Small bilateral pleural effusions, increased. Patchy density in both lower lung zones, greater on the right. Diffusely prominent interstitial markings without Kerley lines. Mildly prominent pulmonary vasculature. Unremarkable bones. IMPRESSION: 1. Cardiomegaly and mild pulmonary vascular congestion. 2. Small bilateral pleural effusions, increased. 3. Patchy atelectasis, pneumonia or alveolar edema in both lower lung zones, greater on the right. 4. Chronic interstitial lung disease. Electronically Signed   By: Claudie Revering M.D.   On: 08/12/2022 18:21    Procedures .Critical Care  Performed by: Evlyn Courier, PA-C Authorized by: Evlyn Courier, PA-C   Critical care provider statement:    Critical care time (minutes):  45   Critical care was necessary to treat or prevent imminent or life-threatening deterioration of the following conditions:  Respiratory failure   Critical care was time spent personally by me on the following activities:  Development of treatment plan with patient or surrogate, discussions with consultants, evaluation of patient's response to treatment, examination of patient, ordering and review of laboratory studies, ordering and review of radiographic studies, ordering and performing treatments and interventions, pulse oximetry, re-evaluation of patient's condition and review of old charts   Care discussed with: admitting provider       Medications Ordered in ED Medications - No data to display  ED Course/ Medical Decision Making/ A&P Clinical  Course as of 08/12/22 2050  Sat Aug 12, 2022  2041 Discussed with nephrologist Dr. Melvia Heaps who will evaluate patient.  Recommends in addition to the nitro drip and BiPAP to give patient a dose of labetalol to assist with blood pressure reduction.  If patient improves we will consider dialysis tonight. [AA]    Clinical Course User Index [AA] Evlyn Courier, PA-C                             Medical Decision Making Amount and/or Complexity of Data Reviewed Labs: ordered. Radiology: ordered.   Medical Decision Making / ED Course   This patient presents to the ED for concern of chest pain, shortness of breath, this involves an extensive number of treatment options, and is a complaint that carries with it a high risk of complications and morbidity.  The differential diagnosis includes CHF exacerbation, volume overload, dialysis noncompliance, acute flash pulmonary edema  MDM: 52 year old male with history of ESRD on dialysis presents today for evaluation of shortness of breath, and chest pain.  Symptoms ongoing for the past couple days however worse tonight.  Hypertensive on arrival.  Chest x-ray with evidence of volume overload.  BNP elevated.  Initially during interview patient symptoms improved and he was relatively comfortable appearing however when I went to reevaluate his symptoms worsen.  Decision made to start patient on nitro drip, BiPAP.  Patient does not make any urine.  Lasix deferred.  Discussed with nephrologist will evaluate patient at bedside and recommends dose of labetalol as well.  He will determine timing for dialysis.  Following nitro drip and BiPAP patient's blood pressure improved, he appears comfortable.  Discussed with hospitalist who will evaluate patient for admission.  CBC has not resulted unsure why.  BMP shows creatinine of 10.97 which is around his baseline, anion gap of 18, potassium of 5.7.  Without acute hyperkalemic EKG changes.  EKG without acute ischemic changes.  BNP  of 434.  Troponin initially 117.  Likely demand in setting of hypertensive emergency, and volume overload.  Discussed with hospitalist will evaluate patient for admission.  Lab Tests: -I ordered, reviewed, and interpreted labs.   The pertinent results include:   Labs Reviewed  BASIC METABOLIC PANEL - Abnormal; Notable for the following components:      Result Value   Potassium 5.7 (*)    CO2 18 (*)    BUN 56 (*)    Creatinine, Ser 10.97 (*)    Calcium 8.5 (*)    GFR, Estimated 5 (*)    Anion gap 18 (*)    All other components within normal limits  BRAIN NATRIURETIC PEPTIDE - Abnormal; Notable for the following components:   B Natriuretic Peptide 434.4 (*)    All other components within normal limits  TROPONIN I (HIGH SENSITIVITY) - Abnormal; Notable for the following components:   Troponin I (High Sensitivity) 117 (*)    All other components within normal limits  MAGNESIUM  CBC WITH DIFFERENTIAL/PLATELET  HEPATITIS B SURFACE ANTIGEN  HEPATITIS B SURFACE ANTIBODY, QUANTITATIVE  TROPONIN I (HIGH SENSITIVITY)      EKG  EKG Interpretation  Date/Time:  Saturday August 12 2022 17:31:20 EST Ventricular Rate:  109 PR Interval:  172 QRS Duration: 88 QT Interval:  374 QTC Calculation: 503 R Axis:   78 Text Interpretation: Sinus tachycardia with Premature atrial complexes Right atrial enlargement Nonspecific ST abnormality Abnormal ECG When compared with ECG of 23-May-2020 13:43, Since last tracing rate faster Confirmed by Dorie Rank [phone]) on 08/12/2022 8:48:39 PM         Imaging Studies ordered: I ordered imaging studies including chest x-ray I independently visualized and interpreted imaging. I agree with the radiologist interpretation   Medicines ordered and prescription drug management: Meds ordered this encounter  Medications   AND Linked Order Group    alum & mag hydroxide-simeth (MAALOX/MYLANTA) 200-200-20 MG/5ML suspension 30 mL    lidocaine (XYLOCAINE) 2 %  viscous mouth solution 15 mL   nitroGLYCERIN (NITROSTAT) SL tablet 0.4 mg   nitroGLYCERIN 50 mg in dextrose 5 % 250 mL (0.2 mg/mL) infusion   labetalol (NORMODYNE) injection 10 mg   LORazepam (ATIVAN) injection 1 mg    -I have reviewed the patients home medicines and have made adjustments as needed  Critical interventions Nitro drip, BiPAP  Consultations Obtained: I requested consultation with the nephrologist,  and discussed lab and imaging findings as well as pertinent  plan - they recommend: They will evaluate patient for further recommendations   Cardiac Monitoring: The patient was maintained on a cardiac monitor.  I personally viewed and interpreted the cardiac monitored which showed an underlying rhythm of: Sinus tachycardia  Reevaluation: After the interventions noted above, I reevaluated the patient and found that they have :improved  Co morbidities that complicate the patient evaluation  Past Medical History:  Diagnosis Date   Complication of anesthesia    "benadryl knocks me out; worse than any normal reactions; anesthesia/numbing RX wear off FAST" (01/30/2018) 2021 Took 3 weeks to able talk and eat soild food due to throat    ESRD (end stage renal disease) on dialysis ([place])    "HAD DIALYSIS BEFORE TRANSPLANT; RESTARTED DIALYSIS 01/30/2018)-  [facility]   GERD (gastroesophageal reflux disease)    History of blood transfusion    Hypertension    OSA on CPAP    Pneumonia       Dispostion: Patient discussed with hospitalist who will evaluate patient for admission.  Final Clinical Impression(s) / ED Diagnoses Final diagnoses:  Chest pain, unspecified type  Hypertensive emergency  Hypervolemia, unspecified hypervolemia type  ESRD (end stage renal disease) ([place])  Elevated troponin    Rx / DC Orders ED Discharge Orders     None         Evlyn Courier, PA-C 08/12/22 2328    Dorie Rank, MD 08/12/22 2342

## 2022-08-13 ENCOUNTER — Inpatient Hospital Stay (HOSPITAL_COMMUNITY): Payer: Medicare Other

## 2022-08-13 DIAGNOSIS — I161 Hypertensive emergency: Secondary | ICD-10-CM | POA: Diagnosis not present

## 2022-08-13 LAB — CBC
HCT: 38.1 % — ABNORMAL LOW (ref 39.0–52.0)
Hemoglobin: 11.8 g/dL — ABNORMAL LOW (ref 13.0–17.0)
MCH: 29.5 pg (ref 26.0–34.0)
MCHC: 31 g/dL (ref 30.0–36.0)
MCV: 95.3 fL (ref 80.0–100.0)
Platelets: 323 10*3/uL (ref 150–400)
RBC: 4 MIL/uL — ABNORMAL LOW (ref 4.22–5.81)
RDW: 14.4 % (ref 11.5–15.5)
WBC: 6.9 10*3/uL (ref 4.0–10.5)
nRBC: 0 % (ref 0.0–0.2)

## 2022-08-13 LAB — RENAL FUNCTION PANEL
Albumin: 4 g/dL (ref 3.5–5.0)
Anion gap: 16 — ABNORMAL HIGH (ref 5–15)
BUN: 31 mg/dL — ABNORMAL HIGH (ref 6–20)
CO2: 26 mmol/L (ref 22–32)
Calcium: 8.7 mg/dL — ABNORMAL LOW (ref 8.9–10.3)
Chloride: 93 mmol/L — ABNORMAL LOW (ref 98–111)
Creatinine, Ser: 7.39 mg/dL — ABNORMAL HIGH (ref 0.61–1.24)
GFR, Estimated: 8 mL/min — ABNORMAL LOW (ref >60)
Glucose, Bld: 94 mg/dL (ref 70–99)
Phosphorus: 4.8 mg/dL — ABNORMAL HIGH (ref 2.5–4.6)
Potassium: 3.5 mmol/L (ref 3.5–5.1)
Sodium: 135 mmol/L (ref 135–145)

## 2022-08-13 LAB — HEPATITIS B SURFACE ANTIGEN: Hepatitis B Surface Ag: NONREACTIVE

## 2022-08-13 LAB — TROPONIN I (HIGH SENSITIVITY): Troponin I (High Sensitivity): 510 ng/L (ref <18)

## 2022-08-13 MED ORDER — HEPARIN SODIUM (PORCINE) 5000 UNIT/ML IJ SOLN
5000.0000 [IU] | Freq: Three times a day (TID) | INTRAMUSCULAR | Status: DC
  Administered 2022-08-13: 5000 [IU] via SUBCUTANEOUS
  Filled 2022-08-13: qty 1

## 2022-08-13 MED ORDER — RENA-VITE PO TABS
1.0000 | ORAL_TABLET | Freq: Every evening | ORAL | Status: DC
  Filled 2022-08-13: qty 1

## 2022-08-13 MED ORDER — HEPARIN SODIUM (PORCINE) 1000 UNIT/ML IJ SOLN
INTRAMUSCULAR | Status: AC
  Filled 2022-08-13: qty 1

## 2022-08-13 NOTE — H&P (Addendum)
History and Physical  Jerry [name] ZOX:[id number] DOB: [birth year] DOA: 08/12/2022  Referring physician: Salomon Fick  PCP: Patient, No Pcp Per  Outpatient Specialists: Nephrology Patient coming from: Home.  Chief Complaint: Shortness of breath, chest pain.  HPI: Jerry [name] is a 52 y.o. male with medical history significant for ESRD on HD MWF with hemodialysis compliance, last hemodialysis was on Friday, 08/11/2022, hypertension, anemia of chronic disease, who presented to [facility] ED from home due to chest pain and shortness of breath for 2 days, gradually worsening.  In the ED, first set of troponin elevated 117 and uptrending 510.  Severely hypertensive and started on nitro drip with improvement of BP.  Pulmonary edema on chest x-ray.  Hyperkalemia.  EDP discussed the case with nephrology.  Decision was made to hemodialyze overnight.  At the time of the visit the patient is on BiPAP, somnolent but arousable to voices.  The patient was admitted by [facility], hospitalist service.  ED Course: Tmax 98.3.  BP 129/95.  Pulse 121, respiratory 14, O2 saturation 100% on 4 L.  Lab studies significant for high-sensitivity troponin 117, 510.  Potassium 5.7, serum bicarb 18, BUN 56, creatinine 10.97.  Hemoglobin 11.9.  Review of Systems: Review of systems as noted in the HPI. All other systems reviewed and are negative.   Past Medical History:  Diagnosis Date   Complication of anesthesia    "benadryl knocks me out; worse than any normal reactions; anesthesia/numbing RX wear off FAST" (01/30/2018) 2021 Took 3 weeks to able talk and eat soild food due to throat    ESRD (end stage renal disease) on dialysis ([place])    "HAD DIALYSIS BEFORE TRANSPLANT; RESTARTED DIALYSIS 01/30/2018)-  [facility] - MWF   GERD (gastroesophageal reflux disease)    History of blood transfusion    Hypertension    OSA on CPAP    Pneumonia    Past Surgical History:  Procedure Laterality Date   A/V FISTULAGRAM N/A 02/19/2020    Procedure: A/V FISTULAGRAM;  Surgeon: Serafina Mitchell, MD;  Location: [place] [facility];  Service: Cardiovascular;  Laterality: N/A;   AV FISTULA PLACEMENT Right 2014   AV FISTULA PLACEMENT Right 12/26/2019   Procedure: Repair and Revision of Right Forearm AV Fistula;  Surgeon: Rosetta Posner, MD;  Location: [place];  Service: Vascular;  Laterality: Right;   AV FISTULA PLACEMENT Left 07/29/2020   Procedure: LEFT ARM BRACHIOCEPHALIC ARTERIOVENOUS (AV) FISTULA CREATION;  Surgeon: Waynetta Sandy, MD;  Location: [place];  Service: Vascular;  Laterality: Left;   [place] Left 11/09/2020   Procedure: LEFT BRACHIOCEPHALIC ARTERIOVENOUS FISTULA TRANSPOSITION;  Surgeon: Waynetta Sandy, MD;  Location: [place];  Service: Vascular;  Laterality: Left;   INSERTION OF DIALYSIS CATHETER Left 02/02/2020   Procedure: INSERTION OF LEFT INTERNAL JUGULAR 28 CM TUNNELED DIALYSIS CATHETER UNDER ULTRASOUND GUIDANCE;  Surgeon: Marty Heck, MD;  Location: [place];  Service: Vascular;  Laterality: Left;   KIDNEY TRANSPLANT  2014   at [place] Left 11/09/2020   Procedure: LIGATION OF COMPETING BRANCHES OF LEFT BRACHIOCEPHALIC ARTERIOVENOUS FISTULA;  Surgeon: Waynetta Sandy, MD;  Location: [place];  Service: Vascular;  Laterality: Left;   PARATHYROIDECTOMY  2014   at [place] Right 02/19/2020   Procedure: PERIPHERAL VASCULAR INTERVENTION;  Surgeon: Serafina Mitchell, MD;  Location: [place] [facility];  Service: Cardiovascular;  Laterality: Right;   REVISON OF ARTERIOVENOUS  FISTULA Right 05/22/2020   Procedure: REPAIR OF RIGHT ARTERIOVENOUS FISTULA;  Surgeon: Waynetta Sandy, MD;  Location: [place];  Service: Vascular;  Laterality: Right;    Social History:  reports that he has quit smoking. His smoking use included cigars. He has never used smokeless tobacco. He reports current alcohol use  of about 2.0 standard drinks of alcohol per week. He reports that he does not currently use drugs.   Allergies  Allergen Reactions   Chlorhexidine Itching    Burns skin   Other Other (See Comments)    Local anesthesia (quickly metabolizes)   Tape Other (See Comments)    Adhesive tape (irritates skin)    Family History  Problem Relation Age of Onset   Heart failure Mother    Kidney failure Father       Prior to Admission medications   Medication Sig Start Date End Date Taking? Authorizing Provider  acetaminophen (TYLENOL) 500 MG tablet Take 1,000 mg by mouth every 6 (six) hours as needed for mild pain.    [provider]  calcium acetate (PHOSLO) 667 MG capsule Take 1,334-2,668 mg by mouth with breakfast, with lunch, and with evening meal. Take 4 capsules (2,668 mg) by mouth with each meal & take 2 capsules (1,334 mg) by mouth with snacks 12/01/19   [provider]  CIALIS 20 MG tablet Take 20 mg by mouth daily as needed for erectile dysfunction. 08/25/20   [provider]  losartan (COZAAR) 25 MG tablet Take 25 mg by mouth at bedtime. 08/06/19   [provider]  multivitamin (RENA-VIT) TABS tablet Take 1 tablet by mouth every evening.    [provider]  traMADol (ULTRAM) 50 MG tablet Take 1 tablet (50 mg total) by mouth every 6 (six) hours as needed. 11/22/20   Setzer, Edman Circle, PA-C    Physical Exam: BP (!) 128/99 (BP Location: Right Arm)   Pulse 99   Temp 97.8 F (36.6 C) (Oral)   Resp 18   SpO2 100%   General: 52 y.o. year-old male well developed well nourished in no acute distress.  Somnolent but arousable to voice. Cardiovascular: Regular rate and rhythm with no rubs or gallops.  No thyromegaly or JVD noted.  No lower extremity edema. 2/4 pulses in all 4 extremities. Respiratory: Diffuse rales bilaterally no wheezing noted.  Poor inspiratory effort. Abdomen: Soft nontender nondistended with normal bowel sounds x4  quadrants. Muskuloskeletal: No cyanosis, clubbing or edema noted bilaterally Neuro: CN II-XII intact, strength, sensation, reflexes Skin: No ulcerative lesions noted or rashes Psychiatry: Unable to assess judgment or mood due to somnolence.         Labs on Admission:  Basic Metabolic Panel: Recent Labs  Lab 08/12/22 1742 08/12/22 1803  NA 136  --   K 5.7*  --   CL 100  --   CO2 18*  --   GLUCOSE 89  --   BUN 56*  --   CREATININE 10.97*  --   CALCIUM 8.5*  --   MG  --  2.4   Liver Function Tests: No results for input(s): "AST", "ALT", "ALKPHOS", "BILITOT", "PROT", "ALBUMIN" in the last 168 hours. No results for input(s): "LIPASE", "AMYLASE" in the last 168 hours. No results for input(s): "AMMONIA" in the last 168 hours. CBC: Recent Labs  Lab 08/13/22 0507  WBC 6.9  HGB 11.8*  HCT 38.1*  MCV 95.3  PLT 323   Cardiac Enzymes: No results for input(s): "CKTOTAL", "CKMB", "  CKMBINDEX", "TROPONINI" in the last 168 hours.  BNP (last 3 results) Recent Labs    08/12/22 1803  BNP 434.4*    ProBNP (last 3 results) No results for input(s): "PROBNP" in the last 8760 hours.  CBG: No results for input(s): "GLUCAP" in the last 168 hours.  Radiological Exams on Admission: DG Chest 2 View  Result Date: 08/12/2022 CLINICAL DATA:  Chest pain. EXAM: CHEST - 2 VIEW COMPARISON:  05/23/2020 FINDINGS: Left jugular double-lumen catheter with its tip in the upper right atrium near the superior cavoatrial junction. Enlarged cardiac silhouette with an interval increase in size. Tortuous aorta. Small bilateral pleural effusions, increased. Patchy density in both lower lung zones, greater on the right. Diffusely prominent interstitial markings without Kerley lines. Mildly prominent pulmonary vasculature. Unremarkable bones. IMPRESSION: 1. Cardiomegaly and mild pulmonary vascular congestion. 2. Small bilateral pleural effusions, increased. 3. Patchy atelectasis, pneumonia or alveolar edema in  both lower lung zones, greater on the right. 4. Chronic interstitial lung disease. Electronically Signed   By: Claudie Revering M.D.   On: 08/12/2022 18:21    EKG: I independently viewed the EKG done and my findings are as followed: Sinus tachycardia rate of 109.  Nonspecific ST-T changes.  QTc 503.  Assessment/Plan Present on Admission:  Hypertensive emergency  Principal Problem:   Hypertensive emergency  Hypertensive emergency Presented with severely elevated BPs with flash pulm edema Urgent hemodialysis on the night of 08/12/2022 BP is currently at goal fluids Closely monitor vital signs.  Acute hypoxic respiratory failure secondary to pulmonary edema Not on oxygen supplementation at baseline Initially on BiPAP Downgraded to 4 L nasal cannula Wean off O2 supplementation as tolerated Maintain O2 saturation greater than 90%  ESRD on HD MWF Compliant with his hemodialysis Defer management to nephrology Electrolytes and volume status managed with hemodialysis.  Elevated troponin, suspect demand ischemia in the setting of hypertensive emergency Troponin uptrending from 117 to 510. Repeat high-sensitivity troponin after hemodialysis Follow 2D echo  Acute on chronic diastolic CHF BNP 631, pulmonary edema Mild peripheral edema Follow 2D echo Volume status managed with hemodialysis.  Hyperkalemia Home losartan on hold due to hyperkalemia Repeat renal function panel  Mild anion gap metabolic acidosis Serum bicarb 18, anion gap 18 Repeat renal function panel after hemodialysis  Physical debility PT OT assessment Fall precautions      DVT prophylaxis: Subcu heparin 3 times daily  Code Status: Full code  Family Communication: None at bedside  Disposition Plan: Admitted to progressive care unit  Consults called: Nephrology  Admission status: Inpatient status.     Kayleen Memos MD [facility] Pager [phone]  If 7PM-7AM, please contact  night-coverage [email or web address] Password [facility]  08/13/2022, 6:12 AM

## 2022-08-13 NOTE — Discharge Summary (Signed)
Physician Discharge Summary  Jerry [name] [id number] DOB: 09-Jul-1971 DOA: 08/12/2022  PCP: Patient, No Pcp Per  Admit date: 08/12/2022 Discharge date: 08/13/2022  Admitted From: Home Disposition:  Home  Recommendations for Outpatient Follow-up:  Follow up with PCP in 1-2 weeks Continue follow-up with nephrology as scheduled  Home Health: None Equipment/Devices: None  Discharge Condition: Stable CODE STATUS: Full Diet recommendation: Renal diet  Brief/Interim Summary: Jerry [name] is a 52 y.o. male with medical history significant for ESRD on HD MWF with hemodialysis compliance, last hemodialysis was on Friday, 08/11/2022, hypertension, anemia of chronic disease, who presented to [facility] ED from home due to chest pain and shortness of breath for 2 days, gradually worsening over the weekend.  Hospitalist called for admission, nephrology called for urgent dialysis given volume overload.  Patient admitted as above with acute respiratory distress in the setting of pleural effusion and volume overload likely secondary to incomplete dialysis.  Patient states that he feels his unit is "not drawing enough fluid off" which subsequently culminated in his admission with dyspnea, hypoxia and supply/demand mismatch with elevated troponin and chest pain.  After dialysis patient immediately felt better, no further symptoms of orthopnea, dyspnea or chest pain.  At this time given patient's rapid improvement will discharge home for close follow-up tomorrow 08/14/2022 at routine dialysis slot.  Discussed with patient the need to ensure his new needs are discussed with his unit, nephrology here is aware.  Concerned that patient has lost weight in the setting of fat or muscle - as such his new dry weight needs to be calculated.  Discharge Diagnoses:  Principal Problem:   Hypertensive emergency    Discharge Instructions  Discharge Instructions     Call MD for:  difficulty breathing, headache or  visual disturbances   Complete by: As directed    Call MD for:  extreme fatigue   Complete by: As directed    Call MD for:  persistant dizziness or light-headedness   Complete by: As directed    Call MD for:  persistant nausea and vomiting   Complete by: As directed    Call MD for:  redness, tenderness, or signs of infection (pain, swelling, redness, odor or green/yellow discharge around incision site)   Complete by: As directed    Call MD for:  severe uncontrolled pain   Complete by: As directed    Call MD for:  temperature >100.4   Complete by: As directed    Discharge instructions   Complete by: As directed    Continue dialysis Monday Wednesday Friday, discuss with staff at dialysis about changing protocol given volume overloaded status causing profound dyspnea hypoxia chest pain here in the hospital.  If recurrent issues symptoms return please report back to the ED as necessary.   Increase activity slowly   Complete by: As directed       Allergies as of 08/13/2022       Reactions   Chlorhexidine Itching   Burns skin   Other Other (See Comments)   Local anesthesia (quickly metabolizes)   Tape Other (See Comments)   Adhesive tape (irritates skin)        Medication List     TAKE these medications    acetaminophen 500 MG tablet Commonly known as: TYLENOL Take 1,000 mg by mouth every 6 (six) hours as needed for mild pain.   calcium acetate 667 MG capsule Commonly known as: PHOSLO Take 1,334-2,668 mg by mouth with breakfast, with lunch, and with evening meal.  Take 4 capsules (2,668 mg) by mouth with each meal & take 2 capsules (1,334 mg) by mouth with snacks   losartan 25 MG tablet Commonly known as: COZAAR Take 25 mg by mouth at bedtime.   multivitamin Tabs tablet Take 1 tablet by mouth every evening.        Allergies  Allergen Reactions   Chlorhexidine Itching    Burns skin   Other Other (See Comments)    Local anesthesia (quickly metabolizes)   Tape  Other (See Comments)    Adhesive tape (irritates skin)    Consultations: Nephrology  Procedures/Studies: DG Chest 2 View  Result Date: 08/12/2022 CLINICAL DATA:  Chest pain. EXAM: CHEST - 2 VIEW COMPARISON:  05/23/2020 FINDINGS: Left jugular double-lumen catheter with its tip in the upper right atrium near the superior cavoatrial junction. Enlarged cardiac silhouette with an interval increase in size. Tortuous aorta. Small bilateral pleural effusions, increased. Patchy density in both lower lung zones, greater on the right. Diffusely prominent interstitial markings without Kerley lines. Mildly prominent pulmonary vasculature. Unremarkable bones. IMPRESSION: 1. Cardiomegaly and mild pulmonary vascular congestion. 2. Small bilateral pleural effusions, increased. 3. Patchy atelectasis, pneumonia or alveolar edema in both lower lung zones, greater on the right. 4. Chronic interstitial lung disease. Electronically Signed   By: Claudie Revering M.D.   On: 08/12/2022 18:21     Subjective: No acute issues or events overnight, tolerated dialysis well, now feels back to baseline if not "better than before" otherwise stable and agreeable for discharge denies nausea vomiting diarrhea constipation headache fevers chills shortness of breath or chest pain   Discharge Exam: Vitals:   08/13/22 1152 08/13/22 1245  BP:  117/89  Pulse:  100  Resp:  16  Temp: 98 F (36.7 C)   SpO2:  98%   Vitals:   08/13/22 0815 08/13/22 0900 08/13/22 1152 08/13/22 1245  BP: 101/72 113/85  117/89  Pulse: (!) 111 (!) 102  100  Resp: 15 14  16   Temp:   98 F (36.7 C)   TempSrc:      SpO2: 96% 97%  98%    General: Pt is alert, awake, not in acute distress Cardiovascular: RRR, S1/S2 +, no rubs, no gallops Respiratory: CTA bilaterally, no wheezing, no rhonchi Abdominal: Soft, NT, ND, bowel sounds + Extremities: no edema, no cyanosis    The results of significant diagnostics from this hospitalization (including  imaging, microbiology, ancillary and laboratory) are listed below for reference.     Microbiology: No results found for this or any previous visit (from the past 240 hour(s)).   Labs: BNP (last 3 results) Recent Labs    08/12/22 1803  BNP 381.8*   Basic Metabolic Panel: Recent Labs  Lab 08/12/22 1742 08/12/22 1803 08/13/22 0700  NA 136  --  135  K 5.7*  --  3.5  CL 100  --  93*  CO2 18*  --  26  GLUCOSE 89  --  94  BUN 56*  --  31*  CREATININE 10.97*  --  7.39*  CALCIUM 8.5*  --  8.7*  MG  --  2.4  --   PHOS  --   --  4.8*   Liver Function Tests: Recent Labs  Lab 08/13/22 0700  ALBUMIN 4.0   No results for input(s): "LIPASE", "AMYLASE" in the last 168 hours. No results for input(s): "AMMONIA" in the last 168 hours. CBC: Recent Labs  Lab 08/13/22 0507  WBC 6.9  HGB 11.8*  HCT 38.1*  MCV 95.3  PLT 323   Sepsis Labs Recent Labs  Lab 08/13/22 0507  WBC 6.9   Microbiology No results found for this or any previous visit (from the past 240 hour(s)).   Time coordinating discharge: Over 30 minutes  SIGNED:   Little Ishikawa, DO [facility] 08/13/2022, 1:23 PM Pager   If 7PM-7AM, please contact night-coverage [email or web address]

## 2022-08-13 NOTE — Progress Notes (Signed)
[facility] Progress Note  Subjective: feels much better, going home. He found an ED scale and weighed standing at 114.7kg.   Vitals:   08/13/22 0815 08/13/22 0900 08/13/22 1152 08/13/22 1245  BP: 101/72 113/85  117/89  Pulse: (!) 111 (!) 102  100  Resp: 15 14  16   Temp:   98 F (36.7 C)   TempSrc:      SpO2: 96% 97%  98%    Exam: Gen alert and on RA, stable, no ^wob No jvd or bruits Chest clear anterior/ lateral RRR no MRG Abd soft ntnd no mass or ascites +bs Ext no LE edema Neuro Ox 3  LIJ TDC in place          OP HD: SW MWF  4h 35min  400/500  117kg  1K/ 2.5Ca bath  LIJ TDC  Heparin none - last HD 1/26, post wt 117.7 - hectorol 5 mcg IV tiw - last Hb 11.3, not on esa     Assessment/ Plan: Resp distress/ AHRF - due to vol overload/ pulm edema , uncont HTN. Resolved w/ medications and HD x1 last night w/ 3.2 L UF. Weighed just now in ED at 114.7kg -- > will set new dry wt at 114.5kg. Pt knows to go to his regular HD tomorrow.  ESRD - on HD MWF. Has not missed and OP HD. Had HD here overnight, for dc today. Will do his OP HD tomorrow.   HTN - uncontrolled in ED, better now, resolved.  Anemia esrd - CBC is pending, not on esa at OP unit MBD ckd - Ca in range, add on phos. Follow.  H/o failed renal transplant - from 2014- 2019, per family, done at [place] 08/13/2022, 2:38 PM   Recent Labs  Lab 08/12/22 1742 08/13/22 0507 08/13/22 0700  HGB  --  11.8*  --   ALBUMIN  --   --  4.0  CALCIUM 8.5*  --  8.7*  PHOS  --   --  4.8*  CREATININE 10.97*  --  7.39*  K 5.7*  --  3.5   No results for input(s): "IRON", "TIBC", "FERRITIN" in the last 168 hours. Inpatient medications:  heparin  5,000 Units Subcutaneous Q8H   heparin sodium (porcine)       multivitamin  1 tablet Oral QPM    sodium chloride     sodium chloride, heparin sodium (porcine), nitroGLYCERIN

## 2022-08-13 NOTE — ED Notes (Signed)
Patients girlfriend Keturah Barre had to be asked to leave to wait for boyfriend due to room need. She was informed that when he returned to his room she would be allowed back in as she did not leave per her request.  Her contact number is [phone]

## 2022-08-13 NOTE — ED Notes (Signed)
Patient ambulatory, no s/s of any distress. Patient denies pain or discomfort. VSS

## 2022-08-13 NOTE — ED Notes (Signed)
Patient informed that girlfriend had to be sent to the waiting area.

## 2022-08-14 ENCOUNTER — Telehealth (HOSPITAL_COMMUNITY): Payer: Self-pay | Admitting: Nephrology

## 2022-08-14 LAB — HEPATITIS B SURFACE ANTIBODY, QUANTITATIVE: Hep B S AB Quant (Post): 39.5 m[IU]/mL (ref >9.9)

## 2022-08-14 NOTE — Telephone Encounter (Signed)
Transition of care contact from inpatient facility  Date of Discharge: 08/12/22 Date of Contact: 08/14/22 - attempted Method of contact: Phone  Attempted to contact patient to discuss transition of care from inpatient admission. Patient did not answer the phone. Message was left on the patient's voicemail with call back number [phone].   [name], PA-C [employer] Pager [phone]

## 2022-08-16 ENCOUNTER — Inpatient Hospital Stay (HOSPITAL_COMMUNITY): Payer: Medicare Other

## 2022-08-16 ENCOUNTER — Emergency Department (HOSPITAL_COMMUNITY): Payer: Medicare Other

## 2022-08-16 ENCOUNTER — Other Ambulatory Visit: Payer: Self-pay

## 2022-08-16 ENCOUNTER — Encounter (HOSPITAL_COMMUNITY): Payer: Self-pay | Admitting: Emergency Medicine

## 2022-08-16 ENCOUNTER — Inpatient Hospital Stay (HOSPITAL_COMMUNITY)
Admission: EM | Admit: 2022-08-16 | Discharge: 2022-08-17 | DRG: 280 | Disposition: A | Discharge status: Home / Self Care | Payer: Medicare Other | Attending: Family Medicine | Admitting: Family Medicine

## 2022-08-16 DIAGNOSIS — G4733 Obstructive sleep apnea (adult) (pediatric): Secondary | ICD-10-CM | POA: Diagnosis present

## 2022-08-16 DIAGNOSIS — Z8249 Family history of ischemic heart disease and other diseases of the circulatory system: Secondary | ICD-10-CM | POA: Diagnosis not present

## 2022-08-16 DIAGNOSIS — D631 Anemia in chronic kidney disease: Secondary | ICD-10-CM | POA: Diagnosis present

## 2022-08-16 DIAGNOSIS — Z841 Family history of disorders of kidney and ureter: Secondary | ICD-10-CM | POA: Diagnosis not present

## 2022-08-16 DIAGNOSIS — N2581 Secondary hyperparathyroidism of renal origin: Secondary | ICD-10-CM | POA: Diagnosis present

## 2022-08-16 DIAGNOSIS — I214 Non-ST elevation (NSTEMI) myocardial infarction: Secondary | ICD-10-CM | POA: Diagnosis not present

## 2022-08-16 DIAGNOSIS — N186 End stage renal disease: Secondary | ICD-10-CM | POA: Diagnosis present

## 2022-08-16 DIAGNOSIS — I132 Hypertensive heart and chronic kidney disease with heart failure and with stage 5 chronic kidney disease, or end stage renal disease: Secondary | ICD-10-CM | POA: Diagnosis present

## 2022-08-16 DIAGNOSIS — K625 Hemorrhage of anus and rectum: Secondary | ICD-10-CM | POA: Diagnosis present

## 2022-08-16 DIAGNOSIS — Z91048 Other nonmedicinal substance allergy status: Secondary | ICD-10-CM | POA: Diagnosis not present

## 2022-08-16 DIAGNOSIS — I5021 Acute systolic (congestive) heart failure: Secondary | ICD-10-CM | POA: Diagnosis present

## 2022-08-16 DIAGNOSIS — Z6837 Body mass index (BMI) 37.0-37.9, adult: Secondary | ICD-10-CM

## 2022-08-16 DIAGNOSIS — Z87891 Personal history of nicotine dependence: Secondary | ICD-10-CM | POA: Diagnosis not present

## 2022-08-16 DIAGNOSIS — E875 Hyperkalemia: Secondary | ICD-10-CM | POA: Diagnosis present

## 2022-08-16 DIAGNOSIS — Z888 Allergy status to other drugs, medicaments and biological substances status: Secondary | ICD-10-CM | POA: Diagnosis not present

## 2022-08-16 DIAGNOSIS — K219 Gastro-esophageal reflux disease without esophagitis: Secondary | ICD-10-CM | POA: Diagnosis present

## 2022-08-16 DIAGNOSIS — T8612 Kidney transplant failure: Secondary | ICD-10-CM | POA: Diagnosis present

## 2022-08-16 DIAGNOSIS — Z992 Dependence on renal dialysis: Secondary | ICD-10-CM | POA: Diagnosis not present

## 2022-08-16 DIAGNOSIS — E669 Obesity, unspecified: Secondary | ICD-10-CM | POA: Diagnosis present

## 2022-08-16 DIAGNOSIS — Z79899 Other long term (current) drug therapy: Secondary | ICD-10-CM

## 2022-08-16 LAB — CBC
HCT: 37.3 % — ABNORMAL LOW (ref 39.0–52.0)
HCT: 41.1 % (ref 39.0–52.0)
Hemoglobin: 11.7 g/dL — ABNORMAL LOW (ref 13.0–17.0)
Hemoglobin: 13 g/dL (ref 13.0–17.0)
MCH: 30.4 pg (ref 26.0–34.0)
MCH: 29.8 pg (ref 26.0–34.0)
MCHC: 31.4 g/dL (ref 30.0–36.0)
MCHC: 31.6 g/dL (ref 30.0–36.0)
MCV: 96.9 fL (ref 80.0–100.0)
MCV: 94.3 fL (ref 80.0–100.0)
Platelets: 302 10*3/uL (ref 150–400)
Platelets: 344 10*3/uL (ref 150–400)
RBC: 3.85 MIL/uL — ABNORMAL LOW (ref 4.22–5.81)
RBC: 4.36 MIL/uL (ref 4.22–5.81)
RDW: 14.7 % (ref 11.5–15.5)
RDW: 14.5 % (ref 11.5–15.5)
WBC: 7.3 10*3/uL (ref 4.0–10.5)
WBC: 8 10*3/uL (ref 4.0–10.5)
nRBC: 0 % (ref 0.0–0.2)
nRBC: 0 % (ref 0.0–0.2)

## 2022-08-16 LAB — BRAIN NATRIURETIC PEPTIDE: B Natriuretic Peptide: 243.1 pg/mL — ABNORMAL HIGH (ref 0.0–100.0)

## 2022-08-16 LAB — LIPID PANEL
Cholesterol: 161 mg/dL (ref 0–200)
HDL: 42 mg/dL (ref >40)
LDL Cholesterol: 97 mg/dL (ref 0–99)
Total CHOL/HDL Ratio: 3.8 RATIO
Triglycerides: 108 mg/dL (ref <150)
VLDL: 22 mg/dL (ref 0–40)

## 2022-08-16 LAB — BASIC METABOLIC PANEL
Anion gap: 16 — ABNORMAL HIGH (ref 5–15)
BUN: 63 mg/dL — ABNORMAL HIGH (ref 6–20)
CO2: 23 mmol/L (ref 22–32)
Calcium: 9.5 mg/dL (ref 8.9–10.3)
Chloride: 97 mmol/L — ABNORMAL LOW (ref 98–111)
Creatinine, Ser: 10.83 mg/dL — ABNORMAL HIGH (ref 0.61–1.24)
GFR, Estimated: 5 mL/min — ABNORMAL LOW (ref >60)
Glucose, Bld: 105 mg/dL — ABNORMAL HIGH (ref 70–99)
Potassium: 4.2 mmol/L (ref 3.5–5.1)
Sodium: 136 mmol/L (ref 135–145)

## 2022-08-16 LAB — TROPONIN I (HIGH SENSITIVITY)
Troponin I (High Sensitivity): 786 ng/L (ref <18)
Troponin I (High Sensitivity): 697 ng/L (ref <18)
Troponin I (High Sensitivity): 471 ng/L (ref <18)

## 2022-08-16 LAB — IRON AND TIBC
Iron: 35 ug/dL — ABNORMAL LOW (ref 45–182)
Saturation Ratios: 13 % — ABNORMAL LOW (ref 17.9–39.5)
TIBC: 276 ug/dL (ref 250–450)
UIBC: 241 ug/dL

## 2022-08-16 LAB — ECHOCARDIOGRAM COMPLETE
S' Lateral: 4.2 cm
Single Plane A4C EF: 49 %

## 2022-08-16 LAB — HEMOGLOBIN A1C
Hgb A1c MFr Bld: 5.4 % (ref 4.8–5.6)
Mean Plasma Glucose: 108.28 mg/dL

## 2022-08-16 LAB — FERRITIN: Ferritin: 932 ng/mL — ABNORMAL HIGH (ref 24–336)

## 2022-08-16 LAB — MAGNESIUM: Magnesium: 2.6 mg/dL — ABNORMAL HIGH (ref 1.7–2.4)

## 2022-08-16 LAB — HEPARIN LEVEL (UNFRACTIONATED): Heparin Unfractionated: 0.26 IU/mL — ABNORMAL LOW (ref 0.30–0.70)

## 2022-08-16 MED ORDER — ALTEPLASE 2 MG IJ SOLR: 2.0000 mg | Freq: Once | INTRAMUSCULAR | Status: DC | PRN

## 2022-08-16 MED ORDER — METOPROLOL TARTRATE 12.5 MG HALF TABLET
12.5000 mg | ORAL_TABLET | Freq: Two times a day (BID) | ORAL | Status: DC
  Administered 2022-08-16 – 2022-08-17 (×3): 12.5 mg via ORAL
  Filled 2022-08-16 (×3): qty 1

## 2022-08-16 MED ORDER — ANTICOAGULANT SODIUM CITRATE 4% (200MG/5ML) IV SOLN: 5.0000 mL | Status: DC | PRN

## 2022-08-16 MED ORDER — HEPARIN SODIUM (PORCINE) 1000 UNIT/ML DIALYSIS
1000.0000 [IU] | INTRAMUSCULAR | Status: DC | PRN
  Administered 2022-08-16: 4200 [IU]
  Filled 2022-08-16 (×2): qty 1

## 2022-08-16 MED ORDER — HEPARIN BOLUS VIA INFUSION
4000.0000 [IU] | Freq: Once | INTRAVENOUS | Status: AC
  Administered 2022-08-16: 4000 [IU] via INTRAVENOUS
  Filled 2022-08-16: qty 4000

## 2022-08-16 MED ORDER — ATORVASTATIN CALCIUM 40 MG PO TABS
40.0000 mg | ORAL_TABLET | Freq: Every day | ORAL | Status: DC
  Administered 2022-08-16 – 2022-08-17 (×2): 40 mg via ORAL
  Filled 2022-08-16 (×2): qty 1

## 2022-08-16 MED ORDER — DICYCLOMINE HCL 10 MG PO CAPS
10.0000 mg | ORAL_CAPSULE | Freq: Once | ORAL | Status: AC
  Administered 2022-08-16: 10 mg via ORAL
  Filled 2022-08-16: qty 1

## 2022-08-16 MED ORDER — ASPIRIN 81 MG PO CHEW
324.0000 mg | CHEWABLE_TABLET | Freq: Once | ORAL | Status: AC
  Administered 2022-08-16: 324 mg via ORAL
  Filled 2022-08-16: qty 4

## 2022-08-16 MED ORDER — HEPARIN (PORCINE) 25000 UT/250ML-% IV SOLN
1350.0000 [IU]/h | INTRAVENOUS | Status: DC
  Administered 2022-08-16: 1200 [IU]/h via INTRAVENOUS
  Administered 2022-08-16: 1350 [IU]/h via INTRAVENOUS
  Filled 2022-08-16 (×3): qty 250

## 2022-08-16 MED ORDER — LOSARTAN POTASSIUM 50 MG PO TABS
50.0000 mg | ORAL_TABLET | Freq: Every day | ORAL | Status: DC
  Administered 2022-08-17: 50 mg via ORAL
  Filled 2022-08-16 (×2): qty 1

## 2022-08-16 MED ORDER — ASPIRIN 81 MG PO TBEC
81.0000 mg | DELAYED_RELEASE_TABLET | Freq: Every day | ORAL | Status: DC
  Administered 2022-08-17: 81 mg via ORAL
  Filled 2022-08-16: qty 1

## 2022-08-16 NOTE — ED Notes (Signed)
No complaints of chest pain or shortness of breath. Vitals WDL. SR on monitor. Heparin gtt infusing from previous shift. Hospitalist and cardiologist at bedside to evaluate.

## 2022-08-16 NOTE — ED Notes (Signed)
Patient transported to dialysis

## 2022-08-16 NOTE — Progress Notes (Signed)
ANTICOAGULATION CONSULT NOTE - Initial Consult  Pharmacy Consult for Heparin  Indication: chest pain/ACS  Allergies  Allergen Reactions   Chlorhexidine Itching    Burns skin   Other Other (See Comments)    Local anesthesia (quickly metabolizes)   Tape Other (See Comments)    Adhesive tape (irritates skin)    Vital Signs: Temp: 98.7 F (37.1 C) (01/31 0158) Temp Source: Oral (01/31 0158) BP: 132/106 (01/31 0530) Pulse Rate: 94 (01/31 0530)  Labs: Recent Labs    08/13/22 0700 08/16/22 0205 08/16/22 0301 08/16/22 0420  HGB  --  11.7*  --   --   HCT  --  37.3*  --   --   PLT  --  302  --   --   CREATININE 7.39*  --  10.83*  --   TROPONINIHS  --   --  786* 697*    CrCl cannot be calculated (Unknown ideal weight.).   Medical History: Past Medical History:  Diagnosis Date   Complication of anesthesia    "benadryl knocks me out; worse than any normal reactions; anesthesia/numbing RX wear off FAST" (01/30/2018) 2021 Took 3 weeks to able talk and eat soild food due to throat    ESRD (end stage renal disease) on dialysis ([place])    "HAD DIALYSIS BEFORE TRANSPLANT; RESTARTED DIALYSIS 01/30/2018)-  [facility]   GERD (gastroesophageal reflux disease)    History of blood transfusion    Hypertension    OSA on CPAP    Pneumonia     Assessment: 52 y/o M with chest pain and elevated high sensitivity troponin. Starting heparin for now. Hgb 11.7. ESRD.   Goal of Therapy:  Heparin level 0.3-0.7 units/ml Monitor platelets by anticoagulation protocol: Yes   Plan:  Heparin 4000 units BOLUS Start heparin drip at 1200 units/hr 1300 Heparin level Daily CBC/Heparin level Monitor for bleeding  Narda Bonds, PharmD, BCPS Clinical Pharmacist Phone: [phone]

## 2022-08-16 NOTE — Progress Notes (Signed)
BP high on HD, halfway done and still 160/120's - says didn't take home BP meds today (losartan 50mg ). Will order.   [name], PA-C [employer] Pager [phone]

## 2022-08-16 NOTE — Progress Notes (Addendum)
[name] Progress Note   Subjective:  Back to ED this AM after being discharged on 08/13/22. Presented with dull pain to L lower chest which lasted 30 minutes - improved on its own, then recurred in ED when transferring to bed. BP slightly up. Labs reviewed - Trop high/stable. EKG with TW changes. Cardiology consulted - plan for IV heparin and echo. He thinks that although his dry weight was lowered significantly on discharge a few days ago that his dry weight needs to go lower.  No current CP, dyspnea, abd pain, N/V/D, dysuria, edema.  Last HD was Monday 1/29 at his outpatient unit.  Objective Vitals:   08/16/22 0530 08/16/22 0555 08/16/22 0600 08/16/22 0830  BP: (!) 132/106  (!) 147/83 (!) 126/108  Pulse: 94  95 97  Resp: 15  15 16   Temp:  98.5 F (36.9 C)    TempSrc:  Oral    SpO2: 98%  93% 97%   Physical Exam General: Well appearing man, NAD. Nasal O2 in place Heart: RRR; no murmur Lungs: CTAB; no rales Abdomen: soft, non-tender Extremities: No LE edema Dialysis Access:  L [facility]  Additional Objective Labs: Basic Metabolic Panel: Recent Labs  Lab 08/12/22 1742 08/13/22 0700 08/16/22 0301  NA 136 135 136  K 5.7* 3.5 4.2  CL 100 93* 97*  CO2 18* 26 23  GLUCOSE 89 94 105*  BUN 56* 31* 63*  CREATININE 10.97* 7.39* 10.83*  CALCIUM 8.5* 8.7* 9.5  PHOS  --  4.8*  --    Liver Function Tests: Recent Labs  Lab 08/13/22 0700  ALBUMIN 4.0   CBC: Recent Labs  Lab 08/13/22 0507 08/16/22 0205  WBC 6.9 7.3  HGB 11.8* 11.7*  HCT 38.1* 37.3*  MCV 95.3 96.9  PLT 323 302   Studies/Results: DG Chest 2 View  Result Date: 08/16/2022 CLINICAL DATA:  Chest pain EXAM: CHEST - 2 VIEW COMPARISON:  08/12/2022 FINDINGS: Left IJ dual-lumen catheter tip at the superior cavoatrial junction. Stable cardiomegaly. Improved predominantly basilar airspace opacities since 08/12/2022. Slight decrease in right pleural effusion. IMPRESSION: Improving airspace opacities  compared to 08/12/2022. Small right pleural effusion. Cardiomegaly. Electronically Signed   By: Placido Sou M.D.   On: 08/16/2022 02:40    Medications:  sodium chloride     heparin 1,200 Units/hr (08/16/22 0509)    [START ON 08/17/2022] aspirin EC  81 mg Oral Daily   atorvastatin  40 mg Oral Daily   metoprolol tartrate  12.5 mg Oral BID   Dialysis Orders: MWF at [facility] 4:15hr, 400/500, EDW 114.5kg, 1K/2.5Ca, TDC, no heparin - Hectoral 5cmg Iv q HD - Getting course IV iron - no ESA  Assessment/Plan: 1. Chest pain: ?NSTEMI. Trop 786 -> 697, echo read pending. On IV heparin. Cardiology is following. 2. ESRD: Will dialyze today, per usual MWF schedule. He feels like still carrying extra fluid - will aim to challenge his dry weight down further to 112-113kg range. Typically uses 1K bath for chronic hyperkalemia - K 4.2 today, much lower than usual - use 3K bath today. 3. HTN/volume: BP slightly high, UF as tolerated with HD. 4. Anemia:  Hgb > 11, no ESA for now. 5. Secondary hyperparathyroidism: Ca/Phos ok - continue home meds.  [name], PA-C 08/16/2022, [facility]phrology attending. The patient was seen and examined in ER.  I agree with the assessment and plan as outlined above. Recently discharged from the hospital presented back with chest pain  consistent with NSTEMI.  Seen by cardiologist.  He is currently on heparin and getting echocardiogram.  Chest x-ray with cardiomegaly and pleural effusion.  We will try to do dialysis today with UF challenge.  May need to lower dry weight further on discharge.  Resume home medication.  Katheran James, [facility].

## 2022-08-16 NOTE — Hospital Course (Signed)
[name] is a 52 y.o. male with medical history significant of ESRD, obesity, HTN admitted for NSTEMI, currently getting medical management.   08/16/22: Cardiology recommended medical management. Nephrology consulted.

## 2022-08-16 NOTE — ED Triage Notes (Signed)
Pt to ED via EMS from home. Pt c/o chest pain that resolved prior to EMS arrival. Pt seen here 2 days ago for same and noted to have elevated troponin. Pt denies pain upon arrival to ED. Pt stated when pain was occurring it was radiating to left arm. Pt is dialysis Mon, wed, fri. Pt denies missing any treatments. Pt also c/o lower abd pain / gas pains.   EMS Vitals: 196/108 90 HR 98 CBG 99% RA

## 2022-08-16 NOTE — ED Notes (Signed)
Provided patient with bedside commode. Patient requested to be wheeled to the bathroom. No complaints of SOB. Patient did report some blood in his stool. MD is aware.

## 2022-08-16 NOTE — Consult Note (Signed)
Reason for Consult: Atypical chest pain Minimally elevated high-sensitivity troponin I in the setting of end-stage renal disease Referring Physician: [facility] hospitalist  [name] is an 52 y.o. male.  HPI: Patient is 52 year old male with past medical history significant for hypertension, end-stage renal disease on hemodialysis, GERD, obstructive sleep apnea on CPAP recently discharged from the hospital 3 days ago came to ER complaining of left-sided rib pain and vague abdominal pain since last night.  Patient denies any history of exertional chest pain EKG done in the ED showed normal sinus rhythm with  T wave changes in  lateral leads patient was noted to have minimally elevated troponin which is trending down patient had similar presentation 3 days ago with minimally elevated troponin high.  Presently patient denies any chest complaints.  Past Medical History:  Diagnosis Date   Complication of anesthesia    "benadryl knocks me out; worse than any normal reactions; anesthesia/numbing RX wear off FAST" (01/30/2018) 2021 Took 3 weeks to able talk and eat soild food due to throat    ESRD (end stage renal disease) on dialysis ([place])    "HAD DIALYSIS BEFORE TRANSPLANT; RESTARTED DIALYSIS 01/30/2018)-  [facility] - MWF   GERD (gastroesophageal reflux disease)    History of blood transfusion    Hypertension    OSA on CPAP    Pneumonia     Past Surgical History:  Procedure Laterality Date   A/V FISTULAGRAM N/A 02/19/2020   Procedure: A/V FISTULAGRAM;  Surgeon: Serafina Mitchell, MD;  Location: [place] [facility];  Service: Cardiovascular;  Laterality: N/A;   AV FISTULA PLACEMENT Right 2014   AV FISTULA PLACEMENT Right 12/26/2019   Procedure: Repair and Revision of Right Forearm AV Fistula;  Surgeon: Rosetta Posner, MD;  Location: [facility];  Service: Vascular;  Laterality: Right;   AV FISTULA PLACEMENT Left 07/29/2020   Procedure: LEFT ARM BRACHIOCEPHALIC ARTERIOVENOUS (AV) FISTULA CREATION;  Surgeon:  Waynetta Sandy, MD;  Location: [place] [facility];  Service: Vascular;  Laterality: Left;   [place] Left 11/09/2020   Procedure: LEFT BRACHIOCEPHALIC ARTERIOVENOUS FISTULA TRANSPOSITION;  Surgeon: Waynetta Sandy, MD;  Location: [place];  Service: Vascular;  Laterality: Left;   INSERTION OF DIALYSIS CATHETER Left 02/02/2020   Procedure: INSERTION OF LEFT INTERNAL JUGULAR 28 CM TUNNELED DIALYSIS CATHETER UNDER ULTRASOUND GUIDANCE;  Surgeon: Marty Heck, MD;  Location: [place];  Service: Vascular;  Laterality: Left;   KIDNEY TRANSPLANT  2014   at [place] Left 11/09/2020   Procedure: LIGATION OF COMPETING BRANCHES OF LEFT BRACHIOCEPHALIC ARTERIOVENOUS FISTULA;  Surgeon: Waynetta Sandy, MD;  Location: [place];  Service: Vascular;  Laterality: Left;   PARATHYROIDECTOMY  2014   at [place] Right 02/19/2020   Procedure: PERIPHERAL VASCULAR INTERVENTION;  Surgeon: Serafina Mitchell, MD;  Location: [place] [facility];  Service: Cardiovascular;  Laterality: Right;   REVISON OF ARTERIOVENOUS FISTULA Right 05/22/2020   Procedure: REPAIR OF RIGHT ARTERIOVENOUS FISTULA;  Surgeon: Waynetta Sandy, MD;  Location: [place];  Service: Vascular;  Laterality: Right;    Family History  Problem Relation Age of Onset   Heart failure Mother    Kidney failure Father     Social History:  reports that he has quit smoking. His smoking use included cigars. He has never used smokeless tobacco. He reports current alcohol use of about 2.0 standard drinks of alcohol per week. He  reports that he does not currently use drugs.  Allergies:  Allergies  Allergen Reactions   Chlorhexidine Itching    Burns skin   Other Other (See Comments)    Local anesthesia (quickly metabolizes)   Tape Other (See Comments)    Adhesive tape (irritates skin)    Medications: I have reviewed the patient's current  medications.  Results for orders placed or performed during the hospital encounter of 08/16/22 (from the past 48 hour(s))  CBC     Status: Abnormal   Collection Time: 08/16/22  2:05 AM  Result Value Ref Range   WBC 7.3 4.0 - 10.5 K/uL   RBC 3.85 (L) 4.22 - 5.81 MIL/uL   Hemoglobin 11.7 (L) 13.0 - 17.0 g/dL   HCT 37.3 (L) 39.0 - 52.0 %   MCV 96.9 80.0 - 100.0 fL   MCH 30.4 26.0 - 34.0 pg   MCHC 31.4 30.0 - 36.0 g/dL   RDW 14.7 11.5 - 15.5 %   Platelets 302 150 - 400 K/uL   nRBC 0.0 0.0 - 0.2 %    Comment: Performed at [facility], [place] [address], [place], [place] [address]  Magnesium     Status: Abnormal   Collection Time: 08/16/22  2:05 AM  Result Value Ref Range   Magnesium 2.6 (H) 1.7 - 2.4 mg/dL    Comment: Performed at [facility] [address], [place], [place] [address]  Basic metabolic panel     Status: Abnormal   Collection Time: 08/16/22  3:01 AM  Result Value Ref Range   Sodium 136 135 - 145 mmol/L   Potassium 4.2 3.5 - 5.1 mmol/L   Chloride 97 (L) 98 - 111 mmol/L   CO2 23 22 - 32 mmol/L   Glucose, Bld 105 (H) 70 - 99 mg/dL    Comment: Glucose reference range applies only to samples taken after fasting for at least 8 hours.   BUN 63 (H) 6 - 20 mg/dL   Creatinine, Ser 10.83 (H) 0.61 - 1.24 mg/dL   Calcium 9.5 8.9 - 10.3 mg/dL   GFR, Estimated 5 (L) >60 mL/min    Comment: (NOTE) Calculated using the CKD-EPI Creatinine Equation (2021)    Anion gap 16 (H) 5 - 15    Comment: Performed at [facility] [address], [place], [place] [address]  Troponin I (High Sensitivity)     Status: Abnormal   Collection Time: 08/16/22  3:01 AM  Result Value Ref Range   Troponin I (High Sensitivity) 786 (HH) <18 ng/L    Comment: CRITICAL RESULT CALLED TO, READ BACK BY AND VERIFIED WITH K.YUAL,RN. 0938 08/16/22. LPAIT (NOTE) Elevated high sensitivity troponin I (hsTnI) values and significant  changes across serial measurements may suggest ACS but many other   chronic and acute conditions are known to elevate hsTnI results.  Refer to the "Links" section for chest pain algorithms and additional  guidance. Performed at [facility], [place] [address] [place], [place] [address]   Troponin I (High Sensitivity)     Status: Abnormal   Collection Time: 08/16/22  4:20 AM  Result Value Ref Range   Troponin I (High Sensitivity) 697 (HH) <18 ng/L    Comment: CRITICAL VALUE NOTED. VALUE IS CONSISTENT WITH PREVIOUSLY REPORTED/CALLED VALUE (NOTE) Elevated high sensitivity troponin I (hsTnI) values and significant  changes across serial measurements may suggest ACS but many other  chronic and acute conditions are known to elevate hsTnI results.  Refer to the "Links" section for chest pain algorithms and additional  guidance. Performed at [facility], [place] [address] [place], [place] [address]     DG Chest 2 View  Result Date: 08/16/2022 CLINICAL DATA:  Chest pain EXAM: CHEST - 2 VIEW COMPARISON:  08/12/2022 FINDINGS: Left IJ dual-lumen catheter tip at the superior cavoatrial junction. Stable cardiomegaly. Improved predominantly basilar airspace opacities since 08/12/2022. Slight decrease in right pleural effusion. IMPRESSION: Improving airspace opacities compared to 08/12/2022. Small right pleural effusion. Cardiomegaly. Electronically Signed   By: Placido Sou M.D.   On: 08/16/2022 02:40    Review of Systems  Constitutional:  Negative for diaphoresis and fever.  HENT:  Negative for congestion.   Eyes:  Negative for discharge.  Respiratory:  Negative for shortness of breath.   Cardiovascular:  Positive for chest pain. Negative for palpitations and leg swelling.  Gastrointestinal:  Positive for abdominal distention and abdominal pain.  Genitourinary:  Negative for difficulty urinating.  Neurological:  Negative for dizziness.   Blood pressure (!) 147/83, pulse 95, temperature 98.5 F (36.9 C), temperature source Oral, resp. rate 15,  SpO2 93 %. Physical Exam Constitutional:      Appearance: He is well-developed.  HENT:     Head: Normocephalic and atraumatic.  Eyes:     Pupils: Pupils are equal, round, and reactive to light.  Neck:     Vascular: No JVD.  Cardiovascular:     Rate and Rhythm: Normal rate and regular rhythm.     Heart sounds: No murmur (2/6 systolic murmur noted no pericardial rub) heard. Pulmonary:     Effort: Pulmonary effort is normal.     Breath sounds: Normal breath sounds.  Musculoskeletal:     Cervical back: Normal range of motion and neck supple.     Comments: No clubbing cyanosis or edema  Neurological:     General: No focal deficit present.     Mental Status: He is alert and oriented to person, place, and time.     Assessment/Plan: Atypical chest pain with minimally elevated troponin cannot rule out small NSTEMI Hypertension End-stage renal disease on hemodialysis GERD Obstructive sleep apnea Anemia of chronic disease Plan Check serial enzymes and EKG Check 2D echo Agree with aspirin heparin and statin Add low-dose beta-blockers Discussed with patient regarding various options of treatment states wants medical treatment only. Charolette Forward 08/16/2022, 7:47 AM

## 2022-08-16 NOTE — ED Provider Notes (Signed)
[facility] Provider Note   CSN: [id number] Arrival date & time: 08/16/22  0147     History  Chief Complaint  Patient presents with   Chest Pain    [name] is a 52 y.o. male.  Patient with of hypertension, ESRD on hemodialysis presents today with complaints of chest pain. He states that same began around midnight tonight when he was getting ready to go to sleep. He states that it lasted a little over an hour and then spontaneously resolved after EMS arrived. He has been asymptomatic since. Pain was in his left ribcage and radiated to his left shoulder. States that pain is similar to when he was admitted on 1/27 for fluid overload but not as severe this time. He states he was short of breath when he was having pain but is no longer. Denies fevers, chills, nausea, vomiting. He does not feel fluid overloaded today. States he has not missed any dialysis, last session was Monday and he is scheduled to go later today.   The history is provided by the patient. No language interpreter was used.  Chest Pain      Home Medications Prior to Admission medications   Medication Sig Start Date End Date Taking? Authorizing Provider  acetaminophen (TYLENOL) 500 MG tablet Take 1,000 mg by mouth every 6 (six) hours as needed for mild pain.    [provider]  calcium acetate (PHOSLO) 667 MG capsule Take 1,334-2,668 mg by mouth with breakfast, with lunch, and with evening meal. Take 4 capsules (2,668 mg) by mouth with each meal & take 2 capsules (1,334 mg) by mouth with snacks 12/01/19   [provider]  losartan (COZAAR) 25 MG tablet Take 25 mg by mouth at bedtime. 08/06/19   [provider]  multivitamin (RENA-VIT) TABS tablet Take 1 tablet by mouth every evening.    [provider]      Allergies    Chlorhexidine, Other, and Tape    Review of Systems   Review of Systems  Cardiovascular:  Positive for chest  pain (resolved).  All other systems reviewed and are negative.   Physical Exam Updated Vital Signs BP (!) 161/116 (BP Location: Right Arm)   Pulse 89   Temp 98.7 F (37.1 C) (Oral)   Resp 16   SpO2 95%  Physical Exam Vitals and nursing note reviewed.  Constitutional:      General: He is not in acute distress.    Appearance: Normal appearance. He is normal weight. He is not ill-appearing, toxic-appearing or diaphoretic.  HENT:     Head: Normocephalic and atraumatic.  Cardiovascular:     Rate and Rhythm: Normal rate and regular rhythm.     Pulses:          Dorsalis pedis pulses are 2+ on the right side and 2+ on the left side.     Heart sounds: Normal heart sounds.  Pulmonary:     Effort: Pulmonary effort is normal. No respiratory distress.     Breath sounds: Normal breath sounds.  Abdominal:     Palpations: Abdomen is soft.  Musculoskeletal:        General: Normal range of motion.     Cervical back: Normal range of motion.     Right lower leg: No tenderness. No edema.     Left lower leg: No tenderness. No edema.  Skin:    General: Skin is warm and dry.  Neurological:  General: No focal deficit present.     Mental Status: He is alert.  Psychiatric:        Mood and Affect: Mood normal.        Behavior: Behavior normal.     ED Results / Procedures / Treatments   Labs (all labs ordered are listed, but only abnormal results are displayed) Labs Reviewed  CBC - Abnormal; Notable for the following components:      Result Value   RBC 3.85 (*)    Hemoglobin 11.7 (*)    HCT 37.3 (*)    All other components within normal limits  MAGNESIUM - Abnormal; Notable for the following components:   Magnesium 2.6 (*)    All other components within normal limits  BASIC METABOLIC PANEL - Abnormal; Notable for the following components:   Chloride 97 (*)    Glucose, Bld 105 (*)    BUN 63 (*)    Creatinine, Ser 10.83 (*)    GFR, Estimated 5 (*)    Anion gap 16 (*)    All other  components within normal limits  TROPONIN I (HIGH SENSITIVITY) - Abnormal; Notable for the following components:   Troponin I (High Sensitivity) 786 (*)    All other components within normal limits  TROPONIN I (HIGH SENSITIVITY)    EKG EKG Interpretation  Date/Time:  Wednesday August 16 2022 01:55:47 EST Ventricular Rate:  90 PR Interval:  205 QRS Duration: 87 QT Interval:  387 QTC Calculation: 474 R Axis:   -18 Text Interpretation: Sinus rhythm Borderline prolonged PR interval Probable left atrial enlargement Borderline left axis deviation Abnormal T, consider ischemia, lateral leads Confirmed by Gerlene Fee [id number]) on 08/16/2022 4:58:46 AM  Radiology DG Chest 2 View  Result Date: 08/16/2022 CLINICAL DATA:  Chest pain EXAM: CHEST - 2 VIEW COMPARISON:  08/12/2022 FINDINGS: Left IJ dual-lumen catheter tip at the superior cavoatrial junction. Stable cardiomegaly. Improved predominantly basilar airspace opacities since 08/12/2022. Slight decrease in right pleural effusion. IMPRESSION: Improving airspace opacities compared to 08/12/2022. Small right pleural effusion. Cardiomegaly. Electronically Signed   By: Placido Sou M.D.   On: 08/16/2022 02:40    Procedures .Critical Care  Performed by: Bud Face, PA-C Authorized by: Bud Face, PA-C   Critical care provider statement:    Critical care time (minutes):  35   Critical care start time:  08/16/2022 3:00 AM   Critical care end time:  08/16/2022 3:35 AM   Critical care was necessary to treat or prevent imminent or life-threatening deterioration of the following conditions:  Cardiac failure   Critical care was time spent personally by me on the following activities:  Development of treatment plan with patient or surrogate, discussions with consultants, discussions with primary provider, evaluation of patient's response to treatment, examination of patient, obtaining history from patient or surrogate, ordering and review of  laboratory studies, ordering and review of radiographic studies, pulse oximetry, re-evaluation of patient's condition and review of old charts     Medications Ordered in ED Medications - No data to display  ED Course/ Medical Decision Making/ A&P                             Medical Decision Making Amount and/or Complexity of Data Reviewed Labs: ordered. Radiology: ordered.  Risk OTC drugs. Prescription drug management. Decision regarding hospitalization.   This patient is a 52 y.o. male who presents to the ED for concern of  chest pain, this involves an extensive number of treatment options, and is a complaint that carries with it a high risk of complications and morbidity. The emergent differential diagnosis prior to evaluation includes, but is not limited to,  ACS, pericarditis, myocarditis, aortic dissection, PE, pneumothorax, esophageal spasm or rupture, chronic angina, pneumonia, bronchitis, GERD, reflux/PUD, biliary disease, pancreatitis, costochondritis, anxiety   This is not an exhaustive differential.   Past Medical History / Co-morbidities / Social History: Hx hypertension, ESRD on hemodialysis  Additional history: Chart reviewed. Pertinent results include: patient recently admitted on 1/27 for chest pain and shortness of breath requiring bipap and nitro drip. Patient found to be fluid overloaded despite compliance with hemodialysis. Patient was emergently dialyzed and symptoms completely resolved and he was subsequently discharged home  Physical Exam: Physical exam performed. The pertinent findings include: no signs of fluid overload. No pertinent physical exam findings  Lab Tests: I ordered, and personally interpreted labs.  The pertinent results include: Troponin 786.  Hgb 11.7, consistent with baseline   Imaging Studies: I ordered imaging studies including CXR. I independently visualized and interpreted imaging which showed   Improving airspace opacities  compared to 08/12/2022.   Small right pleural effusion.   Cardiomegaly.  I agree with the radiologist interpretation.   Cardiac Monitoring:  The patient was maintained on a cardiac monitor.  My attending physician Dr. Sedonia Small viewed and interpreted the cardiac monitored which showed an underlying rhythm of: T wave inversions in lateral leads, no STEMI. I agree with this interpretation.   Medications: I ordered medication including ASA and heparin  for NSTEMI. Reevaluation of the patient after these medicines showed that the patient stayed the same. I have reviewed the patients home medicines and have made adjustments as needed.  Consultations Obtained: I requested consultation with the cardiology on call Dr. Terrence Dupont,  and discussed lab and imaging findings as well as pertinent plan - they recommend: heparin and trend troponin, admit to hospitalist with cardiology consult   Disposition:  Patient presents today with complaints of chest pain. Pain upon initial presentation had resolved, however patient states when he got up to go to the bathroom around 0330 this am, his pain returned. Pain resolved again upon resting in bed. Troponin found to be 786, EKG shows new T wave inversions. Discussed with cardiology who will see the patient. Plan for heparin and hospitalist admission. Patient is understanding and in agreement with plan.  Discussed patient with hospitalist who agrees to admit.  This is a shared visit with supervising physician Dr. Sedonia Small who has independently evaluated patient & provided guidance in evaluation/management/disposition, in agreement with care    Final Clinical Impression(s) / ED Diagnoses Final diagnoses:  NSTEMI (non-ST elevated myocardial infarction) [facility])    Rx / [facility] Orders ED Discharge Orders     None         Nestor Lewandowsky 08/16/22 1975    Maudie Flakes, MD 08/16/22 [phone]

## 2022-08-16 NOTE — H&P (Signed)
History and Physical    Patient: Jerry [name] DJT:[id number] DOB: 1970-12-29 DOA: 08/16/2022 DOS: the patient was seen and examined on 08/16/2022 PCP: Patient, No Pcp Per  Patient coming from: Home  Chief Complaint:  Chief Complaint  Patient presents with   Chest Pain   HPI: Jerry [name] is a 52 y.o. male with medical history significant of ESRD, obesity, HTN here with chest pain.   Pt reports he was at home when he developed chest pain at rest.  He describes chest pressure on the left side of his chest felt like "being shot while wearing bulletproof vest" this lasted 30 minutes.  He reports another episode while walking to the bathroom here with pressure on the left side of chest.  He reports no previous history of chest pain.  He reports dialysis has not been as much fluid. He reports episode of stool streaked with blood while in the ED. Denies significant amount of blood or blood with wiping. No hx of prior in the past.   No other notable symptoms including lower extreme edema, palpitations, syncope, shortness of breath, orthopnea, PND.  Prior smoker  Review of Systems: As mentioned in the history of present illness. All other systems reviewed and are negative. Past Medical History:  Diagnosis Date   Complication of anesthesia    "benadryl knocks me out; worse than any normal reactions; anesthesia/numbing RX wear off FAST" (01/30/2018) 2021 Took 3 weeks to able talk and eat soild food due to throat    ESRD (end stage renal disease) on dialysis ([place])    "HAD DIALYSIS BEFORE TRANSPLANT; RESTARTED DIALYSIS 01/30/2018)-  [facility]   GERD (gastroesophageal reflux disease)    History of blood transfusion    Hypertension    OSA on CPAP    Pneumonia    Past Surgical History:  Procedure Laterality Date   A/V FISTULAGRAM N/A 02/19/2020   Procedure: A/V FISTULAGRAM;  Surgeon: Serafina Mitchell, MD;  Location: [facility];  Service: Cardiovascular;  Laterality: N/A;   AV  FISTULA PLACEMENT Right 2014   AV FISTULA PLACEMENT Right 12/26/2019   Procedure: Repair and Revision of Right Forearm AV Fistula;  Surgeon: Rosetta Posner, MD;  Location: [place];  Service: Vascular;  Laterality: Right;   AV FISTULA PLACEMENT Left 07/29/2020   Procedure: LEFT ARM BRACHIOCEPHALIC ARTERIOVENOUS (AV) FISTULA CREATION;  Surgeon: Waynetta Sandy, MD;  Location: [place];  Service: Vascular;  Laterality: Left;   [place] Left 11/09/2020   Procedure: LEFT BRACHIOCEPHALIC ARTERIOVENOUS FISTULA TRANSPOSITION;  Surgeon: Waynetta Sandy, MD;  Location: [place];  Service: Vascular;  Laterality: Left;   INSERTION OF DIALYSIS CATHETER Left 02/02/2020   Procedure: INSERTION OF LEFT INTERNAL JUGULAR 28 CM TUNNELED DIALYSIS CATHETER UNDER ULTRASOUND GUIDANCE;  Surgeon: Marty Heck, MD;  Location: [place];  Service: Vascular;  Laterality: Left;   KIDNEY TRANSPLANT  2014   at [place] Left 11/09/2020   Procedure: LIGATION OF COMPETING BRANCHES OF LEFT BRACHIOCEPHALIC ARTERIOVENOUS FISTULA;  Surgeon: Waynetta Sandy, MD;  Location: [place];  Service: Vascular;  Laterality: Left;   PARATHYROIDECTOMY  2014   at [place] Right 02/19/2020   Procedure: PERIPHERAL VASCULAR INTERVENTION;  Surgeon: Serafina Mitchell, MD;  Location: [facility];  Service: Cardiovascular;  Laterality: Right;   REVISON OF ARTERIOVENOUS FISTULA Right 05/22/2020   Procedure: REPAIR OF RIGHT ARTERIOVENOUS FISTULA;  Surgeon: Waynetta Sandy, MD;  Location: [facility];  Service: Vascular;  Laterality: Right;   Social History:  reports that he has quit smoking. His smoking use included cigars. He has never used smokeless tobacco. He reports current alcohol use of about 2.0 standard drinks of alcohol per week. He reports that he does not currently use drugs.  Allergies  Allergen Reactions    Chlorhexidine Itching    Burns skin   Other Other (See Comments)    Local anesthesia (quickly metabolizes)   Tape Other (See Comments)    Adhesive tape (irritates skin)    Family History  Problem Relation Age of Onset   Heart failure Mother    Kidney failure Father     Prior to Admission medications   Medication Sig Start Date End Date Taking? Authorizing Provider  acetaminophen (TYLENOL) 500 MG tablet Take 1,000 mg by mouth every 6 (six) hours as needed for mild pain.    [provider]  calcium acetate (PHOSLO) 667 MG capsule Take 1,334-2,668 mg by mouth with breakfast, with lunch, and with evening meal. Take 4 capsules (2,668 mg) by mouth with each meal & take 2 capsules (1,334 mg) by mouth with snacks 12/01/19   [provider]  losartan (COZAAR) 25 MG tablet Take 25 mg by mouth at bedtime. 08/06/19   [provider]  multivitamin (RENA-VIT) TABS tablet Take 1 tablet by mouth every evening.    [provider]    Physical Exam: Vitals:   08/16/22 0500 08/16/22 0530 08/16/22 0555 08/16/22 0600  BP: (!) 164/116 (!) 132/106  (!) 147/83  Pulse: 85 94  95  Resp: 13 15  15   Temp:   98.5 F (36.9 C)   TempSrc:   Oral   SpO2: 100% 98%  93%   Physical Exam Vitals and nursing note reviewed.  Constitutional:      Appearance: He is obese. He is not diaphoretic.  HENT:     Head: Atraumatic.  Eyes:     Pupils: Pupils are equal, round, and reactive to light.  Cardiovascular:     Rate and Rhythm: Normal rate and regular rhythm.     Pulses: Normal pulses.     Heart sounds: No murmur heard. Pulmonary:     Effort: Pulmonary effort is normal. No respiratory distress.     Breath sounds: Normal breath sounds. No rales.  Abdominal:     General: Abdomen is flat. There is no distension.     Palpations: Abdomen is soft.     Tenderness: There is no abdominal tenderness. There is no rebound.  Musculoskeletal:     Right lower leg: No edema.     Left lower  leg: No edema.  Skin:    General: Skin is warm and dry.     Capillary Refill: Capillary refill takes less than 2 seconds.  Neurological:     Mental Status: He is alert and oriented to person, place, and time. Mental status is at baseline.  Psychiatric:        Mood and Affect: Mood normal.     Data Reviewed:      Latest Ref Rng & Units 08/16/2022    2:05 AM 08/13/2022    5:07 AM 11/09/2020    6:12 AM  CBC  WBC 4.0 - 10.5 K/uL 7.3  6.9    Hemoglobin 13.0 - 17.0 g/dL 11.7  11.8  11.9   Hematocrit 39.0 - 52.0 % 37.3  38.1  35.0  Platelets 150 - 400 K/uL 302  323         Latest Ref Rng & Units 08/16/2022    3:01 AM 08/13/2022    7:00 AM 08/12/2022    5:42 PM  BMP  Glucose 70 - 99 mg/dL 105  94  89   BUN 6 - 20 mg/dL 63  31  56   Creatinine 0.61 - 1.24 mg/dL 10.83  7.39  10.97   Sodium 135 - 145 mmol/L 136  135  136   Potassium 3.5 - 5.1 mmol/L 4.2  3.5  5.7   Chloride 98 - 111 mmol/L 97  93  100   CO2 22 - 32 mmol/L 23  26  18    Calcium 8.9 - 10.3 mg/dL 9.5  8.7  8.5    Initial 786, repeat 697.   Assessment and Plan: Juaquin Ludington is a 52 y.o. male with medical history significant of ESRD, obesity, HTN admitted for NSTEMI vs. Type II MI  NSTEMI vs. Type II MI Chest pain that began at rest at home while laying in bed, left sided chest pressure that lasted 30 minutes. Another episode of exertional chest pain while in ED. Elevated troponin and EKG changes with t-wave inversions lateral leads. NSTEMI vs. Type II 2/2 volume overload or HTN episode. Cardiology d/w patient options, and patient elected medical management at this time. Risk factors include obesity, ESRD, hx of HTN.  -s/p aspirin 324 mg followed by asa daily -on heparin ACS -holding P2Y12 inhibitor  -metoprolol tartrate 12.5 mg BID -atorvastatin 40 mg  -lipid panel, A1c pending -echocardiogram -telemetry  -Cardiology consulted  BRBPR Normocytic anemia He reports episode of bright red blood within stool  while in the ED. Denies significant amount of blood in stool or blood with wiping. No hx of prior in the past.  -FOBT, iron panel -repeat CBC q12h while on heparin -outpatient colonoscopy  ESRD MWF HD Hypermagnesemia  He reports less fluid being taken off due to dry weight calculation error. He seems euvolemic on exam.  -Nephrology consult  HTN -continue losartan 25 mg after HD today   DVT PPX:: on heparin   Advance Care Planning:   Code Status: Prior Full  Consults: Cardiology, Nephrology  Family Communication: None at bedside  Severity of Illness: The appropriate patient status for this patient is INPATIENT. Inpatient status is judged to be reasonable and necessary in order to provide the required intensity of service to ensure the patient's safety. The patient's presenting symptoms, physical exam findings, and initial radiographic and laboratory data in the context of their chronic comorbidities is felt to place them at high risk for further clinical deterioration. Furthermore, it is not anticipated that the patient will be medically stable for discharge from the hospital within 2 midnights of admission.   * I certify that at the point of admission it is my clinical judgment that the patient will require inpatient hospital care spanning beyond 2 midnights from the point of admission due to high intensity of service, high risk for further deterioration and high frequency of surveillance required.*  Author: Lorelei Pont, MD 08/16/2022 8:19 AM  For on call review [email or web address].

## 2022-08-16 NOTE — Progress Notes (Signed)
  Echocardiogram 2D Echocardiogram has been performed.  Jerry [name] 08/16/2022, 11:02 AM

## 2022-08-16 NOTE — ED Notes (Signed)
Patient reports started having watery diarrhea, has had 3 bowl moment here in the ED.

## 2022-08-16 NOTE — Progress Notes (Signed)
Post Hemodialysis Tx   08/16/22 1842  Vitals  Temp 97.9 F (36.6 C)  Resp 15  BP (!) 154/96  O2 Device Room Air  Weight  (unable to obtain pt on stretcher)  Type of Weight Post-Dialysis  Oxygen Therapy  Pulse Oximetry Type Continuous  Post Treatment  Dialyzer Clearance Lightly streaked  Duration of HD Treatment -hour(s) 4 hour(s)  Liters Processed 96  Fluid Removed (mL) 2.9 mL  Tolerated HD Treatment Yes  Post-Hemodialysis Comments Pt tolerated tx well, had transient B/P increase at end of tx, resolved with blood return.

## 2022-08-16 NOTE — Progress Notes (Addendum)
ANTICOAGULATION CONSULT NOTE - Initial Consult  Pharmacy Consult for Heparin  Indication: chest pain/ACS  Allergies  Allergen Reactions   Chlorhexidine Itching    Burns skin   Other Other (See Comments)    Local anesthesia (quickly metabolizes)   Tape Other (See Comments)    Adhesive tape (irritates skin)    Vital Signs: Temp: 98.7 F (37.1 C) (01/31 1338) Temp Source: Oral (01/31 1338) BP: 154/107 (01/31 1442) Pulse Rate: 73 (01/31 1449)  Labs: Recent Labs    08/16/22 0205 08/16/22 0301 08/16/22 0420 08/16/22 1400  HGB 11.7*  --   --   --   HCT 37.3*  --   --   --   PLT 302  --   --   --   HEPARINUNFRC  --   --   --  0.26*  CREATININE  --  10.83*  --   --   TROPONINIHS  --  786* 697*  --      CrCl cannot be calculated (Unknown ideal weight.).   Medical History: Past Medical History:  Diagnosis Date   Complication of anesthesia    "benadryl knocks me out; worse than any normal reactions; anesthesia/numbing RX wear off FAST" (01/30/2018) 2021 Took 3 weeks to able talk and eat soild food due to throat    ESRD (end stage renal disease) on dialysis ([place])    "HAD DIALYSIS BEFORE TRANSPLANT; RESTARTED DIALYSIS 01/30/2018)-  [facility]   GERD (gastroesophageal reflux disease)    History of blood transfusion    Hypertension    OSA on CPAP    Pneumonia     Assessment: 52 y/o M with chest pain and elevated high sensitivity troponin. Starting heparin for medical management per cardiology. ESRD.   Heparin level slightly low at 0.26. Patient reported one episode of bright red blood in stool while in ED but was not a significant amount. MD monitoring for now and checking fecal occult. No other bleeding noted.   Goal of Therapy:  Heparin level 0.3-0.7 units/ml Monitor platelets by anticoagulation protocol: Yes   Plan:  Increase heparin to 1350 units/hr Check heparin level in 8 hours  Daily CBC/Heparin level Monitor for bleeding  Dimple Nanas, PharmD,  BCPS 08/16/2022 3:14 PM

## 2022-08-16 NOTE — ED Notes (Signed)
Critical lab value, troponin-786, reported to S. Smoot PA

## 2022-08-17 DIAGNOSIS — I214 Non-ST elevation (NSTEMI) myocardial infarction: Secondary | ICD-10-CM | POA: Diagnosis not present

## 2022-08-17 LAB — CBC
HCT: 39.5 % (ref 39.0–52.0)
Hemoglobin: 12.8 g/dL — ABNORMAL LOW (ref 13.0–17.0)
MCH: 30.6 pg (ref 26.0–34.0)
MCHC: 32.4 g/dL (ref 30.0–36.0)
MCV: 94.5 fL (ref 80.0–100.0)
Platelets: 326 10*3/uL (ref 150–400)
RBC: 4.18 MIL/uL — ABNORMAL LOW (ref 4.22–5.81)
RDW: 14.6 % (ref 11.5–15.5)
WBC: 7.3 10*3/uL (ref 4.0–10.5)
nRBC: 0 % (ref 0.0–0.2)

## 2022-08-17 LAB — BASIC METABOLIC PANEL
Anion gap: 13 (ref 5–15)
BUN: 38 mg/dL — ABNORMAL HIGH (ref 6–20)
CO2: 26 mmol/L (ref 22–32)
Calcium: 9 mg/dL (ref 8.9–10.3)
Chloride: 96 mmol/L — ABNORMAL LOW (ref 98–111)
Creatinine, Ser: 8.32 mg/dL — ABNORMAL HIGH (ref 0.61–1.24)
GFR, Estimated: 7 mL/min — ABNORMAL LOW (ref >60)
Glucose, Bld: 98 mg/dL (ref 70–99)
Potassium: 4.3 mmol/L (ref 3.5–5.1)
Sodium: 135 mmol/L (ref 135–145)

## 2022-08-17 LAB — HEPARIN LEVEL (UNFRACTIONATED): Heparin Unfractionated: 0.32 IU/mL (ref 0.30–0.70)

## 2022-08-17 MED ORDER — METOPROLOL SUCCINATE ER 25 MG PO TB24: 12.5000 mg | ORAL_TABLET | Freq: Every day | ORAL | 0 refills | Status: DC

## 2022-08-17 MED ORDER — ATORVASTATIN CALCIUM 40 MG PO TABS: 40.0000 mg | ORAL_TABLET | Freq: Every day | ORAL | 0 refills | Status: DC

## 2022-08-17 MED ORDER — ASPIRIN 81 MG PO TBEC: 81.0000 mg | DELAYED_RELEASE_TABLET | Freq: Every day | ORAL | 0 refills | Status: AC

## 2022-08-17 MED ORDER — ASPIRIN 81 MG PO TBEC: 81.0000 mg | DELAYED_RELEASE_TABLET | Freq: Every day | ORAL | 0 refills | Status: DC

## 2022-08-17 NOTE — Progress Notes (Signed)
ANTICOAGULATION CONSULT NOTE - Initial Consult  Pharmacy Consult for Heparin  Indication: chest pain/ACS  Allergies  Allergen Reactions   Chlorhexidine Itching    Burns skin   Other Other (See Comments)    Local anesthesia (quickly metabolizes)   Tape Other (See Comments)    Adhesive tape (irritates skin)    Vital Signs: Temp: 98 F (36.7 C) (02/01 0440) Temp Source: Oral (02/01 0440) BP: 137/96 (02/01 0440) Pulse Rate: 93 (02/01 0440)  Labs: Recent Labs    08/16/22 0205 08/16/22 0301 08/16/22 0420 08/16/22 1400 08/16/22 2043 08/17/22 0458  HGB 11.7*  --   --   --  13.0 12.8*  HCT 37.3*  --   --   --  41.1 39.5  PLT 302  --   --   --  344 326  HEPARINUNFRC  --   --   --  0.26*  --  0.32  CREATININE  --  10.83*  --   --   --   --   TROPONINIHS  --  786* 697* 471*  --   --      Estimated Creatinine Clearance: 9.8 mL/min (A) (by C-G formula based on SCr of 10.83 mg/dL (H)).   Medical History: Past Medical History:  Diagnosis Date   Complication of anesthesia    "benadryl knocks me out; worse than any normal reactions; anesthesia/numbing RX wear off FAST" (01/30/2018) 2021 Took 3 weeks to able talk and eat soild food due to throat    ESRD (end stage renal disease) on dialysis ([place])    "HAD DIALYSIS BEFORE TRANSPLANT; RESTARTED DIALYSIS 01/30/2018)-  [facility]   GERD (gastroesophageal reflux disease)    History of blood transfusion    Hypertension    OSA on CPAP    Pneumonia     Assessment: 52 y/o M with chest pain and elevated high sensitivity troponin. Starting heparin for medical management per cardiology. ESRD.   Heparin level slightly low at 0.26. Patient reported one episode of bright red blood in stool while in ED but was not a significant amount. MD monitoring for now and checking fecal occult. No other bleeding noted.   Heparin level came back therapeutic this AM. We will cont with current rate and check confirm level in 6 hrs.  Goal of Therapy:   Heparin level 0.3-0.7 units/ml Monitor platelets by anticoagulation protocol: Yes   Plan:  Cont heparin 1350 units/hr Check heparin level in 6 hours  Daily CBC/Heparin level Monitor for bleeding  Onnie Boer, PharmD, BCIDP, AAHIVP, CPP Infectious Disease Pharmacist 08/17/2022 5:49 AM

## 2022-08-17 NOTE — Discharge Summary (Signed)
Physician Discharge Summary   Patient: Jerry [name] MRN: [id number] DOB: 10/06/1970  Admit date:     08/16/2022  Discharge date: 08/17/22  Discharge Physician: Patrecia Pour   PCP: Corliss Parish, MD   Recommendations at discharge:  Follow up with nephrology, routine HD MWF with aim to lower EDW. Next due 2/2.  Follow up with cardiology, Dr. Terrence Dupont, soon to pursue cardiac catheterization for concern of NSTEMI and new regional wall motion abnormality associated with LVEF 45-50% by echocardiogram. Pt opted to defer this at this time.  New discharge medications:  Aspirin 81mg  Atorvastatin 40mg  Metoprolol 12.5mg  daily  Discharge Diagnoses: Principal Problem:   NSTEMI (non-ST elevated myocardial infarction) [facility])  Hospital Course: Jerry [name] is a 52 y.o. male with a history of ESRD on HD MWF, obesity, HTN who was admitted for NSTEMI. Heparin infusion has been continued and the following day the patient feels very well. Echocardiogram revealed wall motion abnormality (new from prior) and depressed LVEF from prior to 45-50%. He has discussed work up and treatment options with cardiology, Dr. Terrence Dupont, and opts to be discharged 2/1 and pursue further work up as an outpatient. GDMT initiated, as well as aspirin and statin. Had hemodialysis 1/31 with lower dry weight.   Assessment and Plan: NSTEMI: Atypical chest pain though with troponin elevation, lateral TWI, new (from 2019) wall motion abnormality. Cardiac enzymes trending down, chest pain resolved with hemodialysis.  - Continue ASA, statin, BB.  - Discussed with Dr. Terrence Dupont who reports the patient prefers to discharge without further work up at this time. Will follow up in 1-2 weeks.   Acute HFrEF: Volume overload slightly on exam at admission and improved symptoms with dialysis, 3L off. LVEF 45-50% on echo.  - Continue ARB, starting metoprolol, change to succinate at DC. Manage volume with dialysis.  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 No brisk/ongoing bleeding noted during hospitalization while on heparin. He reports episode of bright red blood within stool while in the ED.  - Bleeding precautions reviewed. Recommend outpatient colonoscopy   ESRD MWF HD Hypermagnesemia  - Down to 112.1kg after 3L UF with HF 1/31. D/w nephrology, Dr. Carolin Sicks, who will communicate with outpatient HD center suspicion that dry weight may be close to 110kg. Next HD 2/2.   HTN - Continue home losartan, new metoprolol  Consultants: Cardiology Procedures performed: Echocardiogram  Disposition: Home Diet recommendation: Cardiac/renal  DISCHARGE MEDICATION: Allergies as of 08/17/2022       Reactions   Chlorhexidine Itching   Burns skin   Other Other (See Comments)   Local anesthesia (quickly metabolizes)   Tape Other (See Comments)   Adhesive tape (irritates skin)        Medication List     TAKE these medications    acetaminophen 500 MG tablet Commonly known as: TYLENOL Take 1,000 mg by mouth every 6 (six) hours as needed for mild pain.   aspirin EC 81 MG tablet Take 1 tablet (81 mg total) by mouth daily. Swallow whole. Start taking on: August 18, 2022   atorvastatin 40 MG tablet Commonly known as: LIPITOR Take 1 tablet (40 mg total) by mouth daily. Start taking on: August 18, 2022   calcium acetate 667 MG capsule Commonly known as: PHOSLO Take [phone] mg by mouth with breakfast, with lunch, and with evening meal. Take 4 capsules (2,668 mg) by mouth with each meal & take 2 capsules (1,334 mg) by mouth with snacks  losartan 25 MG tablet Commonly known as: COZAAR Take 25 mg by mouth at bedtime.   metoprolol succinate 25 MG 24 hr tablet Commonly known as: Toprol XL Take 0.5 tablets (12.5 mg total) by mouth daily.   multivitamin Tabs tablet Take 1 tablet by mouth every evening.        Follow-up Information     Corliss Parish, MD Follow  up.   Specialty: Nephrology Contact information: [place] [address] [phone]         Charolette Forward, MD Follow up.   Specialty: Cardiology Contact information: [place] [address] [phone]                Discharge Exam: Danley Danker Weights   08/16/22 1300 08/16/22 1338 08/16/22 2059  Weight: 115.5 kg 115.5 kg 112.1 kg  BP (!) 122/90 (BP Location: Right Arm)   Pulse 99   Temp 98.4 F (36.9 C) (Oral)   Resp 16   Ht 5\' 8"  (1.727 m)   Wt 112.1 kg   SpO2 100%   BMI 37.58 kg/m   Well-appearing male in no distress. Spoke with SO by speaker phone at bedside as well. Clear, nonlabored. No crackles RRR, no MRG, no pitting edema Alert, oriented, conversant.   Condition at discharge: good  The results of significant diagnostics from this hospitalization (including imaging, microbiology, ancillary and laboratory) are listed below for reference.   Imaging Studies: ECHOCARDIOGRAM COMPLETE  Result Date: 08/16/2022    ECHOCARDIOGRAM REPORT   Patient Name:   Jerry [name] Date of Exam: 08/16/2022 Medical Rec #:  [id number]        Height:       68.0 in Accession #:    [id number]       Weight:       250.9 lb Date of Birth:  07/16/1971        BSA:          2.250 m Patient Age:    28 years         BP:           126/108 mmHg Patient Gender: M                HR:           93 bpm. Exam Location:  Inpatient Procedure: 2D Echo Indications:    NSTEMI  History:        Patient has prior history of Echocardiogram examinations, most                 recent 01/30/2018. Risk Factors:Sleep Apnea.  Sonographer:    Johny Chess RDCS Referring Phys: [id number] [place]  Sonographer Comments: Patient is obese. Image acquisition challenging due to patient body habitus and snoring. IMPRESSIONS  1. Left ventricular ejection fraction, by estimation, is 45 to 50%. The left ventricle has mildly decreased function. The left ventricle demonstrates regional  wall motion abnormalities (see scoring diagram/findings for description). There is moderate concentric left ventricular hypertrophy. Left ventricular diastolic parameters are indeterminate.  2. Right ventricular systolic function is normal. The right ventricular size is normal. There is normal pulmonary artery systolic pressure.  3. There is no evidence of cardiac tamponade.  4. The mitral valve is normal in structure. Trivial mitral valve regurgitation. No evidence of mitral stenosis.  5. The aortic valve is tricuspid. Aortic valve regurgitation is not visualized. No aortic stenosis is present.  6. Aortic dilatation noted. There is mild  dilatation of the ascending aorta, measuring 39 mm.  7. The inferior vena cava is normal in size with greater than 50% respiratory variability, suggesting right atrial pressure of 3 mmHg. FINDINGS  Left Ventricle: Left ventricular ejection fraction, by estimation, is 45 to 50%. The left ventricle has mildly decreased function. The left ventricle demonstrates regional wall motion abnormalities. The left ventricular internal cavity size was normal in size. There is moderate concentric left ventricular hypertrophy. Left ventricular diastolic parameters are indeterminate.  LV Wall Scoring: The mid and distal anterior wall and entire lateral wall are hypokinetic. The entire septum, entire inferior wall, basal anterior segment, and apex are normal. Right Ventricle: The right ventricular size is normal. No increase in right ventricular wall thickness. Right ventricular systolic function is normal. There is normal pulmonary artery systolic pressure. The tricuspid regurgitant velocity is 1.44 m/s, and  with an assumed right atrial pressure of 3 mmHg, the estimated right ventricular systolic pressure is 58.8 mmHg. Left Atrium: Left atrial size was normal in size. Right Atrium: Right atrial size was normal in size. Pericardium: Trivial pericardial effusion is present. There is no evidence of  cardiac tamponade. Mitral Valve: The mitral valve is normal in structure. Trivial mitral valve regurgitation. No evidence of mitral valve stenosis. Tricuspid Valve: The tricuspid valve is normal in structure. Tricuspid valve regurgitation is trivial. No evidence of tricuspid stenosis. Aortic Valve: The aortic valve is tricuspid. Aortic valve regurgitation is not visualized. No aortic stenosis is present. Pulmonic Valve: The pulmonic valve was normal in structure. Pulmonic valve regurgitation is not visualized. No evidence of pulmonic stenosis. Aorta: Aortic dilatation noted. There is mild dilatation of the ascending aorta, measuring 39 mm. Venous: The inferior vena cava is normal in size with greater than 50% respiratory variability, suggesting right atrial pressure of 3 mmHg. IAS/Shunts: No atrial level shunt detected by color flow Doppler.  LEFT VENTRICLE PLAX 2D LVIDd:         5.80 cm LVIDs:         4.20 cm LV PW:         1.30 cm LV IVS:        1.50 cm LVOT diam:     2.40 cm LV SV:         59 LV SV Index:   26 LVOT Area:     4.52 cm  LV Volumes (MOD) LV vol d, MOD A4C: 184.0 ml LV vol s, MOD A4C: 93.9 ml LV SV MOD A4C:     184.0 ml RIGHT VENTRICLE             IVC RV Basal diam:  2.90 cm     IVC diam: 1.60 cm RV S prime:     14.60 cm/s TAPSE (M-mode): 1.3 cm LEFT ATRIUM             Index        RIGHT ATRIUM           Index LA diam:        3.60 cm 1.60 cm/m   RA Area:     12.30 cm LA Vol (A2C):   47.5 ml 21.11 ml/m  RA Volume:   27.80 ml  12.35 ml/m LA Vol (A4C):   55.9 ml 24.84 ml/m LA Biplane Vol: 54.1 ml 24.04 ml/m  AORTIC VALVE LVOT Vmax:   74.10 cm/s LVOT Vmean:  48.400 cm/s LVOT VTI:    0.131 m  AORTA Ao Root diam: 3.50 cm Ao Asc diam:  3.90 cm TRICUSPID VALVE TR Peak grad:   8.3 mmHg TR Vmax:        144.00 cm/s  SHUNTS Systemic VTI:  0.13 m Systemic Diam: 2.40 cm Skeet Latch MD Electronically signed by Skeet Latch MD Signature Date/Time: 08/16/2022/12:09:05 PM    Final    DG Chest 2  View  Result Date: 08/16/2022 CLINICAL DATA:  Chest pain EXAM: CHEST - 2 VIEW COMPARISON:  08/12/2022 FINDINGS: Left IJ dual-lumen catheter tip at the superior cavoatrial junction. Stable cardiomegaly. Improved predominantly basilar airspace opacities since 08/12/2022. Slight decrease in right pleural effusion. IMPRESSION: Improving airspace opacities compared to 08/12/2022. Small right pleural effusion. Cardiomegaly. Electronically Signed   By: Placido Sou M.D.   On: 08/16/2022 02:40   DG Chest 2 View  Result Date: 08/12/2022 CLINICAL DATA:  Chest pain. EXAM: CHEST - 2 VIEW COMPARISON:  05/23/2020 FINDINGS: Left jugular double-lumen catheter with its tip in the upper right atrium near the superior cavoatrial junction. Enlarged cardiac silhouette with an interval increase in size. Tortuous aorta. Small bilateral pleural effusions, increased. Patchy density in both lower lung zones, greater on the right. Diffusely prominent interstitial markings without Kerley lines. Mildly prominent pulmonary vasculature. Unremarkable bones. IMPRESSION: 1. Cardiomegaly and mild pulmonary vascular congestion. 2. Small bilateral pleural effusions, increased. 3. Patchy atelectasis, pneumonia or alveolar edema in both lower lung zones, greater on the right. 4. Chronic interstitial lung disease. Electronically Signed   By: Claudie Revering M.D.   On: 08/12/2022 18:21    Microbiology: Results for orders placed or performed during the hospital encounter of 11/08/20  SARS CORONAVIRUS 2 (TAT 6-24 HRS) Nasopharyngeal Nasopharyngeal Swab     Status: None   Collection Time: 11/08/20 12:51 PM   Specimen: Nasopharyngeal Swab  Result Value Ref Range Status   SARS Coronavirus 2 NEGATIVE NEGATIVE Final    Comment: (NOTE) SARS-CoV-2 target nucleic acids are NOT DETECTED.  The SARS-CoV-2 RNA is generally detectable in upper and lower respiratory specimens during the acute phase of infection. Negative results do not preclude  SARS-CoV-2 infection, do not rule out co-infections with other pathogens, and should not be used as the sole basis for treatment or other patient management decisions. Negative results must be combined with clinical observations, patient history, and epidemiological information. The expected result is Negative.  Fact Sheet for Patients: SugarRoll.be  Fact Sheet for Healthcare Providers: https://www.woods-mathews.com/  This test is not yet approved or cleared by the [place] FDA and  has been authorized for detection and/or diagnosis of SARS-CoV-2 by FDA under an Emergency Use Authorization (EUA). This EUA will remain  in effect (meaning this test can be used) for the duration of the COVID-19 declaration under Se ction 564(b)(1) of the Act, 21 U.S.C. section 360bbb-3(b)(1), unless the authorization is terminated or revoked sooner.  Performed at [facility], [place] [address], [place], [place] [address]     Labs: CBC: Recent Labs  Lab 08/13/22 0507 08/16/22 0205 08/16/22 2043 08/17/22 0458  WBC 6.9 7.3 8.0 7.3  HGB 11.8* 11.7* 13.0 12.8*  HCT 38.1* 37.3* 41.1 39.5  MCV 95.3 96.9 94.3 94.5  PLT 323 302 344 025   Basic Metabolic Panel: Recent Labs  Lab 08/12/22 1742 08/12/22 1803 08/13/22 0700 08/16/22 0205 08/16/22 0301 08/17/22 0458  NA 136  --  135  --  136 135  K 5.7*  --  3.5  --  4.2 4.3  CL 100  --  93*  --  97* 96*  CO2 18*  --  26  --  23 26  GLUCOSE 89  --  94  --  105* 98  BUN 56*  --  31*  --  63* 38*  CREATININE 10.97*  --  7.39*  --  10.83* 8.32*  CALCIUM 8.5*  --  8.7*  --  9.5 9.0  MG  --  2.4  --  2.6*  --   --   PHOS  --   --  4.8*  --   --   --    Liver Function Tests: Recent Labs  Lab 08/13/22 0700  ALBUMIN 4.0   CBG: No results for input(s): "GLUCAP" in the last 168 hours.  Discharge time spent: greater than 30 minutes.  Signed: Patrecia Pour, MD [facility] 08/17/2022

## 2022-08-17 NOTE — Progress Notes (Signed)
[facility] Progress Note   Subjective:  Seen in room - did fine with HD, got down to 112kg. We talked about where his new dry weight should be -- he thinks he has more fluid to get off. Diastolic BP still high. He thinks he could "easily" get to 110kg - will change this for discharge. No CP/dyspnea today. Reviewed cardiology notes - plan is for f/u with cards as outpatient and possibly for LHC in near future.  Objective Vitals:   08/16/22 2059 08/17/22 0007 08/17/22 0440 08/17/22 0729  BP: 133/89 105/82 (!) 137/96 (!) 122/90  Pulse: 96 (!) 106 93 99  Resp: 15 14 13 16   Temp: 98.8 F (37.1 C) 98.4 F (36.9 C) 98 F (36.7 C) 98.4 F (36.9 C)  TempSrc: Oral Oral Oral Oral  SpO2: 97% 95% 100% 100%  Weight: 112.1 kg     Height: 5\' 8"  (1.727 m)      Physical Exam General: Well appearing man, NAD. Nasal O2 in place Heart: RRR; no murmur Lungs: CTAB; no rales Abdomen: soft, non-tender Extremities: No LE edema Dialysis Access:  L [facility]  Additional Objective Labs: Basic Metabolic Panel: Recent Labs  Lab 08/13/22 0700 08/16/22 0301 08/17/22 0458  NA 135 136 135  K 3.5 4.2 4.3  CL 93* 97* 96*  CO2 26 23 26   GLUCOSE 94 105* 98  BUN 31* 63* 38*  CREATININE 7.39* 10.83* 8.32*  CALCIUM 8.7* 9.5 9.0  PHOS 4.8*  --   --    Liver Function Tests: Recent Labs  Lab 08/13/22 0700  ALBUMIN 4.0   No results for input(s): "LIPASE", "AMYLASE" in the last 168 hours. CBC: Recent Labs  Lab 08/13/22 0507 08/16/22 0205 08/16/22 2043 08/17/22 0458  WBC 6.9 7.3 8.0 7.3  HGB 11.8* 11.7* 13.0 12.8*  HCT 38.1* 37.3* 41.1 39.5  MCV 95.3 96.9 94.3 94.5  PLT 323 302 344 326   Blood Culture    Component Value Date/Time   SDES BLOOD LEFT ANTECUBITAL 02/03/2020 0415   SPECREQUEST  02/03/2020 0415    BOTTLES DRAWN AEROBIC AND ANAEROBIC Blood Culture adequate volume   CULT (A) 02/03/2020 0415    MICROCOCCUS SPECIES Standardized susceptibility testing for this organism is  not available. Performed at [facility], [place] [address], [place], [place] [address]    REPTSTATUS 02/05/2020 FINAL 02/03/2020 0415   Iron Studies:  Recent Labs    08/16/22 1400  IRON 35*  TIBC 276  FERRITIN 932*   @[email or web address] @ Studies/Results: ECHOCARDIOGRAM COMPLETE  Result Date: 08/16/2022    ECHOCARDIOGRAM REPORT   Patient Name:   Jerry [name] Date of Exam: 08/16/2022 Medical Rec #:  [id number]        Height:       68.0 in Accession #:    [id number]       Weight:       250.9 lb Date of Birth:  16-Oct-1970        BSA:          2.250 m Patient Age:    52 years         BP:           126/108 mmHg Patient Gender: M                HR:           93 bpm. Exam Location:  Inpatient Procedure: 2D Echo Indications:    NSTEMI  History:  Patient has prior history of Echocardiogram examinations, most                 recent 01/30/2018. Risk Factors:Sleep Apnea.  Sonographer:    Johny Chess RDCS Referring Phys: [id number] [place]  Sonographer Comments: Patient is obese. Image acquisition challenging due to patient body habitus and snoring. IMPRESSIONS  1. Left ventricular ejection fraction, by estimation, is 45 to 50%. The left ventricle has mildly decreased function. The left ventricle demonstrates regional wall motion abnormalities (see scoring diagram/findings for description). There is moderate concentric left ventricular hypertrophy. Left ventricular diastolic parameters are indeterminate.  2. Right ventricular systolic function is normal. The right ventricular size is normal. There is normal pulmonary artery systolic pressure.  3. There is no evidence of cardiac tamponade.  4. The mitral valve is normal in structure. Trivial mitral valve regurgitation. No evidence of mitral stenosis.  5. The aortic valve is tricuspid. Aortic valve regurgitation is not visualized. No aortic stenosis is present.  6. Aortic dilatation noted. There is mild dilatation of the ascending aorta, measuring 39  mm.  7. The inferior vena cava is normal in size with greater than 50% respiratory variability, suggesting right atrial pressure of 3 mmHg. FINDINGS  Left Ventricle: Left ventricular ejection fraction, by estimation, is 45 to 50%. The left ventricle has mildly decreased function. The left ventricle demonstrates regional wall motion abnormalities. The left ventricular internal cavity size was normal in size. There is moderate concentric left ventricular hypertrophy. Left ventricular diastolic parameters are indeterminate.  LV Wall Scoring: The mid and distal anterior wall and entire lateral wall are hypokinetic. The entire septum, entire inferior wall, basal anterior segment, and apex are normal. Right Ventricle: The right ventricular size is normal. No increase in right ventricular wall thickness. Right ventricular systolic function is normal. There is normal pulmonary artery systolic pressure. The tricuspid regurgitant velocity is 1.44 m/s, and  with an assumed right atrial pressure of 3 mmHg, the estimated right ventricular systolic pressure is 97.5 mmHg. Left Atrium: Left atrial size was normal in size. Right Atrium: Right atrial size was normal in size. Pericardium: Trivial pericardial effusion is present. There is no evidence of cardiac tamponade. Mitral Valve: The mitral valve is normal in structure. Trivial mitral valve regurgitation. No evidence of mitral valve stenosis. Tricuspid Valve: The tricuspid valve is normal in structure. Tricuspid valve regurgitation is trivial. No evidence of tricuspid stenosis. Aortic Valve: The aortic valve is tricuspid. Aortic valve regurgitation is not visualized. No aortic stenosis is present. Pulmonic Valve: The pulmonic valve was normal in structure. Pulmonic valve regurgitation is not visualized. No evidence of pulmonic stenosis. Aorta: Aortic dilatation noted. There is mild dilatation of the ascending aorta, measuring 39 mm. Venous: The inferior vena cava is normal in  size with greater than 50% respiratory variability, suggesting right atrial pressure of 3 mmHg. IAS/Shunts: No atrial level shunt detected by color flow Doppler.  LEFT VENTRICLE PLAX 2D LVIDd:         5.80 cm LVIDs:         4.20 cm LV PW:         1.30 cm LV IVS:        1.50 cm LVOT diam:     2.40 cm LV SV:         59 LV SV Index:   26 LVOT Area:     4.52 cm  LV Volumes (MOD) LV vol d, MOD A4C: 184.0 ml LV vol s, MOD  A4C: 93.9 ml LV SV MOD A4C:     184.0 ml RIGHT VENTRICLE             IVC RV Basal diam:  2.90 cm     IVC diam: 1.60 cm RV S prime:     14.60 cm/s TAPSE (M-mode): 1.3 cm LEFT ATRIUM             Index        RIGHT ATRIUM           Index LA diam:        3.60 cm 1.60 cm/m   RA Area:     12.30 cm LA Vol (A2C):   47.5 ml 21.11 ml/m  RA Volume:   27.80 ml  12.35 ml/m LA Vol (A4C):   55.9 ml 24.84 ml/m LA Biplane Vol: 54.1 ml 24.04 ml/m  AORTIC VALVE LVOT Vmax:   74.10 cm/s LVOT Vmean:  48.400 cm/s LVOT VTI:    0.131 m  AORTA Ao Root diam: 3.50 cm Ao Asc diam:  3.90 cm TRICUSPID VALVE TR Peak grad:   8.3 mmHg TR Vmax:        144.00 cm/s  SHUNTS Systemic VTI:  0.13 m Systemic Diam: 2.40 cm Skeet Latch MD Electronically signed by Skeet Latch MD Signature Date/Time: 08/16/2022/12:09:05 PM    Final    DG Chest 2 View  Result Date: 08/16/2022 CLINICAL DATA:  Chest pain EXAM: CHEST - 2 VIEW COMPARISON:  08/12/2022 FINDINGS: Left IJ dual-lumen catheter tip at the superior cavoatrial junction. Stable cardiomegaly. Improved predominantly basilar airspace opacities since 08/12/2022. Slight decrease in right pleural effusion. IMPRESSION: Improving airspace opacities compared to 08/12/2022. Small right pleural effusion. Cardiomegaly. Electronically Signed   By: Placido Sou M.D.   On: 08/16/2022 02:40   Medications:  heparin 1,350 Units/hr (08/17/22 0906)    aspirin EC  81 mg Oral Daily   atorvastatin  40 mg Oral Daily   losartan  50 mg Oral Daily   metoprolol tartrate  12.5 mg Oral BID     Dialysis Orders: MWF at [facility] 4:15hr, 400/500, EDW 114.5kg, 1K/2.5Ca, TDC, no heparin - Hectoral 5cmg Iv q HD - Getting course IV iron - no ESA   Assessment/Plan: 1. Chest pain: + NSTEMI. Trop 786 -> 697 -> 471, Echo with LVEF 45-50% and small WMA - will need outpatient follow up to consider LHC.  2. ESRD: On MWF schedule - next tomorrow as outpatient, challenging EDW - got to 112kg yesterday and he thinks can get down lower - will adjust on discharge. Has been changing diet and losing weight, will change back to 2K bath as well with weekly checks. 3. HTN/volume: BP coming down, UF as tolerated. Stay on BB + ARB. 4. Anemia:  Hgb > 11, no ESA for now. 5. Secondary hyperparathyroidism: Ca/Phos ok - continue home meds.  [name], PA-C 08/17/2022, 10:21 AM  [employer]

## 2022-08-17 NOTE — Progress Notes (Signed)
D/C order noted. Contacted [facility] to advise clinic of pt's d/c today and pt should resume care tomorrow.   Melven Sartorius Renal Navigator [phone]

## 2022-08-17 NOTE — Progress Notes (Signed)
Subjective:  Patient denies any chest pain or shortness of breath states feels much better after hemodialysis's troponin I are trending down discussed with patient various options of treatment and wanted to be treated medically only  Objective:  Vital Signs in the last 24 hours: Temp:  [97.7 F (36.5 C)-98.8 F (37.1 C)] 98.4 F (36.9 C) (02/01 0729) Pulse Rate:  [69-106] 99 (02/01 0729) Resp:  [12-24] 16 (02/01 0729) BP: (105-163)/(82-130) 122/90 (02/01 0729) SpO2:  [93 %-100 %] 100 % (02/01 0729) Weight:  [112.1 kg-115.5 kg] 112.1 kg (01/31 2059)  Intake/Output from previous day: 01/31 0701 - 02/01 0700 In: 55.6 [I.V.:55.6] Out: 2902.9  Intake/Output from this shift: Total I/O In: 308.9 [P.O.:240; I.V.:68.9] Out: -   Physical Exam: Exam unchanged  Lab Results: Recent Labs    08/16/22 2043 08/17/22 0458  WBC 8.0 7.3  HGB 13.0 12.8*  PLT 344 326   Recent Labs    08/16/22 0301 08/17/22 0458  NA 136 135  K 4.2 4.3  CL 97* 96*  CO2 23 26  GLUCOSE 105* 98  BUN 63* 38*  CREATININE 10.83* 8.32*   No results for input(s): "TROPONINI" in the last 72 hours.  Invalid input(s): "CK", "MB" Hepatic Function Panel No results for input(s): "PROT", "ALBUMIN", "AST", "ALT", "ALKPHOS", "BILITOT", "BILIDIR", "IBILI" in the last 72 hours. Recent Labs    08/16/22 1400  CHOL 161   No results for input(s): "PROTIME" in the last 72 hours.  Imaging: Imaging results have been reviewed and ECHOCARDIOGRAM COMPLETE  Result Date: 08/16/2022    ECHOCARDIOGRAM REPORT   Patient Name:   Jerry [name] Date of Exam: 08/16/2022 Medical Rec #:  [id number]        Height:       68.0 in Accession #:    [id number]       Weight:       250.9 lb Date of Birth:  Jul 04, 1971        BSA:          2.250 m Patient Age:    52 years         BP:           126/108 mmHg Patient Gender: M                HR:           93 bpm. Exam Location:  Inpatient Procedure: 2D Echo Indications:    NSTEMI  History:         Patient has prior history of Echocardiogram examinations, most                 recent 01/30/2018. Risk Factors:Sleep Apnea.  Sonographer:    Johny Chess RDCS Referring Phys: [id number] [place]  Sonographer Comments: Patient is obese. Image acquisition challenging due to patient body habitus and snoring. IMPRESSIONS  1. Left ventricular ejection fraction, by estimation, is 45 to 50%. The left ventricle has mildly decreased function. The left ventricle demonstrates regional wall motion abnormalities (see scoring diagram/findings for description). There is moderate concentric left ventricular hypertrophy. Left ventricular diastolic parameters are indeterminate.  2. Right ventricular systolic function is normal. The right ventricular size is normal. There is normal pulmonary artery systolic pressure.  3. There is no evidence of cardiac tamponade.  4. The mitral valve is normal in structure. Trivial mitral valve regurgitation. No evidence of mitral stenosis.  5. The aortic valve is tricuspid. Aortic valve regurgitation is not visualized. No aortic stenosis  is present.  6. Aortic dilatation noted. There is mild dilatation of the ascending aorta, measuring 39 mm.  7. The inferior vena cava is normal in size with greater than 50% respiratory variability, suggesting right atrial pressure of 3 mmHg. FINDINGS  Left Ventricle: Left ventricular ejection fraction, by estimation, is 45 to 50%. The left ventricle has mildly decreased function. The left ventricle demonstrates regional wall motion abnormalities. The left ventricular internal cavity size was normal in size. There is moderate concentric left ventricular hypertrophy. Left ventricular diastolic parameters are indeterminate.  LV Wall Scoring: The mid and distal anterior wall and entire lateral wall are hypokinetic. The entire septum, entire inferior wall, basal anterior segment, and apex are normal. Right Ventricle: The right ventricular size is normal. No  increase in right ventricular wall thickness. Right ventricular systolic function is normal. There is normal pulmonary artery systolic pressure. The tricuspid regurgitant velocity is 1.44 m/s, and  with an assumed right atrial pressure of 3 mmHg, the estimated right ventricular systolic pressure is 24.4 mmHg. Left Atrium: Left atrial size was normal in size. Right Atrium: Right atrial size was normal in size. Pericardium: Trivial pericardial effusion is present. There is no evidence of cardiac tamponade. Mitral Valve: The mitral valve is normal in structure. Trivial mitral valve regurgitation. No evidence of mitral valve stenosis. Tricuspid Valve: The tricuspid valve is normal in structure. Tricuspid valve regurgitation is trivial. No evidence of tricuspid stenosis. Aortic Valve: The aortic valve is tricuspid. Aortic valve regurgitation is not visualized. No aortic stenosis is present. Pulmonic Valve: The pulmonic valve was normal in structure. Pulmonic valve regurgitation is not visualized. No evidence of pulmonic stenosis. Aorta: Aortic dilatation noted. There is mild dilatation of the ascending aorta, measuring 39 mm. Venous: The inferior vena cava is normal in size with greater than 50% respiratory variability, suggesting right atrial pressure of 3 mmHg. IAS/Shunts: No atrial level shunt detected by color flow Doppler.  LEFT VENTRICLE PLAX 2D LVIDd:         5.80 cm LVIDs:         4.20 cm LV PW:         1.30 cm LV IVS:        1.50 cm LVOT diam:     2.40 cm LV SV:         59 LV SV Index:   26 LVOT Area:     4.52 cm  LV Volumes (MOD) LV vol d, MOD A4C: 184.0 ml LV vol s, MOD A4C: 93.9 ml LV SV MOD A4C:     184.0 ml RIGHT VENTRICLE             IVC RV Basal diam:  2.90 cm     IVC diam: 1.60 cm RV S prime:     14.60 cm/s TAPSE (M-mode): 1.3 cm LEFT ATRIUM             Index        RIGHT ATRIUM           Index LA diam:        3.60 cm 1.60 cm/m   RA Area:     12.30 cm LA Vol (A2C):   47.5 ml 21.11 ml/m  RA Volume:    27.80 ml  12.35 ml/m LA Vol (A4C):   55.9 ml 24.84 ml/m LA Biplane Vol: 54.1 ml 24.04 ml/m  AORTIC VALVE LVOT Vmax:   74.10 cm/s LVOT Vmean:  48.400 cm/s LVOT VTI:    0.131 m  AORTA Ao Root diam: 3.50 cm Ao Asc diam:  3.90 cm TRICUSPID VALVE TR Peak grad:   8.3 mmHg TR Vmax:        144.00 cm/s  SHUNTS Systemic VTI:  0.13 m Systemic Diam: 2.40 cm Skeet Latch MD Electronically signed by Skeet Latch MD Signature Date/Time: 08/16/2022/12:09:05 PM    Final    DG Chest 2 View  Result Date: 08/16/2022 CLINICAL DATA:  Chest pain EXAM: CHEST - 2 VIEW COMPARISON:  08/12/2022 FINDINGS: Left IJ dual-lumen catheter tip at the superior cavoatrial junction. Stable cardiomegaly. Improved predominantly basilar airspace opacities since 08/12/2022. Slight decrease in right pleural effusion. IMPRESSION: Improving airspace opacities compared to 08/12/2022. Small right pleural effusion. Cardiomegaly. Electronically Signed   By: Placido Sou M.D.   On: 08/16/2022 02:40    Cardiac Studies:  Assessment/Plan:  Atypical chest pain with minimally elevated troponin cannot rule out small NSTEMI Hypertension End-stage renal disease on hemodialysis Mild volume overload GERD Obstructive sleep apnea Anemia of chronic disease Plan Continue present management Okay to discharge from cardiac point of view Follow-up with me in 1 to 2 weeks  LOS: 1 day    Charolette Forward 08/17/2022, 10:04 AM

## 2022-08-19 ENCOUNTER — Emergency Department (HOSPITAL_COMMUNITY): Payer: Medicare Other

## 2022-08-19 ENCOUNTER — Telehealth: Payer: Self-pay | Admitting: Nephrology

## 2022-08-19 ENCOUNTER — Other Ambulatory Visit: Payer: Self-pay

## 2022-08-19 ENCOUNTER — Inpatient Hospital Stay (HOSPITAL_COMMUNITY)
Admission: EM | Admit: 2022-08-19 | Discharge: 2022-08-23 | DRG: 321 | Disposition: A | Discharge status: Home / Self Care | Payer: Medicare Other | Attending: Internal Medicine | Admitting: Internal Medicine

## 2022-08-19 DIAGNOSIS — R58 Hemorrhage, not elsewhere classified: Secondary | ICD-10-CM

## 2022-08-19 DIAGNOSIS — I132 Hypertensive heart and chronic kidney disease with heart failure and with stage 5 chronic kidney disease, or end stage renal disease: Secondary | ICD-10-CM | POA: Diagnosis present

## 2022-08-19 DIAGNOSIS — I25110 Atherosclerotic heart disease of native coronary artery with unstable angina pectoris: Secondary | ICD-10-CM | POA: Diagnosis not present

## 2022-08-19 DIAGNOSIS — K219 Gastro-esophageal reflux disease without esophagitis: Secondary | ICD-10-CM | POA: Diagnosis present

## 2022-08-19 DIAGNOSIS — E669 Obesity, unspecified: Secondary | ICD-10-CM | POA: Diagnosis present

## 2022-08-19 DIAGNOSIS — D631 Anemia in chronic kidney disease: Secondary | ICD-10-CM | POA: Diagnosis present

## 2022-08-19 DIAGNOSIS — I214 Non-ST elevation (NSTEMI) myocardial infarction: Secondary | ICD-10-CM | POA: Diagnosis present

## 2022-08-19 DIAGNOSIS — I472 Ventricular tachycardia, unspecified: Secondary | ICD-10-CM | POA: Diagnosis not present

## 2022-08-19 DIAGNOSIS — Z841 Family history of disorders of kidney and ureter: Secondary | ICD-10-CM

## 2022-08-19 DIAGNOSIS — G4733 Obstructive sleep apnea (adult) (pediatric): Secondary | ICD-10-CM | POA: Diagnosis present

## 2022-08-19 DIAGNOSIS — Z91048 Other nonmedicinal substance allergy status: Secondary | ICD-10-CM

## 2022-08-19 DIAGNOSIS — Z940 Kidney transplant status: Secondary | ICD-10-CM

## 2022-08-19 DIAGNOSIS — Z8249 Family history of ischemic heart disease and other diseases of the circulatory system: Secondary | ICD-10-CM

## 2022-08-19 DIAGNOSIS — I5022 Chronic systolic (congestive) heart failure: Secondary | ICD-10-CM | POA: Diagnosis present

## 2022-08-19 DIAGNOSIS — N2581 Secondary hyperparathyroidism of renal origin: Secondary | ICD-10-CM | POA: Diagnosis present

## 2022-08-19 DIAGNOSIS — N186 End stage renal disease: Secondary | ICD-10-CM | POA: Diagnosis present

## 2022-08-19 DIAGNOSIS — Z992 Dependence on renal dialysis: Secondary | ICD-10-CM

## 2022-08-19 DIAGNOSIS — Z7982 Long term (current) use of aspirin: Secondary | ICD-10-CM

## 2022-08-19 DIAGNOSIS — Z6838 Body mass index (BMI) 38.0-38.9, adult: Secondary | ICD-10-CM

## 2022-08-19 DIAGNOSIS — Z87891 Personal history of nicotine dependence: Secondary | ICD-10-CM

## 2022-08-19 DIAGNOSIS — I160 Hypertensive urgency: Principal | ICD-10-CM | POA: Diagnosis present

## 2022-08-19 DIAGNOSIS — R0902 Hypoxemia: Secondary | ICD-10-CM | POA: Diagnosis present

## 2022-08-19 DIAGNOSIS — Z888 Allergy status to other drugs, medicaments and biological substances status: Secondary | ICD-10-CM

## 2022-08-19 DIAGNOSIS — R079 Chest pain, unspecified: Secondary | ICD-10-CM | POA: Diagnosis present

## 2022-08-19 DIAGNOSIS — Y831 Surgical operation with implant of artificial internal device as the cause of abnormal reaction of the patient, or of later complication, without mention of misadventure at the time of the procedure: Secondary | ICD-10-CM | POA: Diagnosis not present

## 2022-08-19 DIAGNOSIS — I97618 Postprocedural hemorrhage and hematoma of a circulatory system organ or structure following other circulatory system procedure: Secondary | ICD-10-CM | POA: Diagnosis not present

## 2022-08-19 DIAGNOSIS — Z79899 Other long term (current) drug therapy: Secondary | ICD-10-CM

## 2022-08-19 DIAGNOSIS — I252 Old myocardial infarction: Secondary | ICD-10-CM

## 2022-08-19 DIAGNOSIS — Z955 Presence of coronary angioplasty implant and graft: Secondary | ICD-10-CM

## 2022-08-19 LAB — CBC
HCT: 38.1 % — ABNORMAL LOW (ref 39.0–52.0)
Hemoglobin: 11.8 g/dL — ABNORMAL LOW (ref 13.0–17.0)
MCH: 30.3 pg (ref 26.0–34.0)
MCHC: 31 g/dL (ref 30.0–36.0)
MCV: 97.9 fL (ref 80.0–100.0)
Platelets: 299 10*3/uL (ref 150–400)
RBC: 3.89 MIL/uL — ABNORMAL LOW (ref 4.22–5.81)
RDW: 14.8 % (ref 11.5–15.5)
WBC: 9.8 10*3/uL (ref 4.0–10.5)
nRBC: 0 % (ref 0.0–0.2)

## 2022-08-19 LAB — CBG MONITORING, ED: Glucose-Capillary: 107 mg/dL — ABNORMAL HIGH (ref 70–99)

## 2022-08-19 MED ORDER — NITROGLYCERIN 2 % TD OINT
1.0000 [in_us] | TOPICAL_OINTMENT | Freq: Once | TRANSDERMAL | Status: AC
  Administered 2022-08-20: 1 [in_us] via TOPICAL
  Filled 2022-08-19: qty 1

## 2022-08-19 NOTE — Telephone Encounter (Signed)
Transition of care contact from inpatient facility  Date of Discharge: 08/17/22 Date of Contact: attempted  x 2 08/19/22 Method of contact: Phone  Attempted to contact patient to discuss transition of care from inpatient admission. Patient did not answer the phone. Message was left on the patient's voicemail  and will have follow up at his kidney center  on [facility] schedule

## 2022-08-19 NOTE — ED Notes (Signed)
6 beat run pf vtach noted on monitor, strip printed and MD lawsing aware. Pt denies CP, dizziness or SOB at this time.

## 2022-08-19 NOTE — ED Triage Notes (Signed)
Pt BIBA to ED for left sided CP. Pt was here yesterday for same and discharged. Pt reporting CP worsens when he talks. Reports SOB and light headedness. Reports pain is better with hand above head.

## 2022-08-19 NOTE — ED Provider Notes (Signed)
[address] [facility] [facility] Provider Note  CSN: [id number] Arrival date & time: 08/19/22 2152  Chief Complaint(s) Chest Pain  HPI Jerry [name] is a 52 y.o. male with a past medical history listed below including hypertension, ESRD on dialysis MWF through a left chest permacath who was recently admitted for NSTEMI and discharged 2 days ago with cardiology follow-up for cath consideration.  He returns today for left-sided chest pain similar to his prior pain.  He is endorsing shortness of breath.  There are several components of the chest pain including left shoulder pain that radiates around the left side into the chest.  This is improved with palpating muscle spasm in the left shoulder.  In addition to this patient is endorsing left-sided chest pain with the shortness of breath.  It appears to be related to elevated blood pressures.  He is compliant with his medication and was recently started on metoprolol as well.  He denies any fevers or chills.  No coughing or congestion.  States that he is compliant with his dialysis and last went on Friday.  He did report that 3 weeks ago they increased his dry weight, but this was corrected during his last admission after the noted a decrease in his EF.   Chest Pain   Past Medical History Past Medical History:  Diagnosis Date   Complication of anesthesia    "benadryl knocks me out; worse than any normal reactions; anesthesia/numbing RX wear off FAST" (01/30/2018) 2021 Took 3 weeks to able talk and eat soild food due to throat    ESRD (end stage renal disease) on dialysis ([place])    "HAD DIALYSIS BEFORE TRANSPLANT; RESTARTED DIALYSIS 01/30/2018)-  [facility] - MWF   GERD (gastroesophageal reflux disease)    History of blood transfusion    Hypertension    OSA on CPAP    Pneumonia    Patient Active Problem List   Diagnosis Date Noted   Chest pain 08/20/2022   NSTEMI (non-ST elevated myocardial infarction) ([place]) 08/16/2022   Hypertensive  emergency 08/12/2022   Hypocalcemia 09/24/2020   Status post surgery 05/23/2020   ESRD (end stage renal disease) on dialysis ([place]) 05/23/2020   Hemorrhage due to vascular prosthetic devices, implants and grafts, initial encounter ([place]) 05/22/2020   Allergy, unspecified, initial encounter 03/10/2020   Anaphylactic shock, unspecified, initial encounter 03/10/2020   Methicillin susceptible Staphylococcus aureus infection, unspecified site 02/05/2020   Coagulation defect, unspecified ([place]) 02/04/2020   Cellulitis 02/02/2020   Right arm cellulitis 02/02/2020   Fluid overload, unspecified 06/13/2019   Headache, unspecified 05/16/2019   Shortness of breath 05/09/2019   Urinary tract infection, site not specified 04/25/2019   Encounter for immunization 04/09/2019   Bacterial infection, unspecified 01/17/2019   Encounter for removal of sutures 11/14/2018   Anemia in chronic kidney disease 02/05/2018   End stage renal disease ([place]) 02/05/2018   Iron deficiency anemia, unspecified 02/05/2018   Other secondary hypertension 02/05/2018   Secondary hyperparathyroidism of renal origin ([place]) 02/05/2018   ARF (acute renal failure) ([place]) 01/29/2018   Hypertensive urgency 01/29/2018   Anemia 01/29/2018   Other and unspecified anterior pituitary hyperfunction 06/14/2009   Hypertrophy of breast 06/03/2009   Sperm absent 06/03/2009   Home Medication(s) Prior to Admission medications   Medication Sig Start Date End Date Taking? Authorizing Provider  acetaminophen (TYLENOL) 500 MG tablet Take 1,000 mg by mouth every 6 (six) hours as needed for mild pain.   Yes [provider]  aspirin EC 81  MG tablet Take 1 tablet (81 mg total) by mouth daily. Swallow whole. 08/18/22  Yes Patrecia Pour, MD  atorvastatin (LIPITOR) 40 MG tablet Take 1 tablet (40 mg total) by mouth daily. 08/18/22  Yes Patrecia Pour, MD  calcium acetate (PHOSLO) 667 MG capsule Take [phone] mg by mouth with breakfast, with lunch, and  with evening meal. Take 4 capsules (2,668 mg) by mouth with each meal & take 2 capsules (1,334 mg) by mouth with snacks 12/01/19  Yes [provider]  losartan (COZAAR) 25 MG tablet Take 25 mg by mouth at bedtime. 08/06/19  Yes [provider]  metoprolol succinate (TOPROL XL) 25 MG 24 hr tablet Take 0.5 tablets (12.5 mg total) by mouth daily. 08/17/22  Yes Patrecia Pour, MD  multivitamin (RENA-VIT) TABS tablet Take 1 tablet by mouth every evening.   Yes [provider]                                                                                                                                    Allergies Alcohol, Chlorhexidine, Other, and Tape  Review of Systems Review of Systems  Cardiovascular:  Positive for chest pain.   As noted in HPI  Physical Exam Vital Signs  I have reviewed the triage vital signs BP (!) 128/96 (BP Location: Right Arm)   Pulse 85   Temp 97.6 F (36.4 C) (Oral)   Resp 16   Ht 5\' 8"  (1.727 m)   Wt 114 kg   SpO2 99%   BMI 38.21 kg/m   Physical Exam Vitals reviewed.  Constitutional:      General: He is not in acute distress.    Appearance: He is well-developed. He is not diaphoretic.  HENT:     Head: Normocephalic and atraumatic.     Nose: Nose normal.  Eyes:     General: No scleral icterus.       Right eye: No discharge.        Left eye: No discharge.     Conjunctiva/sclera: Conjunctivae normal.     Pupils: Pupils are equal, round, and reactive to light.  Cardiovascular:     Rate and Rhythm: Normal rate and regular rhythm.     Heart sounds: No murmur heard.    No friction rub. No gallop.  Pulmonary:     Effort: Pulmonary effort is normal. No respiratory distress.     Breath sounds: Normal breath sounds. No stridor. No rales.  Chest:    Abdominal:     General: There is no distension.     Palpations: Abdomen is soft.     Tenderness: There is no abdominal tenderness.  Musculoskeletal:     Cervical back: Normal  range of motion and neck supple.     Thoracic back: Spasms and tenderness present.       Back:  Skin:    General: Skin is warm and dry.  Findings: No erythema or rash.  Neurological:     Mental Status: He is alert and oriented to person, place, and time.     ED Results and Treatments Labs (all labs ordered are listed, but only abnormal results are displayed) Labs Reviewed  BASIC METABOLIC PANEL - Abnormal; Notable for the following components:      Result Value   Glucose, Bld 106 (*)    BUN 61 (*)    Creatinine, Ser 11.08 (*)    Calcium 8.8 (*)    GFR, Estimated 5 (*)    Anion gap 16 (*)    All other components within normal limits  CBC - Abnormal; Notable for the following components:   RBC 3.89 (*)    Hemoglobin 11.8 (*)    HCT 38.1 (*)    All other components within normal limits  CBG MONITORING, ED - Abnormal; Notable for the following components:   Glucose-Capillary 107 (*)    All other components within normal limits  TROPONIN I (HIGH SENSITIVITY) - Abnormal; Notable for the following components:   Troponin I (High Sensitivity) 105 (*)    All other components within normal limits  TROPONIN I (HIGH SENSITIVITY) - Abnormal; Notable for the following components:   Troponin I (High Sensitivity) 108 (*)    All other components within normal limits  BRAIN NATRIURETIC PEPTIDE  HIV ANTIBODY (ROUTINE TESTING W REFLEX)                                                                                                                         EKG  EKG Interpretation  Date/Time:  Saturday August 19 2022 21:52:43 EST Ventricular Rate:  90 PR Interval:  200 QRS Duration: 100 QT Interval:  388 QTC Calculation: 475 R Axis:   15 Text Interpretation: Sinus rhythm Probable left atrial enlargement Confirmed by Addison Lank [phone]) on 08/20/2022 5:05:08 AM       Radiology DG Chest Portable 1 View  Result Date: 08/19/2022 CLINICAL DATA:  Left side chest pain EXAM: PORTABLE  CHEST 1 VIEW COMPARISON:  08/16/2022 FINDINGS: Left dialysis catheter remains in place, unchanged. Heart mildly enlarged, stable. Small right pleural effusion and right basilar opacity, similar to prior study. No confluent opacity on the left. No acute bony abnormality. IMPRESSION: Stable small right pleural effusion with right base atelectasis or infiltrate. Electronically Signed   By: Rolm Baptise M.D.   On: 08/19/2022 23:13    Medications Ordered in ED Medications  aspirin EC tablet 81 mg (has no administration in time range)  atorvastatin (LIPITOR) tablet 40 mg (has no administration in time range)  metoprolol succinate (TOPROL-XL) 24 hr tablet 12.5 mg (has no administration in time range)  losartan (COZAAR) tablet 25 mg (has no administration in time range)  calcium acetate (PHOSLO) capsule 2,668 mg (has no administration in time range)  multivitamin (RENA-VIT) tablet 1 tablet (has no administration in time range)  acetaminophen (TYLENOL) tablet 1,000 mg (has no administration in time  range)  hydrALAZINE (APRESOLINE) injection 10 mg (has no administration in time range)  ondansetron (ZOFRAN) injection 4 mg (has no administration in time range)  heparin injection 5,000 Units (has no administration in time range)  nitroGLYCERIN (NITROGLYN) 2 % ointment 1 inch (has no administration in time range)  calcium acetate (PHOSLO) capsule 1,334 mg (has no administration in time range)  nitroGLYCERIN (NITROGLYN) 2 % ointment 1 inch (1 inch Topical Given 08/20/22 0019)  losartan (COZAAR) tablet 25 mg (25 mg Oral Given 08/20/22 0153)                                                                                                                                     Procedures .1-3 Lead EKG Interpretation  Performed by: Fatima Blank, MD Authorized by: Fatima Blank, MD     Interpretation: normal     ECG rate:  95   ECG rate assessment: normal     Rhythm: sinus rhythm     Ectopy: none      Conduction: normal   .Critical Care  Performed by: Fatima Blank, MD Authorized by: Fatima Blank, MD   Critical care provider statement:    Critical care time (minutes):  45   Critical care time was exclusive of:  Separately billable procedures and treating other patients   Critical care was necessary to treat or prevent imminent or life-threatening deterioration of the following conditions:  Circulatory failure   Critical care was time spent personally by me on the following activities:  Development of treatment plan with patient or surrogate, discussions with consultants, evaluation of patient's response to treatment, examination of patient, obtaining history from patient or surrogate, review of old charts, re-evaluation of patient's condition, pulse oximetry, ordering and review of radiographic studies, ordering and review of laboratory studies and ordering and performing treatments and interventions   Care discussed with: admitting provider     (including critical care time)  Medical Decision Making / ED Course   Medical Decision Making Amount and/or Complexity of Data Reviewed External Data Reviewed: notes.    Details: Admission note mentioned in HPI Labs: ordered. Decision-making details documented in ED Course. Radiology: ordered and independent interpretation performed. Decision-making details documented in ED Course. ECG/medicine tests: ordered and independent interpretation performed. Decision-making details documented in ED Course.  Risk Prescription drug management. Decision regarding hospitalization.   This patient presents to the ED for concern of left sided chest pain with HTN, this involves an extensive number of treatment options, and is a complaint that carries with it a high risk of complications and morbidity.  The differential diagnosis including but not limited to ACS, hypertensive emergency/urgency, CHF exacerbation, pneumonia, pneumothorax.  There is a component of MSK, but this is not related HTN.  EKG without acute ischemic changes or evidence of pericarditis Initial troponin 105.  450s upon discharge.  Unsure if this is trending down or trending  up.  Delta troponin relatively flat. Chest x-ray with small right pleural effusion and likely atelectasis.  No pneumothorax. CBC with no leukocytosis.  Mild anemia. Metabolic panel without significant electrolyte derangement.  Consistent with his ESRD. BNP wnl. Improved from admission.  Nitroglycerin past applied and patient given home Losartan BP improved as did his pain.  Case discussed with Dr. Alcario Drought from the hospital service who agreed to admit patient for continued management of likely hypertensive urgency.          Final Clinical Impression(s) / ED Diagnoses Final diagnoses:  Hypertensive urgency           This chart was dictated using voice recognition software.  Despite best efforts to proofread,  errors can occur which can change the documentation meaning.    Fatima Blank, MD 08/20/22 [phone]

## 2022-08-20 ENCOUNTER — Encounter (HOSPITAL_COMMUNITY): Payer: Self-pay | Admitting: Internal Medicine

## 2022-08-20 DIAGNOSIS — Y831 Surgical operation with implant of artificial internal device as the cause of abnormal reaction of the patient, or of later complication, without mention of misadventure at the time of the procedure: Secondary | ICD-10-CM | POA: Diagnosis not present

## 2022-08-20 DIAGNOSIS — I25110 Atherosclerotic heart disease of native coronary artery with unstable angina pectoris: Secondary | ICD-10-CM | POA: Diagnosis present

## 2022-08-20 DIAGNOSIS — N186 End stage renal disease: Secondary | ICD-10-CM | POA: Diagnosis not present

## 2022-08-20 DIAGNOSIS — N2581 Secondary hyperparathyroidism of renal origin: Secondary | ICD-10-CM | POA: Diagnosis present

## 2022-08-20 DIAGNOSIS — I160 Hypertensive urgency: Secondary | ICD-10-CM | POA: Diagnosis present

## 2022-08-20 DIAGNOSIS — Z992 Dependence on renal dialysis: Secondary | ICD-10-CM | POA: Diagnosis not present

## 2022-08-20 DIAGNOSIS — D631 Anemia in chronic kidney disease: Secondary | ICD-10-CM | POA: Diagnosis present

## 2022-08-20 DIAGNOSIS — I132 Hypertensive heart and chronic kidney disease with heart failure and with stage 5 chronic kidney disease, or end stage renal disease: Secondary | ICD-10-CM | POA: Diagnosis present

## 2022-08-20 DIAGNOSIS — Z955 Presence of coronary angioplasty implant and graft: Secondary | ICD-10-CM | POA: Diagnosis not present

## 2022-08-20 DIAGNOSIS — Z841 Family history of disorders of kidney and ureter: Secondary | ICD-10-CM | POA: Diagnosis not present

## 2022-08-20 DIAGNOSIS — Z91048 Other nonmedicinal substance allergy status: Secondary | ICD-10-CM | POA: Diagnosis not present

## 2022-08-20 DIAGNOSIS — K219 Gastro-esophageal reflux disease without esophagitis: Secondary | ICD-10-CM | POA: Diagnosis present

## 2022-08-20 DIAGNOSIS — G4733 Obstructive sleep apnea (adult) (pediatric): Secondary | ICD-10-CM | POA: Diagnosis present

## 2022-08-20 DIAGNOSIS — Z888 Allergy status to other drugs, medicaments and biological substances status: Secondary | ICD-10-CM | POA: Diagnosis not present

## 2022-08-20 DIAGNOSIS — R079 Chest pain, unspecified: Secondary | ICD-10-CM | POA: Diagnosis present

## 2022-08-20 DIAGNOSIS — I252 Old myocardial infarction: Secondary | ICD-10-CM | POA: Diagnosis not present

## 2022-08-20 DIAGNOSIS — I214 Non-ST elevation (NSTEMI) myocardial infarction: Secondary | ICD-10-CM | POA: Diagnosis not present

## 2022-08-20 DIAGNOSIS — I97618 Postprocedural hemorrhage and hematoma of a circulatory system organ or structure following other circulatory system procedure: Secondary | ICD-10-CM | POA: Diagnosis not present

## 2022-08-20 DIAGNOSIS — Z940 Kidney transplant status: Secondary | ICD-10-CM | POA: Diagnosis not present

## 2022-08-20 DIAGNOSIS — Z8249 Family history of ischemic heart disease and other diseases of the circulatory system: Secondary | ICD-10-CM | POA: Diagnosis not present

## 2022-08-20 DIAGNOSIS — Z87891 Personal history of nicotine dependence: Secondary | ICD-10-CM | POA: Diagnosis not present

## 2022-08-20 DIAGNOSIS — E669 Obesity, unspecified: Secondary | ICD-10-CM | POA: Diagnosis present

## 2022-08-20 DIAGNOSIS — Z6838 Body mass index (BMI) 38.0-38.9, adult: Secondary | ICD-10-CM | POA: Diagnosis not present

## 2022-08-20 DIAGNOSIS — I5022 Chronic systolic (congestive) heart failure: Secondary | ICD-10-CM | POA: Diagnosis present

## 2022-08-20 DIAGNOSIS — I472 Ventricular tachycardia, unspecified: Secondary | ICD-10-CM | POA: Diagnosis not present

## 2022-08-20 DIAGNOSIS — Z79899 Other long term (current) drug therapy: Secondary | ICD-10-CM | POA: Diagnosis not present

## 2022-08-20 DIAGNOSIS — Z7982 Long term (current) use of aspirin: Secondary | ICD-10-CM | POA: Diagnosis not present

## 2022-08-20 LAB — BASIC METABOLIC PANEL
Anion gap: 16 — ABNORMAL HIGH (ref 5–15)
BUN: 61 mg/dL — ABNORMAL HIGH (ref 6–20)
CO2: 22 mmol/L (ref 22–32)
Calcium: 8.8 mg/dL — ABNORMAL LOW (ref 8.9–10.3)
Chloride: 99 mmol/L (ref 98–111)
Creatinine, Ser: 11.08 mg/dL — ABNORMAL HIGH (ref 0.61–1.24)
GFR, Estimated: 5 mL/min — ABNORMAL LOW (ref >60)
Glucose, Bld: 106 mg/dL — ABNORMAL HIGH (ref 70–99)
Potassium: 4 mmol/L (ref 3.5–5.1)
Sodium: 137 mmol/L (ref 135–145)

## 2022-08-20 LAB — TROPONIN I (HIGH SENSITIVITY)
Troponin I (High Sensitivity): 105 ng/L (ref <18)
Troponin I (High Sensitivity): 108 ng/L (ref <18)

## 2022-08-20 LAB — HIV ANTIBODY (ROUTINE TESTING W REFLEX): HIV Screen 4th Generation wRfx: NONREACTIVE

## 2022-08-20 LAB — HEPATITIS B SURFACE ANTIGEN: Hepatitis B Surface Ag: NONREACTIVE

## 2022-08-20 LAB — BRAIN NATRIURETIC PEPTIDE: B Natriuretic Peptide: 62.9 pg/mL (ref 0.0–100.0)

## 2022-08-20 LAB — GLUCOSE, CAPILLARY: Glucose-Capillary: 87 mg/dL (ref 70–99)

## 2022-08-20 MED ORDER — ACETAMINOPHEN 500 MG PO TABS
1000.0000 mg | ORAL_TABLET | Freq: Four times a day (QID) | ORAL | Status: DC | PRN
  Administered 2022-08-21 – 2022-08-23 (×3): 1000 mg via ORAL
  Filled 2022-08-20 (×3): qty 2

## 2022-08-20 MED ORDER — ASPIRIN 81 MG PO TBEC
81.0000 mg | DELAYED_RELEASE_TABLET | Freq: Every day | ORAL | Status: DC
  Administered 2022-08-20 – 2022-08-23 (×4): 81 mg via ORAL
  Filled 2022-08-20 (×4): qty 1

## 2022-08-20 MED ORDER — HYDRALAZINE HCL 20 MG/ML IJ SOLN
10.0000 mg | INTRAMUSCULAR | Status: DC | PRN
  Administered 2022-08-21: 10 mg via INTRAVENOUS
  Filled 2022-08-20: qty 1

## 2022-08-20 MED ORDER — LOSARTAN POTASSIUM 50 MG PO TABS
25.0000 mg | ORAL_TABLET | Freq: Once | ORAL | Status: AC
  Administered 2022-08-20: 25 mg via ORAL
  Filled 2022-08-20: qty 1

## 2022-08-20 MED ORDER — HEPARIN SODIUM (PORCINE) 5000 UNIT/ML IJ SOLN
5000.0000 [IU] | Freq: Three times a day (TID) | INTRAMUSCULAR | Status: DC
  Administered 2022-08-20 – 2022-08-23 (×10): 5000 [IU] via SUBCUTANEOUS
  Filled 2022-08-20 (×10): qty 1

## 2022-08-20 MED ORDER — LOSARTAN POTASSIUM 25 MG PO TABS
25.0000 mg | ORAL_TABLET | Freq: Every day | ORAL | Status: DC
  Administered 2022-08-20 – 2022-08-22 (×2): 25 mg via ORAL
  Filled 2022-08-20 (×2): qty 1

## 2022-08-20 MED ORDER — NITROGLYCERIN 2 % TD OINT
1.0000 [in_us] | TOPICAL_OINTMENT | Freq: Four times a day (QID) | TRANSDERMAL | Status: DC
  Administered 2022-08-20 – 2022-08-21 (×6): 1 [in_us] via TOPICAL
  Filled 2022-08-20 (×6): qty 1

## 2022-08-20 MED ORDER — ONDANSETRON HCL 4 MG/2ML IJ SOLN: 4.0000 mg | Freq: Four times a day (QID) | INTRAMUSCULAR | Status: DC | PRN

## 2022-08-20 MED ORDER — CALCIUM ACETATE (PHOS BINDER) 667 MG PO CAPS
1334.0000 mg | ORAL_CAPSULE | Freq: Two times a day (BID) | ORAL | Status: DC
  Administered 2022-08-20 – 2022-08-21 (×2): 1334 mg via ORAL
  Filled 2022-08-20 (×4): qty 2

## 2022-08-20 MED ORDER — CALCIUM ACETATE (PHOS BINDER) 667 MG PO CAPS
2668.0000 mg | ORAL_CAPSULE | Freq: Three times a day (TID) | ORAL | Status: DC
  Administered 2022-08-20 – 2022-08-23 (×8): 2668 mg via ORAL
  Filled 2022-08-20 (×9): qty 4

## 2022-08-20 MED ORDER — ATORVASTATIN CALCIUM 40 MG PO TABS
40.0000 mg | ORAL_TABLET | Freq: Every day | ORAL | Status: DC
  Administered 2022-08-20 – 2022-08-21 (×2): 40 mg via ORAL
  Filled 2022-08-20 (×2): qty 1

## 2022-08-20 MED ORDER — METOPROLOL SUCCINATE ER 25 MG PO TB24
12.5000 mg | ORAL_TABLET | Freq: Every day | ORAL | Status: DC
  Administered 2022-08-20 – 2022-08-22 (×3): 12.5 mg via ORAL
  Filled 2022-08-20 (×3): qty 1

## 2022-08-20 MED ORDER — RENA-VITE PO TABS
1.0000 | ORAL_TABLET | Freq: Every evening | ORAL | Status: DC
  Administered 2022-08-20 – 2022-08-22 (×2): 1 via ORAL
  Filled 2022-08-20 (×2): qty 1

## 2022-08-20 NOTE — ED Notes (Signed)
Troponin 105 MD aware

## 2022-08-20 NOTE — Consult Note (Signed)
Reason for Consult: ESRD Referring Physician:  Dr. Heath Lark  Chief Complaint: Chest pain  Dialysis orders: [facility] MWF 4h 51min 2/2 bath EDW 114.5kg (left at 113.3 on 2/2) prior to that weights have been much higher 400/500 no UFP Hectorol 30mcg IVP TIW No Heparin, no ESA  Assessment/Plan: 1. Chest pain: + NSTEMI. Trop 786 -> 697 -> 471, Echo with LVEF 45-50% and small WMA. May have demand ischemia from the poorly controlled HTN. Was to have an outpatient LHC. C/O mainly soreness in the left lower aspect of the chest and shoulder this AM.  2. ESRD: On MWF schedule - next tomorrow as Monday, challenging EDW - got to 113.3 kg after HD on Friday and was recently d/c from hospital on Wednesday after HD. Has been changing diet and losing weight. Will plan on HD in the AM. 3. HTN/volume: BP coming down and much better this AM; have to wonder about compliance. BB + ARB. 4. Anemia:  Hgb @ goal, no ESA for now. 5. Secondary hyperparathyroidism: Ca/Phos ok - continue home meds. 6. HFrEF: EF 45-50%  HPI: Jerry [name] is an 52 y.o. male HTN, CASHD, OSA on CPAP, HFrEF (45-50%), ESRD MWF at [facility] with last outpatient treatment on Friday as he was just discharged on Wednesday. He recently was admitted for a NSTEMI and was given the option for a LHC in hospital vs as outpt. Unfortunately he is back with left sided chest pain as well as a run of VT noted in the ED. Chest pain improved after BP meds, nitropaste. BP initially was very high with systolics in the 892'J likely representing a component of demand ischemia.   ROS Pertinent items are noted in HPI.  Chemistry and CBC: Creatinine, Ser  Date/Time Value Ref Range Status  08/19/2022 10:58 PM 11.08 (H) 0.61 - 1.24 mg/dL Final  08/17/2022 04:58 AM 8.32 (H) 0.61 - 1.24 mg/dL Final  08/16/2022 03:01 AM 10.83 (H) 0.61 - 1.24 mg/dL Final  08/13/2022 07:00 AM 7.39 (H) 0.61 - 1.24 mg/dL Final  08/12/2022 05:42 PM 10.97 (H) 0.61 - 1.24 mg/dL Final   11/09/2020 06:12 AM 11.80 (H) 0.61 - 1.24 mg/dL Final  07/29/2020 06:01 AM 9.80 (H) 0.61 - 1.24 mg/dL Final  05/23/2020 09:13 PM 13.83 (H) 0.61 - 1.24 mg/dL Final  05/23/2020 12:47 PM 13.13 (H) 0.61 - 1.24 mg/dL Final  05/23/2020 02:25 AM 11.64 (H) 0.61 - 1.24 mg/dL Final  05/23/2020 12:38 AM 13.30 (H) 0.61 - 1.24 mg/dL Final  02/19/2020 09:00 AM 10.60 (H) 0.61 - 1.24 mg/dL Final  02/04/2020 10:33 AM 11.76 (H) 0.61 - 1.24 mg/dL Final  02/03/2020 04:15 AM 8.04 (H) 0.61 - 1.24 mg/dL Final    Comment:    DELTA CHECK NOTED DIALYSIS   02/02/2020 08:34 AM 13.88 (H) 0.61 - 1.24 mg/dL Final  12/26/2019 09:23 AM 11.76 (H) 0.61 - 1.24 mg/dL Final  12/11/2019 07:28 PM 11.50 (H) 0.61 - 1.24 mg/dL Final  02/04/2018 09:30 AM 14.50 (H) 0.61 - 1.24 mg/dL Final  02/02/2018 05:53 AM 10.95 (H) 0.61 - 1.24 mg/dL Final  02/01/2018 04:40 AM 14.99 (H) 0.61 - 1.24 mg/dL Final  01/31/2018 05:15 AM 13.46 (H) 0.61 - 1.24 mg/dL Final  01/30/2018 01:47 PM 19.35 (H) 0.61 - 1.24 mg/dL Final  01/30/2018 04:23 AM 19.14 (H) 0.61 - 1.24 mg/dL Final  01/30/2018 04:23 AM 19.14 (H) 0.61 - 1.24 mg/dL Final  01/29/2018 05:05 PM 19.32 (H) 0.61 - 1.24 mg/dL Final   Recent Labs  Lab 08/16/22  0301 08/17/22 0458 08/19/22 2258  NA 136 135 137  K 4.2 4.3 4.0  CL 97* 96* 99  CO2 23 26 22   GLUCOSE 105* 98 106*  BUN 63* 38* 61*  CREATININE 10.83* 8.32* 11.08*  CALCIUM 9.5 9.0 8.8*   Recent Labs  Lab 08/16/22 0205 08/16/22 2043 08/17/22 0458 08/19/22 2258  WBC 7.3 8.0 7.3 9.8  HGB 11.7* 13.0 12.8* 11.8*  HCT 37.3* 41.1 39.5 38.1*  MCV 96.9 94.3 94.5 97.9  PLT 302 344 326 299   Liver Function Tests: No results for input(s): "AST", "ALT", "ALKPHOS", "BILITOT", "PROT", "ALBUMIN" in the last 168 hours. No results for input(s): "LIPASE", "AMYLASE" in the last 168 hours. No results for input(s): "AMMONIA" in the last 168 hours. Cardiac Enzymes: No results for input(s): "CKTOTAL", "CKMB", "CKMBINDEX", "TROPONINI" in  the last 168 hours. Iron Studies: No results for input(s): "IRON", "TIBC", "TRANSFERRIN", "FERRITIN" in the last 72 hours. PT/INR: @LABRCNTIP (inr:5)  Xrays/Other Studies: ) Results for orders placed or performed during the hospital encounter of 08/19/22 (from the past 48 hour(s))  Basic metabolic panel     Status: Abnormal   Collection Time: 08/19/22 10:58 PM  Result Value Ref Range   Sodium 137 135 - 145 mmol/L   Potassium 4.0 3.5 - 5.1 mmol/L   Chloride 99 98 - 111 mmol/L   CO2 22 22 - 32 mmol/L   Glucose, Bld 106 (H) 70 - 99 mg/dL    Comment: Glucose reference range applies only to samples taken after fasting for at least 8 hours.   BUN 61 (H) 6 - 20 mg/dL   Creatinine, Ser 11.08 (H) 0.61 - 1.24 mg/dL   Calcium 8.8 (L) 8.9 - 10.3 mg/dL   GFR, Estimated 5 (L) >60 mL/min    Comment: (NOTE) Calculated using the CKD-EPI Creatinine Equation (2021)    Anion gap 16 (H) 5 - 15    Comment: Performed at [facility] [address], [place], [place] [address]  Troponin I (High Sensitivity)     Status: Abnormal   Collection Time: 08/19/22 10:58 PM  Result Value Ref Range   Troponin I (High Sensitivity) 105 (HH) <18 ng/L    Comment: CRITICAL RESULT CALLED TO, READ BACK BY AND VERIFIED WITH Cher Nakai, RN, [phone], 08/20/22, EADEDOKUN (NOTE) Elevated high sensitivity troponin I (hsTnI) values and significant  changes across serial measurements may suggest ACS but many other  chronic and acute conditions are known to elevate hsTnI results.  Refer to the "Links" section for chest pain algorithms and additional  guidance. Performed at [facility], [place] [address], [place], [place] [address]   CBC     Status: Abnormal   Collection Time: 08/19/22 10:58 PM  Result Value Ref Range   WBC 9.8 4.0 - 10.5 K/uL   RBC 3.89 (L) 4.22 - 5.81 MIL/uL   Hemoglobin 11.8 (L) 13.0 - 17.0 g/dL   HCT 38.1 (L) 39.0 - 52.0 %   MCV 97.9 80.0 - 100.0 fL   MCH 30.3 26.0 - 34.0 pg   MCHC 31.0 30.0 -  36.0 g/dL   RDW 14.8 11.5 - 15.5 %   Platelets 299 150 - 400 K/uL   nRBC 0.0 0.0 - 0.2 %    Comment: Performed at [facility], [place] [address] [place], [place] [address]  Brain natriuretic peptide     Status: None   Collection Time: 08/19/22 10:58 PM  Result Value Ref Range  B Natriuretic Peptide 62.9 0.0 - 100.0 pg/mL    Comment: Performed at [facility], [place] [address] [place], [place] [address]  CBG monitoring, ED     Status: Abnormal   Collection Time: 08/19/22 11:03 PM  Result Value Ref Range   Glucose-Capillary 107 (H) 70 - 99 mg/dL    Comment: Glucose reference range applies only to samples taken after fasting for at least 8 hours.  Troponin I (High Sensitivity)     Status: Abnormal   Collection Time: 08/20/22 12:28 AM  Result Value Ref Range   Troponin I (High Sensitivity) 108 (HH) <18 ng/L    Comment: CRITICAL VALUE NOTED. VALUE IS CONSISTENT WITH PREVIOUSLY REPORTED/CALLED VALUE (NOTE) Elevated high sensitivity troponin I (hsTnI) values and significant  changes across serial measurements may suggest ACS but many other  chronic and acute conditions are known to elevate hsTnI results.  Refer to the "Links" section for chest pain algorithms and additional  guidance. Performed at [facility], [place] [address] [place], [place] [address]   Glucose, capillary     Status: None   Collection Time: 08/20/22  7:45 AM  Result Value Ref Range   Glucose-Capillary 87 70 - 99 mg/dL    Comment: Glucose reference range applies only to samples taken after fasting for at least 8 hours.   DG Chest Portable 1 View  Result Date: 08/19/2022 CLINICAL DATA:  Left side chest pain EXAM: PORTABLE CHEST 1 VIEW COMPARISON:  08/16/2022 FINDINGS: Left dialysis catheter remains in place, unchanged. Heart mildly enlarged, stable. Small right pleural effusion and right basilar opacity, similar to prior study. No confluent opacity on the left. No acute bony abnormality. IMPRESSION:  Stable small right pleural effusion with right base atelectasis or infiltrate. Electronically Signed   By: Rolm Baptise M.D.   On: 08/19/2022 23:13    PMH:   Past Medical History:  Diagnosis Date   Complication of anesthesia    "benadryl knocks me out; worse than any normal reactions; anesthesia/numbing RX wear off FAST" (01/30/2018) 2021 Took 3 weeks to able talk and eat soild food due to throat    ESRD (end stage renal disease) on dialysis ([place])    "HAD DIALYSIS BEFORE TRANSPLANT; RESTARTED DIALYSIS 01/30/2018)-  [facility] - [facility]   GERD (gastroesophageal reflux disease)    History of blood transfusion    Hypertension    OSA on CPAP    Pneumonia     PSH:   Past Surgical History:  Procedure Laterality Date   A/V FISTULAGRAM N/A 02/19/2020   Procedure: A/V FISTULAGRAM;  Surgeon: Serafina Mitchell, MD;  Location: [facility];  Service: Cardiovascular;  Laterality: N/A;   AV FISTULA PLACEMENT Right 2014   AV FISTULA PLACEMENT Right 12/26/2019   Procedure: Repair and Revision of Right Forearm AV Fistula;  Surgeon: Rosetta Posner, MD;  Location: [facility];  Service: Vascular;  Laterality: Right;   AV FISTULA PLACEMENT Left 07/29/2020   Procedure: LEFT ARM BRACHIOCEPHALIC ARTERIOVENOUS (AV) FISTULA CREATION;  Surgeon: Waynetta Sandy, MD;  Location: [facility];  Service: Vascular;  Laterality: Left;   [place] Left 11/09/2020   Procedure: LEFT BRACHIOCEPHALIC ARTERIOVENOUS FISTULA TRANSPOSITION;  Surgeon: Waynetta Sandy, MD;  Location: [facility];  Service: Vascular;  Laterality: Left;   INSERTION OF DIALYSIS CATHETER Left 02/02/2020   Procedure: INSERTION OF LEFT INTERNAL JUGULAR 28 CM TUNNELED DIALYSIS CATHETER UNDER ULTRASOUND GUIDANCE;  Surgeon: Marty Heck, MD;  Location:  [facility];  Service: Vascular;  Laterality: Left;   KIDNEY TRANSPLANT  2014   at [place] Left 11/09/2020   Procedure: LIGATION OF  COMPETING BRANCHES OF LEFT BRACHIOCEPHALIC ARTERIOVENOUS FISTULA;  Surgeon: Waynetta Sandy, MD;  Location: [facility];  Service: Vascular;  Laterality: Left;   PARATHYROIDECTOMY  2014   at [place] Right 02/19/2020   Procedure: PERIPHERAL VASCULAR INTERVENTION;  Surgeon: Serafina Mitchell, MD;  Location: [facility];  Service: Cardiovascular;  Laterality: Right;   REVISON OF ARTERIOVENOUS FISTULA Right 05/22/2020   Procedure: REPAIR OF RIGHT ARTERIOVENOUS FISTULA;  Surgeon: Waynetta Sandy, MD;  Location: [facility];  Service: Vascular;  Laterality: Right;    Allergies:  Allergies  Allergen Reactions   Alcohol Rash    Causes skin irritation   Chlorhexidine Itching    Burns skin   Other Other (See Comments)    Local anesthesia (quickly metabolizes)   Tape Other (See Comments)    Adhesive tape (irritates skin)    Medications:   Prior to Admission medications   Medication Sig Start Date End Date Taking? Authorizing Provider  acetaminophen (TYLENOL) 500 MG tablet Take 1,000 mg by mouth every 6 (six) hours as needed for mild pain.   Yes [provider]  aspirin EC 81 MG tablet Take 1 tablet (81 mg total) by mouth daily. Swallow whole. 08/18/22  Yes Patrecia Pour, MD  atorvastatin (LIPITOR) 40 MG tablet Take 1 tablet (40 mg total) by mouth daily. 08/18/22  Yes Patrecia Pour, MD  calcium acetate (PHOSLO) 667 MG capsule Take [phone] mg by mouth with breakfast, with lunch, and with evening meal. Take 4 capsules (2,668 mg) by mouth with each meal & take 2 capsules (1,334 mg) by mouth with snacks 12/01/19  Yes [provider]  losartan (COZAAR) 25 MG tablet Take 25 mg by mouth at bedtime. 08/06/19  Yes [provider]  metoprolol succinate (TOPROL XL) 25 MG 24 hr tablet Take 0.5 tablets (12.5 mg total) by mouth daily. 08/17/22  Yes Patrecia Pour, MD  multivitamin (RENA-VIT) TABS tablet Take 1 tablet by mouth every evening.   Yes  [provider]    Discontinued Meds:  There are no discontinued medications.  Social History:  reports that he has quit smoking. His smoking use included cigars. He has never used smokeless tobacco. He reports current alcohol use of about 1.0 standard drink of alcohol per week. He reports that he does not currently use drugs.  Family History:   Family History  Problem Relation Age of Onset   Heart failure Mother    Kidney failure Father     Blood pressure 122/75, pulse 85, temperature 97.6 F (36.4 C), temperature source Oral, resp. rate 16, height 5\' 8"  (1.727 m), weight 114 kg, SpO2 99 %. General: Well appearing man, NAD. Nasal O2 in place Heart: RRR; no murmur Lungs: CTAB; no rales Abdomen: soft, non-tender Extremities: No LE edema Dialysis Access:  L TDC       Emran Molzahn, Hunt Oris, MD 08/20/2022, 7:53 AM

## 2022-08-20 NOTE — ED Notes (Signed)
ED TO INPATIENT HANDOFF REPORT  ED Nurse Name and Phone #: Colletta [place] [phone]  S Name/Age/Gender Jerry [name] 52 y.o. male Room/Bed: 022C/022C  Code Status   Code Status: Full Code  Home/SNF/Other Home Patient oriented to: self, place, time, and situation Is this baseline? Yes   Triage Complete: Triage complete  Chief Complaint Chest pain [R07.9]  Triage Note Pt BIBA to ED for left sided CP. Pt was here yesterday for same and discharged. Pt reporting CP worsens when he talks. Reports SOB and light headedness. Reports pain is better with hand above head.   Allergies Allergies  Allergen Reactions   Chlorhexidine Itching    Burns skin   Other Other (See Comments)    Local anesthesia (quickly metabolizes)   Tape Other (See Comments)    Adhesive tape (irritates skin)    Level of Care/Admitting Diagnosis ED Disposition     ED Disposition  Admit   Condition  --   Comment  Hospital Area: [facility] [[id number]]  Level of Care: Telemetry Cardiac [[id number]]  May place patient in observation at [facility] or [facility] if equivalent level of care is available:: No  Covid Evaluation: Asymptomatic - no recent exposure (last 10 days) testing not required  Diagnosis: Chest pain [176160]  Admitting Physician: Etta Quill [[id number]]  Attending Physician: Etta Quill [[id number]]          B Medical/Surgery History Past Medical History:  Diagnosis Date   Complication of anesthesia    "benadryl knocks me out; worse than any normal reactions; anesthesia/numbing RX wear off FAST" (01/30/2018) 2021 Took 3 weeks to able talk and eat soild food due to throat    ESRD (end stage renal disease) on dialysis ([place])    "HAD DIALYSIS BEFORE TRANSPLANT; RESTARTED DIALYSIS 01/30/2018)-  [facility] - [facility]   GERD (gastroesophageal reflux disease)    History of blood transfusion    Hypertension    OSA on CPAP    Pneumonia    Past Surgical History:  Procedure Laterality  Date   A/V FISTULAGRAM N/A 02/19/2020   Procedure: A/V FISTULAGRAM;  Surgeon: Serafina Mitchell, MD;  Location: [facility];  Service: Cardiovascular;  Laterality: N/A;   AV FISTULA PLACEMENT Right 2014   AV FISTULA PLACEMENT Right 12/26/2019   Procedure: Repair and Revision of Right Forearm AV Fistula;  Surgeon: Rosetta Posner, MD;  Location: [facility];  Service: Vascular;  Laterality: Right;   AV FISTULA PLACEMENT Left 07/29/2020   Procedure: LEFT ARM BRACHIOCEPHALIC ARTERIOVENOUS (AV) FISTULA CREATION;  Surgeon: Waynetta Sandy, MD;  Location: [facility];  Service: Vascular;  Laterality: Left;   [place] Left 11/09/2020   Procedure: LEFT BRACHIOCEPHALIC ARTERIOVENOUS FISTULA TRANSPOSITION;  Surgeon: Waynetta Sandy, MD;  Location: [facility];  Service: Vascular;  Laterality: Left;   INSERTION OF DIALYSIS CATHETER Left 02/02/2020   Procedure: INSERTION OF LEFT INTERNAL JUGULAR 28 CM TUNNELED DIALYSIS CATHETER UNDER ULTRASOUND GUIDANCE;  Surgeon: Marty Heck, MD;  Location: [facility];  Service: Vascular;  Laterality: Left;   KIDNEY TRANSPLANT  2014   at [place] Left 11/09/2020   Procedure: LIGATION OF COMPETING BRANCHES OF LEFT BRACHIOCEPHALIC ARTERIOVENOUS FISTULA;  Surgeon: Waynetta Sandy, MD;  Location: [facility];  Service: Vascular;  Laterality: Left;   PARATHYROIDECTOMY  2014   at [place] Right 02/19/2020   Procedure: PERIPHERAL VASCULAR INTERVENTION;  Surgeon:  Serafina Mitchell, MD;  Location: [facility];  Service: Cardiovascular;  Laterality: Right;   REVISON OF ARTERIOVENOUS FISTULA Right 05/22/2020   Procedure: REPAIR OF RIGHT ARTERIOVENOUS FISTULA;  Surgeon: Waynetta Sandy, MD;  Location: [facility];  Service: Vascular;  Laterality: Right;     A IV Location/Drains/Wounds Patient Lines/Drains/Airways Status     Active Line/Drains/Airways     Name  Placement date Placement time Site Days   Peripheral IV 08/19/22 20 G Right Hand 08/19/22  2258  Hand  1   Fistula / Graft Right Forearm Arteriovenous fistula --   --  Forearm  --   Fistula / Graft Right Forearm 02/03/20  1935  Forearm  929   Fistula / Graft Left Upper arm Arteriovenous fistula 07/29/20  0832  Upper arm  752   Hemodialysis Catheter Left Internal jugular Double lumen Permanent (Tunneled) 02/02/20  1658  Internal jugular  930            Intake/Output Last 24 hours No intake or output data in the 24 hours ending 08/20/22 0251  Labs/Imaging Results for orders placed or performed during the hospital encounter of 08/19/22 (from the past 48 hour(s))  Basic metabolic panel     Status: Abnormal   Collection Time: 08/19/22 10:58 PM  Result Value Ref Range   Sodium 137 135 - 145 mmol/L   Potassium 4.0 3.5 - 5.1 mmol/L   Chloride 99 98 - 111 mmol/L   CO2 22 22 - 32 mmol/L   Glucose, Bld 106 (H) 70 - 99 mg/dL    Comment: Glucose reference range applies only to samples taken after fasting for at least 8 hours.   BUN 61 (H) 6 - 20 mg/dL   Creatinine, Ser 11.08 (H) 0.61 - 1.24 mg/dL   Calcium 8.8 (L) 8.9 - 10.3 mg/dL   GFR, Estimated 5 (L) >60 mL/min    Comment: (NOTE) Calculated using the CKD-EPI Creatinine Equation (2021)    Anion gap 16 (H) 5 - 15    Comment: Performed at [facility] [address] [place], [place] [address]  Troponin I (High Sensitivity)     Status: Abnormal   Collection Time: 08/19/22 10:58 PM  Result Value Ref Range   Troponin I (High Sensitivity) 105 (HH) <18 ng/L    Comment: CRITICAL RESULT CALLED TO, READ BACK BY AND VERIFIED WITH Cher Nakai, RN, [phone], 08/20/22, EADEDOKUN (NOTE) Elevated high sensitivity troponin I (hsTnI) values and significant  changes across serial measurements may suggest ACS but many other  chronic and acute conditions are known to elevate hsTnI results.  Refer to the "Links" section for chest pain algorithms and  additional  guidance. Performed at [facility], [place] [address] [place], [place] [address]   CBC     Status: Abnormal   Collection Time: 08/19/22 10:58 PM  Result Value Ref Range   WBC 9.8 4.0 - 10.5 K/uL   RBC 3.89 (L) 4.22 - 5.81 MIL/uL   Hemoglobin 11.8 (L) 13.0 - 17.0 g/dL   HCT 38.1 (L) 39.0 - 52.0 %   MCV 97.9 80.0 - 100.0 fL   MCH 30.3 26.0 - 34.0 pg   MCHC 31.0 30.0 - 36.0 g/dL   RDW 14.8 11.5 - 15.5 %   Platelets 299 150 - 400 K/uL   nRBC 0.0 0.0 - 0.2 %    Comment: Performed at [facility], [place] [address], [place], [place] [address]  Brain  natriuretic peptide     Status: None   Collection Time: 08/19/22 10:58 PM  Result Value Ref Range   B Natriuretic Peptide 62.9 0.0 - 100.0 pg/mL    Comment: Performed at [facility], [place] [address] [place], [place] [address]  CBG monitoring, ED     Status: Abnormal   Collection Time: 08/19/22 11:03 PM  Result Value Ref Range   Glucose-Capillary 107 (H) 70 - 99 mg/dL    Comment: Glucose reference range applies only to samples taken after fasting for at least 8 hours.  Troponin I (High Sensitivity)     Status: Abnormal   Collection Time: 08/20/22 12:28 AM  Result Value Ref Range   Troponin I (High Sensitivity) 108 (HH) <18 ng/L    Comment: CRITICAL VALUE NOTED. VALUE IS CONSISTENT WITH PREVIOUSLY REPORTED/CALLED VALUE (NOTE) Elevated high sensitivity troponin I (hsTnI) values and significant  changes across serial measurements may suggest ACS but many other  chronic and acute conditions are known to elevate hsTnI results.  Refer to the "Links" section for chest pain algorithms and additional  guidance. Performed at [facility], [place] [address], [place],  [address]    DG Chest Portable 1 View  Result Date: 08/19/2022 CLINICAL DATA:  Left side chest pain EXAM: PORTABLE CHEST 1 VIEW COMPARISON:  08/16/2022 FINDINGS: Left dialysis catheter remains in place, unchanged. Heart mildly enlarged, stable.  Small right pleural effusion and right basilar opacity, similar to prior study. No confluent opacity on the left. No acute bony abnormality. IMPRESSION: Stable small right pleural effusion with right base atelectasis or infiltrate. Electronically Signed   By: Rolm Baptise M.D.   On: 08/19/2022 23:13    Pending Labs Unresulted Labs (From admission, onward)     Start     Ordered   08/20/22 0243  HIV Antibody (routine testing w rflx)  (HIV Antibody (Routine testing w reflex) panel)  Once,   R        08/20/22 0244            Vitals/Pain Today's Vitals   08/20/22 0130 08/20/22 0155 08/20/22 0200 08/20/22 0230  BP: (!) 146/117  (!) 151/108 (!) 156/95  Pulse: (!) 108  95 96  Resp: (!) 24  15 19   Temp:  98.8 F (37.1 C)    TempSrc:  Oral    SpO2: 98%  97% 98%  Weight:      Height:      PainSc:        Isolation Precautions No active isolations  Medications Medications  aspirin EC tablet 81 mg (has no administration in time range)  atorvastatin (LIPITOR) tablet 40 mg (has no administration in time range)  metoprolol succinate (TOPROL-XL) 24 hr tablet 12.5 mg (has no administration in time range)  losartan (COZAAR) tablet 25 mg (has no administration in time range)  calcium acetate (PHOSLO) capsule [phone] mg (has no administration in time range)  multivitamin (RENA-VIT) tablet 1 tablet (has no administration in time range)  acetaminophen (TYLENOL) tablet 1,000 mg (has no administration in time range)  hydrALAZINE (APRESOLINE) injection 10 mg (has no administration in time range)  ondansetron (ZOFRAN) injection 4 mg (has no administration in time range)  heparin injection 5,000 Units (has no administration in time range)  nitroGLYCERIN (NITROGLYN) 2 % ointment 1 inch (has no administration in time range)  nitroGLYCERIN (NITROGLYN) 2 % ointment 1 inch (1 inch Topical Given 08/20/22 0019)  losartan (COZAAR) tablet 25 mg (25 mg  Oral Given 08/20/22 0153)    Mobility walks      Focused Assessments Cardiac Assessment Handoff:    Lab Results  Component Value Date   CKTOTAL 268 01/29/2018   TROPONINI 0.08 ([place]) 01/30/2018   No results found for: "DDIMER" Does the Patient currently have chest pain? No    R Recommendations: See Admitting Provider Note  Report given to:   Additional Notes:

## 2022-08-20 NOTE — Assessment & Plan Note (Addendum)
Hypertensive urgency vs CAD. Recent admit for NSTEMI, LHC work up planned as outpt. Symptoms today are resolved following BP control and NTG paste. CP obs pathway Trops flat BP control: Resume home losartan Cont metoprolol Add PRN hydralazine Continue NTG paste Call Dr. Terrence Dupont in AM Given flat trops, will hold off on heparin drip for the moment. Tele monitor

## 2022-08-20 NOTE — Progress Notes (Signed)
PROGRESS NOTE    Jerry [name]  PXT:[id number] DOB: 10-06-1970 DOA: 08/19/2022 PCP: Corliss Parish, MD   Brief Narrative:    Jerry [name] is a 52 y.o. male with medical history significant of ESRD MWF, OSA, HTN.   Pt with recent admit 1/31-2/1 for NSTEMI.  Preferred outpt work up for [facility] so discharged home.  He now returns with complaints of left-sided chest pain as well as elevated blood pressure readings.  Blood pressures continue to remain somewhat elevated and cardiology consultation ordered and pending.  Assessment & Plan:   Principal Problem:   Chest pain Active Problems:   End stage renal disease (HCC)   Hypertensive urgency  Assessment and Plan:  Chest pain-improved Hypertensive urgency vs CAD. Recent admit for NSTEMI, [facility] work up planned as outpt. Symptoms today are resolved following BP control and NTG paste. CP obs pathway Trops flat BP control: Resume home losartan Cont metoprolol Add PRN hydralazine Continue NTG paste Flat troponin trend noted and therefore heparin drip held for now [facility] consultation, discussed with Dr. Doylene Canard who will come to assess   End stage renal disease [facility]) Discussed with Dr. Augustin Coupe who will evaluate for dialysis starting Monday  Obesity BMI 38.21    DVT prophylaxis:Heparin Code Status: Full Family Communication: None at bedside Disposition Plan:  Status is: Observation The patient will require care spanning > 2 midnights and should be moved to inpatient because: Need for IV medications.  Consultants:  Nephrology Cardiology Dr. Terrence Dupont  Procedures:  None  Antimicrobials:  None   Subjective: Patient seen and evaluated today with no new acute complaints or concerns. No acute concerns or events noted overnight.  Objective: Vitals:   08/20/22 0300 08/20/22 0408 08/20/22 0606 08/20/22 0748  BP: (!) 165/153 (!) 128/96 122/75   Pulse: 90 85    Resp: 20 16    Temp:  97.6 F (36.4 C)  97.6  F (36.4 C)  TempSrc:  Oral  Oral  SpO2: 96% 99%    Weight:  114 kg    Height:  5\' 8"  (1.727 m)      Intake/Output Summary (Last 24 hours) at 08/20/2022 1042 Last data filed at 08/20/2022 0523 Gross per 24 hour  Intake 240 ml  Output --  Net 240 ml   Filed Weights   08/19/22 2149 08/20/22 0408  Weight: 113 kg 114 kg    Examination:  General exam: Appears calm and comfortable  Respiratory system: Clear to auscultation. Respiratory effort normal.  Currently on nasal cannula oxygen Cardiovascular system: S1 & S2 heard, RRR.  Gastrointestinal system: Abdomen is soft Central nervous system: Alert and awake Extremities: No edema Skin: No significant lesions noted Psychiatry: Flat affect.    Data Reviewed: I have personally reviewed following labs and imaging studies  CBC: Recent Labs  Lab 08/16/22 0205 08/16/22 2043 08/17/22 0458 08/19/22 2258  WBC 7.3 8.0 7.3 9.8  HGB 11.7* 13.0 12.8* 11.8*  HCT 37.3* 41.1 39.5 38.1*  MCV 96.9 94.3 94.5 97.9  PLT 302 344 326 627   Basic Metabolic Panel: Recent Labs  Lab 08/16/22 0205 08/16/22 0301 08/17/22 0458 08/19/22 2258  NA  --  136 135 137  K  --  4.2 4.3 4.0  CL  --  97* 96* 99  CO2  --  23 26 22   GLUCOSE  --  105* 98 106*  BUN  --  63* 38* 61*  CREATININE  --  10.83* 8.32* 11.08*  CALCIUM  --  9.5  9.0 8.8*  MG 2.6*  --   --   --    GFR: Estimated Creatinine Clearance: 9.7 mL/min (A) (by C-G formula based on SCr of 11.08 mg/dL (H)). Liver Function Tests: No results for input(s): "AST", "ALT", "ALKPHOS", "BILITOT", "PROT", "ALBUMIN" in the last 168 hours. No results for input(s): "LIPASE", "AMYLASE" in the last 168 hours. No results for input(s): "AMMONIA" in the last 168 hours. Coagulation Profile: No results for input(s): "INR", "PROTIME" in the last 168 hours. Cardiac Enzymes: No results for input(s): "CKTOTAL", "CKMB", "CKMBINDEX", "TROPONINI" in the last 168 hours. BNP (last 3 results) No results for  input(s): "PROBNP" in the last 8760 hours. HbA1C: No results for input(s): "HGBA1C" in the last 72 hours. CBG: Recent Labs  Lab 08/19/22 2303 08/20/22 0745  GLUCAP 107* 87   Lipid Profile: No results for input(s): "CHOL", "HDL", "LDLCALC", "TRIG", "CHOLHDL", "LDLDIRECT" in the last 72 hours. Thyroid Function Tests: No results for input(s): "TSH", "T4TOTAL", "FREET4", "T3FREE", "THYROIDAB" in the last 72 hours. Anemia Panel: No results for input(s): "VITAMINB12", "FOLATE", "FERRITIN", "TIBC", "IRON", "RETICCTPCT" in the last 72 hours. Sepsis Labs: No results for input(s): "PROCALCITON", "LATICACIDVEN" in the last 168 hours.  No results found for this or any previous visit (from the past 240 hour(s)).       Radiology Studies: DG Chest Portable 1 View  Result Date: 08/19/2022 CLINICAL DATA:  Left side chest pain EXAM: PORTABLE CHEST 1 VIEW COMPARISON:  08/16/2022 FINDINGS: Left dialysis catheter remains in place, unchanged. Heart mildly enlarged, stable. Small right pleural effusion and right basilar opacity, similar to prior study. No confluent opacity on the left. No acute bony abnormality. IMPRESSION: Stable small right pleural effusion with right base atelectasis or infiltrate. Electronically Signed   By: Rolm Baptise M.D.   On: 08/19/2022 23:13        Scheduled Meds:  aspirin EC  81 mg Oral Daily   atorvastatin  40 mg Oral Daily   calcium acetate  1,334 mg Oral BID BM   calcium acetate  2,668 mg Oral TID with meals   heparin  5,000 Units Subcutaneous Q8H   losartan  25 mg Oral QHS   metoprolol succinate  12.5 mg Oral Daily   multivitamin  1 tablet Oral QPM   nitroGLYCERIN  1 inch Topical Q6H     LOS: 0 days    Time spent: 35 minutes    Saranne Crislip Darleen Crocker, DO [facility]  If 7PM-7AM, please contact night-coverage [email or web address] 08/20/2022, 10:42 AM

## 2022-08-20 NOTE — Consult Note (Signed)
Referring Physician: Heath Lark, DO/M. Terrence Dupont, MD  Jerry [name] is an 52 y.o. male.                       Chief Complaint: Chest pain  HPI: 52 years old black male with PMH of HTN, ESRD with MWF hemodialysis, OSA on CPAP, recent NSTEMI has recurrent chest pain. He responds to NTG paste and BP medications intake.  His Troponin I levels are minimally elevated compared to earlier admission on 08/16/2022. His BNP has normalized from elevated level 5 days back.  CXR shows stable small right pleural effusion with right base atelectasis. EKG shows sinus rhythm, poor r wave progression and high lateral T wave changes. Echocardiogram on last admission 5 days back showed mild LV systolic dysfunction and moderate LVH and anterior wall hypokinesia.  Past Medical History:  Diagnosis Date   Complication of anesthesia    "benadryl knocks me out; worse than any normal reactions; anesthesia/numbing RX wear off FAST" (01/30/2018) 2021 Took 3 weeks to able talk and eat soild food due to throat    ESRD (end stage renal disease) on dialysis ([place])    "HAD DIALYSIS BEFORE TRANSPLANT; RESTARTED DIALYSIS 01/30/2018)-  [facility] - MWF   GERD (gastroesophageal reflux disease)    History of blood transfusion    Hypertension    OSA on CPAP    Pneumonia       Past Surgical History:  Procedure Laterality Date   A/V FISTULAGRAM N/A 02/19/2020   Procedure: A/V FISTULAGRAM;  Surgeon: Serafina Mitchell, MD;  Location: [place] [facility];  Service: Cardiovascular;  Laterality: N/A;   AV FISTULA PLACEMENT Right 2014   AV FISTULA PLACEMENT Right 12/26/2019   Procedure: Repair and Revision of Right Forearm AV Fistula;  Surgeon: Rosetta Posner, MD;  Location: [place];  Service: Vascular;  Laterality: Right;   AV FISTULA PLACEMENT Left 07/29/2020   Procedure: LEFT ARM BRACHIOCEPHALIC ARTERIOVENOUS (AV) FISTULA CREATION;  Surgeon: Waynetta Sandy, MD;  Location: [place];  Service: Vascular;  Laterality: Left;   [place] Left 11/09/2020   Procedure: LEFT BRACHIOCEPHALIC ARTERIOVENOUS FISTULA TRANSPOSITION;  Surgeon: Waynetta Sandy, MD;  Location: [place];  Service: Vascular;  Laterality: Left;   INSERTION OF DIALYSIS CATHETER Left 02/02/2020   Procedure: INSERTION OF LEFT INTERNAL JUGULAR 28 CM TUNNELED DIALYSIS CATHETER UNDER ULTRASOUND GUIDANCE;  Surgeon: Marty Heck, MD;  Location: [place];  Service: Vascular;  Laterality: Left;   KIDNEY TRANSPLANT  2014   at [place] Left 11/09/2020   Procedure: LIGATION OF COMPETING BRANCHES OF LEFT BRACHIOCEPHALIC ARTERIOVENOUS FISTULA;  Surgeon: Waynetta Sandy, MD;  Location: [place];  Service: Vascular;  Laterality: Left;   PARATHYROIDECTOMY  2014   at [place] Right 02/19/2020   Procedure: PERIPHERAL VASCULAR INTERVENTION;  Surgeon: Serafina Mitchell, MD;  Location: [place] [facility];  Service: Cardiovascular;  Laterality: Right;   REVISON OF ARTERIOVENOUS FISTULA Right 05/22/2020   Procedure: REPAIR OF RIGHT ARTERIOVENOUS FISTULA;  Surgeon: Waynetta Sandy, MD;  Location: [place];  Service: Vascular;  Laterality: Right;    Family History  Problem Relation Age of Onset   Heart failure Mother    Kidney failure Father    Social History:  reports that he has quit smoking. His smoking use included cigars. He has never used smokeless tobacco. He reports current alcohol use of  about 1.0 standard drink of alcohol per week. He reports that he does not currently use drugs.  Allergies:  Allergies  Allergen Reactions   Alcohol Rash    Causes skin irritation   Chlorhexidine Itching    Burns skin   Other Other (See Comments)    Local anesthesia (quickly metabolizes)   Tape Other (See Comments)    Adhesive tape (irritates skin)    Facility-Administered Medications Prior to Admission  Medication Dose Route Frequency Provider Last Rate Last  Admin   0.9 %  sodium chloride infusion  250 mL Intravenous PRN Serafina Mitchell, MD       Medications Prior to Admission  Medication Sig Dispense Refill   acetaminophen (TYLENOL) 500 MG tablet Take 1,000 mg by mouth every 6 (six) hours as needed for mild pain.     aspirin EC 81 MG tablet Take 1 tablet (81 mg total) by mouth daily. Swallow whole. 30 tablet 0   atorvastatin (LIPITOR) 40 MG tablet Take 1 tablet (40 mg total) by mouth daily. 30 tablet 0   calcium acetate (PHOSLO) 667 MG capsule Take 1,334-2,668 mg by mouth with breakfast, with lunch, and with evening meal. Take 4 capsules (2,668 mg) by mouth with each meal & take 2 capsules (1,334 mg) by mouth with snacks     losartan (COZAAR) 25 MG tablet Take 25 mg by mouth at bedtime.     metoprolol succinate (TOPROL XL) 25 MG 24 hr tablet Take 0.5 tablets (12.5 mg total) by mouth daily. 15 tablet 0   multivitamin (RENA-VIT) TABS tablet Take 1 tablet by mouth every evening.      Results for orders placed or performed during the hospital encounter of 08/19/22 (from the past 48 hour(s))  Basic metabolic panel     Status: Abnormal   Collection Time: 08/19/22 10:58 PM  Result Value Ref Range   Sodium 137 135 - 145 mmol/L   Potassium 4.0 3.5 - 5.1 mmol/L   Chloride 99 98 - 111 mmol/L   CO2 22 22 - 32 mmol/L   Glucose, Bld 106 (H) 70 - 99 mg/dL    Comment: Glucose reference range applies only to samples taken after fasting for at least 8 hours.   BUN 61 (H) 6 - 20 mg/dL   Creatinine, Ser 11.08 (H) 0.61 - 1.24 mg/dL   Calcium 8.8 (L) 8.9 - 10.3 mg/dL   GFR, Estimated 5 (L) >60 mL/min    Comment: (NOTE) Calculated using the CKD-EPI Creatinine Equation (2021)    Anion gap 16 (H) 5 - 15    Comment: Performed at [facility] [address], [place], [place] [address]  Troponin I (High Sensitivity)     Status: Abnormal   Collection Time: 08/19/22 10:58 PM  Result Value Ref Range   Troponin I (High Sensitivity) 105 (HH) <18 ng/L     Comment: CRITICAL RESULT CALLED TO, READ BACK BY AND VERIFIED WITH Cher Nakai, RN, [phone], 08/20/22, [place] (NOTE) Elevated high sensitivity troponin I (hsTnI) values and significant  changes across serial measurements may suggest ACS but many other  chronic and acute conditions are known to elevate hsTnI results.  Refer to the "Links" section for chest pain algorithms and additional  guidance. Performed at [facility], [place] [address] [place], [place] [address]   CBC     Status: Abnormal   Collection Time: 08/19/22 10:58 PM  Result Value Ref Range   WBC 9.8 4.0 - 10.5 K/uL  RBC 3.89 (L) 4.22 - 5.81 MIL/uL   Hemoglobin 11.8 (L) 13.0 - 17.0 g/dL   HCT 38.1 (L) 39.0 - 52.0 %   MCV 97.9 80.0 - 100.0 fL   MCH 30.3 26.0 - 34.0 pg   MCHC 31.0 30.0 - 36.0 g/dL   RDW 14.8 11.5 - 15.5 %   Platelets 299 150 - 400 K/uL   nRBC 0.0 0.0 - 0.2 %    Comment: Performed at [facility] [address], [place], [place] [address]  Brain natriuretic peptide     Status: None   Collection Time: 08/19/22 10:58 PM  Result Value Ref Range   B Natriuretic Peptide 62.9 0.0 - 100.0 pg/mL    Comment: Performed at [facility] [address] [place], [place] [address]  CBG monitoring, ED     Status: Abnormal   Collection Time: 08/19/22 11:03 PM  Result Value Ref Range   Glucose-Capillary 107 (H) 70 - 99 mg/dL    Comment: Glucose reference range applies only to samples taken after fasting for at least 8 hours.  Troponin I (High Sensitivity)     Status: Abnormal   Collection Time: 08/20/22 12:28 AM  Result Value Ref Range   Troponin I (High Sensitivity) 108 (HH) <18 ng/L    Comment: CRITICAL VALUE NOTED. VALUE IS CONSISTENT WITH PREVIOUSLY REPORTED/CALLED VALUE (NOTE) Elevated high sensitivity troponin I (hsTnI) values and significant  changes across serial measurements may suggest ACS but many other  chronic and acute conditions are known to elevate hsTnI results.  Refer to the  "Links" section for chest pain algorithms and additional  guidance. Performed at [facility], [place] [address] [place], [place] [address]   Glucose, capillary     Status: None   Collection Time: 08/20/22  7:45 AM  Result Value Ref Range   Glucose-Capillary 87 70 - 99 mg/dL    Comment: Glucose reference range applies only to samples taken after fasting for at least 8 hours.   DG Chest Portable 1 View  Result Date: 08/19/2022 CLINICAL DATA:  Left side chest pain EXAM: PORTABLE CHEST 1 VIEW COMPARISON:  08/16/2022 FINDINGS: Left dialysis catheter remains in place, unchanged. Heart mildly enlarged, stable. Small right pleural effusion and right basilar opacity, similar to prior study. No confluent opacity on the left. No acute bony abnormality. IMPRESSION: Stable small right pleural effusion with right base atelectasis or infiltrate. Electronically Signed   By: Rolm Baptise M.D.   On: 08/19/2022 23:13    Review Of Systems Constitutional: No fever, chills, Chronic weight gain. Eyes: No vision change, wears glasses. No discharge or pain. Ears: No hearing loss, No tinnitus. Respiratory: No asthma, COPD, pneumonias. Positive shortness of breath. No hemoptysis. Cardiovascular: Positive chest pain, no palpitation, leg edema. Gastrointestinal: No nausea, vomiting, diarrhea, constipation. No GI bleed. No hepatitis. Genitourinary: No dysuria, hematuria, kidney stone. No incontinance. Positive ESRD. Neurological: No headache, stroke, seizures.  Psychiatry: No psych facility admission for anxiety, depression, suicide. No detox. Skin: No rash. Musculoskeletal: No joint pain, fibromyalgia. No neck pain, back pain. Lymphadenopathy: No lymphadenopathy. Hematology: Mild anemia. No easy bruising.   Blood pressure 122/75, pulse 85, temperature 97.6 F (36.4 C), temperature source Oral, resp. rate 16, height 5\' 8"  (1.727 m), weight 114 kg, SpO2 99 %. Body mass index is 38.21 kg/m. General appearance:  alert, cooperative, appears stated age and no distress Head: Normocephalic, atraumatic. Eyes: Brown eyes, pink conjunctiva, corneas clear.  Neck: No adenopathy,  no carotid bruit, no JVD, supple, symmetrical, trachea midline and thyroid not enlarged. Resp: Clear to auscultation bilaterally. Cardio: Regular rate and rhythm, S1, S2 normal, II/VI systolic murmur, no click, rub or gallop GI: Soft, non-tender; bowel sounds normal; no organomegaly. Extremities: No edema, cyanosis or clubbing. Left sided TDC. Skin: Warm and dry.  Neurologic: Alert and oriented X 3, normal strength. Normal coordination.  Assessment/Plan Acute coronary syndrome Recent NSTEMI HTN ESRD with M-W-F hemodialysis OSA Anemia of chronic disease  Plan: Offered cardiac catheterization. Patient understood risks and procedure and wants me to proceed. NPO post mid-night.  Time spent: Review of old records, Lab, x-rays, EKG, other cardiac tests, examination, discussion with patient/Family/Doctor over 70 minutes.  Birdie Riddle, MD  08/20/2022, 10:25 AM

## 2022-08-20 NOTE — H&P (View-Only) (Signed)
Referring Physician: Heath Lark, DO/M. Terrence Dupont, MD  Jerry [name] is an 52 y.o. male.                       Chief Complaint: Chest pain  HPI: 52 years old black male with PMH of HTN, ESRD with MWF hemodialysis, OSA on CPAP, recent NSTEMI has recurrent chest pain. He responds to NTG paste and BP medications intake.  His Troponin I levels are minimally elevated compared to earlier admission on 08/16/2022. His BNP has normalized from elevated level 5 days back.  CXR shows stable small right pleural effusion with right base atelectasis. EKG shows sinus rhythm, poor r wave progression and high lateral T wave changes. Echocardiogram on last admission 5 days back showed mild LV systolic dysfunction and moderate LVH and anterior wall hypokinesia.  Past Medical History:  Diagnosis Date   Complication of anesthesia    "benadryl knocks me out; worse than any normal reactions; anesthesia/numbing RX wear off FAST" (01/30/2018) 2021 Took 3 weeks to able talk and eat soild food due to throat    ESRD (end stage renal disease) on dialysis ([place])    "HAD DIALYSIS BEFORE TRANSPLANT; RESTARTED DIALYSIS 01/30/2018)-  [facility] - MWF   GERD (gastroesophageal reflux disease)    History of blood transfusion    Hypertension    OSA on CPAP    Pneumonia       Past Surgical History:  Procedure Laterality Date   A/V FISTULAGRAM N/A 02/19/2020   Procedure: A/V FISTULAGRAM;  Surgeon: Serafina Mitchell, MD;  Location: [place] [facility];  Service: Cardiovascular;  Laterality: N/A;   AV FISTULA PLACEMENT Right 2014   AV FISTULA PLACEMENT Right 12/26/2019   Procedure: Repair and Revision of Right Forearm AV Fistula;  Surgeon: Rosetta Posner, MD;  Location: [place];  Service: Vascular;  Laterality: Right;   AV FISTULA PLACEMENT Left 07/29/2020   Procedure: LEFT ARM BRACHIOCEPHALIC ARTERIOVENOUS (AV) FISTULA CREATION;  Surgeon: Waynetta Sandy, MD;  Location: [place];  Service: Vascular;  Laterality: Left;   [place] Left 11/09/2020   Procedure: LEFT BRACHIOCEPHALIC ARTERIOVENOUS FISTULA TRANSPOSITION;  Surgeon: Waynetta Sandy, MD;  Location: [place];  Service: Vascular;  Laterality: Left;   INSERTION OF DIALYSIS CATHETER Left 02/02/2020   Procedure: INSERTION OF LEFT INTERNAL JUGULAR 28 CM TUNNELED DIALYSIS CATHETER UNDER ULTRASOUND GUIDANCE;  Surgeon: Marty Heck, MD;  Location: [place];  Service: Vascular;  Laterality: Left;   KIDNEY TRANSPLANT  2014   at [place] Left 11/09/2020   Procedure: LIGATION OF COMPETING BRANCHES OF LEFT BRACHIOCEPHALIC ARTERIOVENOUS FISTULA;  Surgeon: Waynetta Sandy, MD;  Location: [place];  Service: Vascular;  Laterality: Left;   PARATHYROIDECTOMY  2014   at [place] Right 02/19/2020   Procedure: PERIPHERAL VASCULAR INTERVENTION;  Surgeon: Serafina Mitchell, MD;  Location: [place] [facility];  Service: Cardiovascular;  Laterality: Right;   REVISON OF ARTERIOVENOUS FISTULA Right 05/22/2020   Procedure: REPAIR OF RIGHT ARTERIOVENOUS FISTULA;  Surgeon: Waynetta Sandy, MD;  Location: [place];  Service: Vascular;  Laterality: Right;    Family History  Problem Relation Age of Onset   Heart failure Mother    Kidney failure Father    Social History:  reports that he has quit smoking. His smoking use included cigars. He has never used smokeless tobacco. He reports current alcohol use of  about 1.0 standard drink of alcohol per week. He reports that he does not currently use drugs.  Allergies:  Allergies  Allergen Reactions   Alcohol Rash    Causes skin irritation   Chlorhexidine Itching    Burns skin   Other Other (See Comments)    Local anesthesia (quickly metabolizes)   Tape Other (See Comments)    Adhesive tape (irritates skin)    Facility-Administered Medications Prior to Admission  Medication Dose Route Frequency Provider Last Rate Last  Admin   0.9 %  sodium chloride infusion  250 mL Intravenous PRN Serafina Mitchell, MD       Medications Prior to Admission  Medication Sig Dispense Refill   acetaminophen (TYLENOL) 500 MG tablet Take 1,000 mg by mouth every 6 (six) hours as needed for mild pain.     aspirin EC 81 MG tablet Take 1 tablet (81 mg total) by mouth daily. Swallow whole. 30 tablet 0   atorvastatin (LIPITOR) 40 MG tablet Take 1 tablet (40 mg total) by mouth daily. 30 tablet 0   calcium acetate (PHOSLO) 667 MG capsule Take 1,334-2,668 mg by mouth with breakfast, with lunch, and with evening meal. Take 4 capsules (2,668 mg) by mouth with each meal & take 2 capsules (1,334 mg) by mouth with snacks     losartan (COZAAR) 25 MG tablet Take 25 mg by mouth at bedtime.     metoprolol succinate (TOPROL XL) 25 MG 24 hr tablet Take 0.5 tablets (12.5 mg total) by mouth daily. 15 tablet 0   multivitamin (RENA-VIT) TABS tablet Take 1 tablet by mouth every evening.      Results for orders placed or performed during the hospital encounter of 08/19/22 (from the past 48 hour(s))  Basic metabolic panel     Status: Abnormal   Collection Time: 08/19/22 10:58 PM  Result Value Ref Range   Sodium 137 135 - 145 mmol/L   Potassium 4.0 3.5 - 5.1 mmol/L   Chloride 99 98 - 111 mmol/L   CO2 22 22 - 32 mmol/L   Glucose, Bld 106 (H) 70 - 99 mg/dL    Comment: Glucose reference range applies only to samples taken after fasting for at least 8 hours.   BUN 61 (H) 6 - 20 mg/dL   Creatinine, Ser 11.08 (H) 0.61 - 1.24 mg/dL   Calcium 8.8 (L) 8.9 - 10.3 mg/dL   GFR, Estimated 5 (L) >60 mL/min    Comment: (NOTE) Calculated using the CKD-EPI Creatinine Equation (2021)    Anion gap 16 (H) 5 - 15    Comment: Performed at [facility] [address], [place], [place] [address]  Troponin I (High Sensitivity)     Status: Abnormal   Collection Time: 08/19/22 10:58 PM  Result Value Ref Range   Troponin I (High Sensitivity) 105 (HH) <18 ng/L     Comment: CRITICAL RESULT CALLED TO, READ BACK BY AND VERIFIED WITH Cher Nakai, RN, [phone], 08/20/22, [place] (NOTE) Elevated high sensitivity troponin I (hsTnI) values and significant  changes across serial measurements may suggest ACS but many other  chronic and acute conditions are known to elevate hsTnI results.  Refer to the "Links" section for chest pain algorithms and additional  guidance. Performed at [facility], [place] [address], [place],  [address]   CBC     Status: Abnormal   Collection Time: 08/19/22 10:58 PM  Result Value Ref Range   WBC 9.8 4.0 - 10.5 K/uL  RBC 3.89 (L) 4.22 - 5.81 MIL/uL   Hemoglobin 11.8 (L) 13.0 - 17.0 g/dL   HCT 38.1 (L) 39.0 - 52.0 %   MCV 97.9 80.0 - 100.0 fL   MCH 30.3 26.0 - 34.0 pg   MCHC 31.0 30.0 - 36.0 g/dL   RDW 14.8 11.5 - 15.5 %   Platelets 299 150 - 400 K/uL   nRBC 0.0 0.0 - 0.2 %    Comment: Performed at [facility] [address], [place], [place] [address]  Brain natriuretic peptide     Status: None   Collection Time: 08/19/22 10:58 PM  Result Value Ref Range   B Natriuretic Peptide 62.9 0.0 - 100.0 pg/mL    Comment: Performed at [facility] [address] [place], [place] [address]  CBG monitoring, ED     Status: Abnormal   Collection Time: 08/19/22 11:03 PM  Result Value Ref Range   Glucose-Capillary 107 (H) 70 - 99 mg/dL    Comment: Glucose reference range applies only to samples taken after fasting for at least 8 hours.  Troponin I (High Sensitivity)     Status: Abnormal   Collection Time: 08/20/22 12:28 AM  Result Value Ref Range   Troponin I (High Sensitivity) 108 (HH) <18 ng/L    Comment: CRITICAL VALUE NOTED. VALUE IS CONSISTENT WITH PREVIOUSLY REPORTED/CALLED VALUE (NOTE) Elevated high sensitivity troponin I (hsTnI) values and significant  changes across serial measurements may suggest ACS but many other  chronic and acute conditions are known to elevate hsTnI results.  Refer to the  "Links" section for chest pain algorithms and additional  guidance. Performed at [facility], [place] [address], [place], [place] [address]   Glucose, capillary     Status: None   Collection Time: 08/20/22  7:45 AM  Result Value Ref Range   Glucose-Capillary 87 70 - 99 mg/dL    Comment: Glucose reference range applies only to samples taken after fasting for at least 8 hours.   DG Chest Portable 1 View  Result Date: 08/19/2022 CLINICAL DATA:  Left side chest pain EXAM: PORTABLE CHEST 1 VIEW COMPARISON:  08/16/2022 FINDINGS: Left dialysis catheter remains in place, unchanged. Heart mildly enlarged, stable. Small right pleural effusion and right basilar opacity, similar to prior study. No confluent opacity on the left. No acute bony abnormality. IMPRESSION: Stable small right pleural effusion with right base atelectasis or infiltrate. Electronically Signed   By: Rolm Baptise M.D.   On: 08/19/2022 23:13    Review Of Systems Constitutional: No fever, chills, Chronic weight gain. Eyes: No vision change, wears glasses. No discharge or pain. Ears: No hearing loss, No tinnitus. Respiratory: No asthma, COPD, pneumonias. Positive shortness of breath. No hemoptysis. Cardiovascular: Positive chest pain, no palpitation, leg edema. Gastrointestinal: No nausea, vomiting, diarrhea, constipation. No GI bleed. No hepatitis. Genitourinary: No dysuria, hematuria, kidney stone. No incontinance. Positive ESRD. Neurological: No headache, stroke, seizures.  Psychiatry: No psych facility admission for anxiety, depression, suicide. No detox. Skin: No rash. Musculoskeletal: No joint pain, fibromyalgia. No neck pain, back pain. Lymphadenopathy: No lymphadenopathy. Hematology: Mild anemia. No easy bruising.   Blood pressure 122/75, pulse 85, temperature 97.6 F (36.4 C), temperature source Oral, resp. rate 16, height 5\' 8"  (1.727 m), weight 114 kg, SpO2 99 %. Body mass index is 38.21 kg/m. General appearance:  alert, cooperative, appears stated age and no distress Head: Normocephalic, atraumatic. Eyes: Brown eyes, pink conjunctiva, corneas clear.  Neck: No adenopathy,  no carotid bruit, no JVD, supple, symmetrical, trachea midline and thyroid not enlarged. Resp: Clear to auscultation bilaterally. Cardio: Regular rate and rhythm, S1, S2 normal, II/VI systolic murmur, no click, rub or gallop GI: Soft, non-tender; bowel sounds normal; no organomegaly. Extremities: No edema, cyanosis or clubbing. Left sided TDC. Skin: Warm and dry.  Neurologic: Alert and oriented X 3, normal strength. Normal coordination.  Assessment/Plan Acute coronary syndrome Recent NSTEMI HTN ESRD with M-W-F hemodialysis OSA Anemia of chronic disease  Plan: Offered cardiac catheterization. Patient understood risks and procedure and wants me to proceed. NPO post mid-night.  Time spent: Review of old records, Lab, x-rays, EKG, other cardiac tests, examination, discussion with patient/Family/Doctor over 70 minutes.  Birdie Riddle, MD  08/20/2022, 10:25 AM

## 2022-08-20 NOTE — Progress Notes (Signed)
At admission, HD cath dressing was soiled. Upon changing the dressing, patient made staff aware that he was allergic to Chorhexidine, alcohol and tegaderm. Patient states peroxide is used to clean site. Patient need a order for guaze, peroxide  and paper tape dressings from Nephology.

## 2022-08-20 NOTE — H&P (Signed)
History and Physical    Patient: Jerry [name] VWU:[id number] DOB: October 18, 1970 DOA: 08/19/2022 DOS: the patient was seen and examined on 08/20/2022 PCP: Corliss Parish, MD  Patient coming from: Home  Chief Complaint:  Chief Complaint  Patient presents with   Chest Pain   HPI: Jerry [name] is a 52 y.o. male with medical history significant of ESRD MWF, OSA, HTN.  Pt with recent admit 1/31-2/1 for NSTEMI.  Preferred outpt work up for [facility] so discharged home.  Also felt to be volume overloaded on admission and symptoms seemed to improve some with dialysis.  Given option between [facility] in hospital vs discharge for further work up.  Pt chose discharge and deferring LHC at that time.  Pt back in to ED this evening with c/o CP.  Left sided CP, seems maybe a bit better with L arm above head.  6 beat run of VT on monitor in ED.  CP improved after given BP meds and put on NTG paste and BP down from 295A systolic to 213Y  Review of Systems: As mentioned in the history of present illness. All other systems reviewed and are negative. Past Medical History:  Diagnosis Date   Complication of anesthesia    "benadryl knocks me out; worse than any normal reactions; anesthesia/numbing RX wear off FAST" (01/30/2018) 2021 Took 3 weeks to able talk and eat soild food due to throat    ESRD (end stage renal disease) on dialysis ([place])    "HAD DIALYSIS BEFORE TRANSPLANT; RESTARTED DIALYSIS 01/30/2018)-  [facility]   GERD (gastroesophageal reflux disease)    History of blood transfusion    Hypertension    OSA on CPAP    Pneumonia    Past Surgical History:  Procedure Laterality Date   A/V FISTULAGRAM N/A 02/19/2020   Procedure: A/V FISTULAGRAM;  Surgeon: Serafina Mitchell, MD;  Location: [place] [facility];  Service: Cardiovascular;  Laterality: N/A;   AV FISTULA PLACEMENT Right 2014   AV FISTULA PLACEMENT Right 12/26/2019   Procedure: Repair and Revision of Right Forearm AV Fistula;  Surgeon:  Rosetta Posner, MD;  Location: [place];  Service: Vascular;  Laterality: Right;   AV FISTULA PLACEMENT Left 07/29/2020   Procedure: LEFT ARM BRACHIOCEPHALIC ARTERIOVENOUS (AV) FISTULA CREATION;  Surgeon: Waynetta Sandy, MD;  Location: [place];  Service: Vascular;  Laterality: Left;   [place] Left 11/09/2020   Procedure: LEFT BRACHIOCEPHALIC ARTERIOVENOUS FISTULA TRANSPOSITION;  Surgeon: Waynetta Sandy, MD;  Location: [place];  Service: Vascular;  Laterality: Left;   INSERTION OF DIALYSIS CATHETER Left 02/02/2020   Procedure: INSERTION OF LEFT INTERNAL JUGULAR 28 CM TUNNELED DIALYSIS CATHETER UNDER ULTRASOUND GUIDANCE;  Surgeon: Marty Heck, MD;  Location: [place];  Service: Vascular;  Laterality: Left;   KIDNEY TRANSPLANT  2014   at [place] Left 11/09/2020   Procedure: LIGATION OF COMPETING BRANCHES OF LEFT BRACHIOCEPHALIC ARTERIOVENOUS FISTULA;  Surgeon: Waynetta Sandy, MD;  Location: [place];  Service: Vascular;  Laterality: Left;   PARATHYROIDECTOMY  2014   at [place] Right 02/19/2020   Procedure: PERIPHERAL VASCULAR INTERVENTION;  Surgeon: Serafina Mitchell, MD;  Location: [place] [facility];  Service: Cardiovascular;  Laterality: Right;   REVISON OF ARTERIOVENOUS FISTULA Right 05/22/2020   Procedure: REPAIR OF RIGHT ARTERIOVENOUS FISTULA;  Surgeon: Waynetta Sandy, MD;  Location: [place];  Service: Vascular;  Laterality: Right;  Social History:  reports that he has quit smoking. His smoking use included cigars. He has never used smokeless tobacco. He reports current alcohol use of about 2.0 standard drinks of alcohol per week. He reports that he does not currently use drugs.  Allergies  Allergen Reactions   Chlorhexidine Itching    Burns skin   Other Other (See Comments)    Local anesthesia (quickly metabolizes)   Tape Other (See Comments)     Adhesive tape (irritates skin)    Family History  Problem Relation Age of Onset   Heart failure Mother    Kidney failure Father     Prior to Admission medications   Medication Sig Start Date End Date Taking? Authorizing Provider  acetaminophen (TYLENOL) 500 MG tablet Take 1,000 mg by mouth every 6 (six) hours as needed for mild pain.    [provider]  aspirin EC 81 MG tablet Take 1 tablet (81 mg total) by mouth daily. Swallow whole. 08/18/22   Patrecia Pour, MD  atorvastatin (LIPITOR) 40 MG tablet Take 1 tablet (40 mg total) by mouth daily. 08/18/22   Patrecia Pour, MD  calcium acetate (PHOSLO) 667 MG capsule Take [phone] mg by mouth with breakfast, with lunch, and with evening meal. Take 4 capsules (2,668 mg) by mouth with each meal & take 2 capsules (1,334 mg) by mouth with snacks 12/01/19   [provider]  losartan (COZAAR) 25 MG tablet Take 25 mg by mouth at bedtime. 08/06/19   [provider]  metoprolol succinate (TOPROL XL) 25 MG 24 hr tablet Take 0.5 tablets (12.5 mg total) by mouth daily. 08/17/22   Patrecia Pour, MD  multivitamin (RENA-VIT) TABS tablet Take 1 tablet by mouth every evening.    [provider]    Physical Exam: Vitals:   08/20/22 0130 08/20/22 0155 08/20/22 0200 08/20/22 0230  BP: (!) 146/117  (!) 151/108 (!) 156/95  Pulse: (!) 108  95 96  Resp: (!) 24  15 19   Temp:  98.8 F (37.1 C)    TempSrc:  Oral    SpO2: 98%  97% 98%  Weight:      Height:       Constitutional: NAD, calm, comfortable Respiratory: clear to auscultation bilaterally, no wheezing, no crackles. Normal respiratory effort. No accessory muscle use.  Cardiovascular: Regular rate and rhythm, no murmurs / rubs / gallops. No extremity edema. 2+ pedal pulses. No carotid bruits.  Abdomen: no tenderness, no masses palpated. No hepatosplenomegaly. Bowel sounds positive.  Neurologic: Grossly non-focal Psychiatric: Normal judgment and insight. Alert and oriented x  3. Normal mood.   Data Reviewed:       Latest Ref Rng & Units 08/19/2022   10:58 PM 08/17/2022    4:58 AM 08/16/2022    8:43 PM  CBC  WBC 4.0 - 10.5 K/uL 9.8  7.3  8.0   Hemoglobin 13.0 - 17.0 g/dL 11.8  12.8  13.0   Hematocrit 39.0 - 52.0 % 38.1  39.5  41.1   Platelets 150 - 400 K/uL 299  326  344       Latest Ref Rng & Units 08/19/2022   10:58 PM 08/17/2022    4:58 AM 08/16/2022    3:01 AM  CMP  Glucose 70 - 99 mg/dL 106  98  105   BUN 6 - 20 mg/dL 61  38  63   Creatinine 0.61 - 1.24 mg/dL 11.08  8.32  10.83   Sodium 135 -  145 mmol/L 137  135  136   Potassium 3.5 - 5.1 mmol/L 4.0  4.3  4.2   Chloride 98 - 111 mmol/L 99  96  97   CO2 22 - 32 mmol/L 22  26  23    Calcium 8.9 - 10.3 mg/dL 8.8  9.0  9.5    CXR IMPRESSION: Stable small right pleural effusion with right base atelectasis or infiltrate.  Assessment and Plan: * Chest pain Hypertensive urgency vs CAD. Recent admit for NSTEMI, LHC work up planned as outpt. Symptoms today are resolved following BP control and NTG paste. CP obs pathway Trops flat BP control: Resume home losartan Cont metoprolol Add PRN hydralazine Continue NTG paste Call Dr. Terrence Dupont in AM Given flat trops, will hold off on heparin drip for the moment. Tele monitor  End stage renal disease [facility]) Call nephrology in AM for routine dialysis during stay in hospital.      Advance Care Planning:   Code Status: Full Code  Consults: None, call Dr. Terrence Dupont in AM  Family Communication: Family at bedside  Severity of Illness: The appropriate patient status for this patient is OBSERVATION. Observation status is judged to be reasonable and necessary in order to provide the required intensity of service to ensure the patient's safety. The patient's presenting symptoms, physical exam findings, and initial radiographic and laboratory data in the context of their medical condition is felt to place them at decreased risk for further clinical deterioration.  Furthermore, it is anticipated that the patient will be medically stable for discharge from the hospital within 2 midnights of admission.   Author: Etta Quill., DO 08/20/2022 2:47 AM  For on call review [email or web address].

## 2022-08-20 NOTE — Assessment & Plan Note (Signed)
Call nephrology in AM for routine dialysis during stay in hospital.

## 2022-08-21 ENCOUNTER — Inpatient Hospital Stay (HOSPITAL_COMMUNITY)
Admission: EM | Disposition: A | Discharge status: Home / Self Care | Payer: Self-pay | Source: Home / Self Care | Attending: Internal Medicine

## 2022-08-21 DIAGNOSIS — R58 Hemorrhage, not elsewhere classified: Secondary | ICD-10-CM | POA: Insufficient documentation

## 2022-08-21 DIAGNOSIS — I25110 Atherosclerotic heart disease of native coronary artery with unstable angina pectoris: Secondary | ICD-10-CM

## 2022-08-21 HISTORY — PX: CORONARY STENT INTERVENTION: CATH118234

## 2022-08-21 HISTORY — PX: LEFT HEART CATH AND CORONARY ANGIOGRAPHY: CATH118249

## 2022-08-21 HISTORY — PX: PERIPHERAL VASCULAR INTERVENTION: CATH118257

## 2022-08-21 LAB — POCT ACTIVATED CLOTTING TIME
Activated Clotting Time: 201 seconds
Activated Clotting Time: 234 seconds
Activated Clotting Time: 201 seconds

## 2022-08-21 LAB — BASIC METABOLIC PANEL
Anion gap: 18 — ABNORMAL HIGH (ref 5–15)
BUN: 86 mg/dL — ABNORMAL HIGH (ref 6–20)
CO2: 19 mmol/L — ABNORMAL LOW (ref 22–32)
Calcium: 8.8 mg/dL — ABNORMAL LOW (ref 8.9–10.3)
Chloride: 97 mmol/L — ABNORMAL LOW (ref 98–111)
Creatinine, Ser: 14.5 mg/dL — ABNORMAL HIGH (ref 0.61–1.24)
GFR, Estimated: 4 mL/min — ABNORMAL LOW (ref >60)
Glucose, Bld: 95 mg/dL (ref 70–99)
Potassium: 4.3 mmol/L (ref 3.5–5.1)
Sodium: 134 mmol/L — ABNORMAL LOW (ref 135–145)

## 2022-08-21 LAB — CBC
HCT: 33.6 % — ABNORMAL LOW (ref 39.0–52.0)
Hemoglobin: 10.5 g/dL — ABNORMAL LOW (ref 13.0–17.0)
MCH: 29.8 pg (ref 26.0–34.0)
MCHC: 31.3 g/dL (ref 30.0–36.0)
MCV: 95.5 fL (ref 80.0–100.0)
Platelets: 296 10*3/uL (ref 150–400)
RBC: 3.52 MIL/uL — ABNORMAL LOW (ref 4.22–5.81)
RDW: 14.7 % (ref 11.5–15.5)
WBC: 7.9 10*3/uL (ref 4.0–10.5)
nRBC: 0 % (ref 0.0–0.2)

## 2022-08-21 LAB — MAGNESIUM: Magnesium: 2.4 mg/dL (ref 1.7–2.4)

## 2022-08-21 SURGERY — LEFT HEART CATH AND CORONARY ANGIOGRAPHY
Anesthesia: LOCAL | Laterality: Right

## 2022-08-21 MED ORDER — CEFAZOLIN SODIUM-DEXTROSE 2-4 GM/100ML-% IV SOLN
INTRAVENOUS | Status: AC
  Filled 2022-08-21: qty 100

## 2022-08-21 MED ORDER — SODIUM CHLORIDE 0.9 % IV SOLN: 250.0000 mL | INTRAVENOUS | Status: DC | PRN

## 2022-08-21 MED ORDER — HYDRALAZINE HCL 25 MG PO TABS
25.0000 mg | ORAL_TABLET | Freq: Three times a day (TID) | ORAL | Status: DC
  Administered 2022-08-21 – 2022-08-22 (×2): 25 mg via ORAL
  Filled 2022-08-21 (×2): qty 1

## 2022-08-21 MED ORDER — LABETALOL HCL 5 MG/ML IV SOLN: 10.0000 mg | INTRAVENOUS | Status: AC | PRN

## 2022-08-21 MED ORDER — LIDOCAINE HCL (PF) 1 % IJ SOLN
INTRAMUSCULAR | Status: AC
  Filled 2022-08-21: qty 30

## 2022-08-21 MED ORDER — LABETALOL HCL 5 MG/ML IV SOLN
INTRAVENOUS | Status: AC
  Filled 2022-08-21: qty 4

## 2022-08-21 MED ORDER — HEPARIN SODIUM (PORCINE) 1000 UNIT/ML IJ SOLN
1000.0000 [IU] | Freq: Once | INTRAMUSCULAR | Status: AC
  Administered 2022-08-21: 3200 [IU]

## 2022-08-21 MED ORDER — IOHEXOL 350 MG/ML SOLN
INTRAVENOUS | Status: DC | PRN
  Administered 2022-08-21: 220 mL

## 2022-08-21 MED ORDER — HYDRALAZINE HCL 20 MG/ML IJ SOLN
INTRAMUSCULAR | Status: DC | PRN
  Administered 2022-08-21: 10 mg via INTRAVENOUS

## 2022-08-21 MED ORDER — FENTANYL CITRATE (PF) 100 MCG/2ML IJ SOLN
INTRAMUSCULAR | Status: AC
  Filled 2022-08-21: qty 2

## 2022-08-21 MED ORDER — SODIUM CHLORIDE 0.9% FLUSH
3.0000 mL | Freq: Two times a day (BID) | INTRAVENOUS | Status: DC
  Administered 2022-08-21 – 2022-08-23 (×4): 3 mL via INTRAVENOUS

## 2022-08-21 MED ORDER — HYDRALAZINE HCL 20 MG/ML IJ SOLN
INTRAMUSCULAR | Status: AC
  Filled 2022-08-21: qty 1

## 2022-08-21 MED ORDER — MIDAZOLAM HCL 2 MG/2ML IJ SOLN
INTRAMUSCULAR | Status: AC
  Filled 2022-08-21: qty 2

## 2022-08-21 MED ORDER — CEFAZOLIN SODIUM-DEXTROSE 2-3 GM-%(50ML) IV SOLR
INTRAVENOUS | Status: AC | PRN
  Administered 2022-08-21: 2 g via INTRAVENOUS

## 2022-08-21 MED ORDER — FENTANYL CITRATE (PF) 100 MCG/2ML IJ SOLN
INTRAMUSCULAR | Status: DC | PRN
  Administered 2022-08-21 (×3): 25 ug via INTRAVENOUS
  Administered 2022-08-21 (×3): 50 ug via INTRAVENOUS

## 2022-08-21 MED ORDER — HEPARIN (PORCINE) IN NACL 1000-0.9 UT/500ML-% IV SOLN
INTRAVENOUS | Status: AC
  Filled 2022-08-21: qty 500

## 2022-08-21 MED ORDER — ASPIRIN 81 MG PO CHEW
81.0000 mg | CHEWABLE_TABLET | Freq: Every day | ORAL | Status: DC
  Filled 2022-08-21: qty 1

## 2022-08-21 MED ORDER — LIDOCAINE-EPINEPHRINE 1 %-1:100000 IJ SOLN
INTRAMUSCULAR | Status: AC
  Filled 2022-08-21: qty 1

## 2022-08-21 MED ORDER — TICAGRELOR 90 MG PO TABS
ORAL_TABLET | ORAL | Status: DC | PRN
  Administered 2022-08-21: 180 mg via ORAL

## 2022-08-21 MED ORDER — LIDOCAINE-EPINEPHRINE 1 %-1:100000 IJ SOLN
INTRAMUSCULAR | Status: DC | PRN
  Administered 2022-08-21 (×2): 2 mL

## 2022-08-21 MED ORDER — HEPARIN SODIUM (PORCINE) 1000 UNIT/ML IJ SOLN
INTRAMUSCULAR | Status: DC | PRN
  Administered 2022-08-21: 4000 [IU] via INTRAVENOUS
  Administered 2022-08-21: 5000 [IU] via INTRAVENOUS

## 2022-08-21 MED ORDER — TICAGRELOR 90 MG PO TABS
90.0000 mg | ORAL_TABLET | Freq: Two times a day (BID) | ORAL | Status: DC
  Administered 2022-08-21 – 2022-08-23 (×4): 90 mg via ORAL
  Filled 2022-08-21 (×4): qty 1

## 2022-08-21 MED ORDER — ASPIRIN 81 MG PO CHEW
CHEWABLE_TABLET | ORAL | Status: DC | PRN
  Administered 2022-08-21: 324 mg via ORAL

## 2022-08-21 MED ORDER — LABETALOL HCL 5 MG/ML IV SOLN
INTRAVENOUS | Status: DC | PRN
  Administered 2022-08-21: 10 mg via INTRAVENOUS
  Administered 2022-08-21: 20 mg via INTRAVENOUS

## 2022-08-21 MED ORDER — TRAZODONE HCL 100 MG PO TABS
100.0000 mg | ORAL_TABLET | Freq: Once | ORAL | Status: DC
  Filled 2022-08-21: qty 1

## 2022-08-21 MED ORDER — HEPARIN SODIUM (PORCINE) 1000 UNIT/ML IJ SOLN
INTRAMUSCULAR | Status: AC
  Filled 2022-08-21: qty 10

## 2022-08-21 MED ORDER — HEPARIN (PORCINE) IN NACL 1000-0.9 UT/500ML-% IV SOLN
INTRAVENOUS | Status: DC | PRN
  Administered 2022-08-21 (×2): 500 mL

## 2022-08-21 MED ORDER — NITROGLYCERIN 1 MG/10 ML FOR IR/CATH LAB
INTRA_ARTERIAL | Status: DC | PRN
  Administered 2022-08-21 (×3): 200 ug via INTRACORONARY

## 2022-08-21 MED ORDER — ATORVASTATIN CALCIUM 80 MG PO TABS
80.0000 mg | ORAL_TABLET | Freq: Every day | ORAL | Status: DC
  Administered 2022-08-22 – 2022-08-23 (×2): 80 mg via ORAL
  Filled 2022-08-21 (×2): qty 1

## 2022-08-21 MED ORDER — HEPARIN SODIUM (PORCINE) 1000 UNIT/ML IJ SOLN
1000.0000 [IU] | Freq: Once | INTRAMUSCULAR | Status: DC
  Filled 2022-08-21: qty 1

## 2022-08-21 MED ORDER — SODIUM CHLORIDE 0.9% FLUSH: 3.0000 mL | INTRAVENOUS | Status: DC | PRN

## 2022-08-21 MED ORDER — SODIUM CHLORIDE 0.9 % IV SOLN
INTRAVENOUS | Status: AC | PRN
  Administered 2022-08-21: 10 mL/h via INTRAVENOUS

## 2022-08-21 MED ORDER — NITROGLYCERIN 1 MG/10 ML FOR IR/CATH LAB
INTRA_ARTERIAL | Status: AC
  Filled 2022-08-21: qty 10

## 2022-08-21 MED ORDER — MIDAZOLAM HCL 2 MG/2ML IJ SOLN
INTRAMUSCULAR | Status: DC | PRN
  Administered 2022-08-21: 2 mg via INTRAVENOUS
  Administered 2022-08-21 (×3): 1 mg via INTRAVENOUS

## 2022-08-21 MED ORDER — LIDOCAINE HCL (PF) 1 % IJ SOLN
INTRAMUSCULAR | Status: DC | PRN
  Administered 2022-08-21 (×2): 15 mL

## 2022-08-21 SURGICAL SUPPLY — 41 items
BALLN EMERGE MR 2.5X20 (BALLOONS) ×2
BALLN MUSTANG 10X20X75 (BALLOONS) ×2
BALLOON EMERGE MR 2.5X20 (BALLOONS) IMPLANT
BALLOON MUSTANG 10X20X75 (BALLOONS) IMPLANT
CATH INFINITI 5 FR IM (CATHETERS) IMPLANT
CATH INFINITI 5FR JL4 (CATHETERS) IMPLANT
CATH INFINITI 5FR JL5 (CATHETERS) IMPLANT
CATH INFINITI 5FR MULTPACK ANG (CATHETERS) IMPLANT
CATH LAUNCHER 6FR JR4 (CATHETERS) IMPLANT
CLOSURE PERCLOSE PROSTYLE (VASCULAR PRODUCTS) IMPLANT
ELECT DEFIB PAD ADLT CADENCE (PAD) IMPLANT
GLIDEWIRE NITREX 0.018X80X5 (WIRE) ×2
GUIDEWIRE NITREX 0.018X80X5 (WIRE) IMPLANT
GUIDEWIRE SAFE TJ AMPLATZ EXST (WIRE) IMPLANT
KIT ENCORE 26 ADVANTAGE (KITS) IMPLANT
KIT HEART LEFT (KITS) ×2 IMPLANT
KIT MICROPUNCTURE NIT STIFF (SHEATH) IMPLANT
MAT PREVALON FULL STRYKER (MISCELLANEOUS) IMPLANT
NDL SMART REG 18GX2-3/4 (NEEDLE) IMPLANT
NEEDLE SMART REG 18GX2-3/4 (NEEDLE) ×2 IMPLANT
PACK CARDIAC CATHETERIZATION (CUSTOM PROCEDURE TRAY) ×2 IMPLANT
PINNACLE LONG 6F 25CM (SHEATH) ×2
PROTECTION STATION PRESSURIZED (MISCELLANEOUS) ×2
SHEATH CATAPULT 7FR 45 (SHEATH) IMPLANT
SHEATH INTRO PINNACLE 6F 25CM (SHEATH) IMPLANT
SHEATH PINNACLE 5F 10CM (SHEATH) IMPLANT
SHEATH PINNACLE 6F 10CM (SHEATH) IMPLANT
SHEATH PROBE COVER 6X72 (BAG) IMPLANT
STATION PROTECTION PRESSURIZED (MISCELLANEOUS) IMPLANT
STENT SYNERGY XD 3.0X48 (Permanent Stent) IMPLANT
STENT VIABAHN 8X29X80 VBX (Permanent Stent) IMPLANT
SYNERGY XD 3.0X48 (Permanent Stent) ×2 IMPLANT
SYR CONTROL 10ML ANGIOGRAPHIC (SYRINGE) IMPLANT
TAPE SHOOT N SEE (TAPE) IMPLANT
TRANSDUCER W/STOPCOCK (MISCELLANEOUS) ×2 IMPLANT
TUBING CIL FLEX 10 FLL-RA (TUBING) IMPLANT
WIRE BENTSON .035X145CM (WIRE) IMPLANT
WIRE COUGAR XT STRL 190CM (WIRE) IMPLANT
WIRE EMERALD 3MM-J .035X150CM (WIRE) IMPLANT
WIRE G V18X300CM (WIRE) IMPLANT
WIRE HI TORQ VERSACORE-J 145CM (WIRE) IMPLANT

## 2022-08-21 NOTE — Interval H&P Note (Signed)
History and Physical Interval Note:  08/21/2022 7:58 AM  Jerry [name]  has presented today for surgery, with the diagnosis of unstable angina.  The various methods of treatment have been discussed with the patient and family. After consideration of risks, benefits and other options for treatment, the patient has consented to  Procedure(s): LEFT HEART CATH AND CORONARY ANGIOGRAPHY (N/A) as a surgical intervention.  The patient's history has been reviewed, patient examined, no change in status, stable for surgery.  I have reviewed the patient's chart and labs.  Questions were answered to the patient's satisfaction.     Birdie Riddle

## 2022-08-21 NOTE — Progress Notes (Signed)
[facility] Progress Note   Subjective:  Seen in room s/p LHC where stent was placed in RCA as well as R external iliac artery stent. He is quite drowsy - will wake and answer simple questions, then right back to sleep. Denies CP at the moment. NTG paste still in place.  Objective Vitals:   08/21/22 1108 08/21/22 1118 08/21/22 1145 08/21/22 1223  BP: (!) 165/105  125/67 (!) 162/78  Pulse: (!) 0 (!) 0 76 69  Resp:   15 14  Temp:   (!) 97.4 F (36.3 C)   TempSrc:   Oral   SpO2:   97%   Weight:      Height:       Physical Exam General: Well appearing but drowsy man, NAD. Heart: RRR; no murmur Lungs: CTA anteriorly Abdomen: soft Extremities: no LE edema Dialysis Access:  TDC in L chest  Additional Objective Labs: Basic Metabolic Panel: Recent Labs  Lab 08/17/22 0458 08/19/22 2258 08/21/22 0138  NA 135 137 134*  K 4.3 4.0 4.3  CL 96* 99 97*  CO2 26 22 19*  GLUCOSE 98 106* 95  BUN 38* 61* 86*  CREATININE 8.32* 11.08* 14.50*  CALCIUM 9.0 8.8* 8.8*   CBC: Recent Labs  Lab 08/16/22 0205 08/16/22 2043 08/17/22 0458 08/19/22 2258 08/21/22 0138  WBC 7.3 8.0 7.3 9.8 7.9  HGB 11.7* 13.0 12.8* 11.8* 10.5*  HCT 37.3* 41.1 39.5 38.1* 33.6*  MCV 96.9 94.3 94.5 97.9 95.5  PLT 302 344 326 299 296   Studies/Results: CARDIAC CATHETERIZATION  Result Date: 08/21/2022   Prox LAD lesion is 50% stenosed.   Dist Cx lesion is 50% stenosed.   Prox RCA lesion is 40% stenosed.   Mid RCA lesion is 50% stenosed.   Dist RCA lesion is 90% stenosed.   RPDA lesion is 99% stenosed.   There is moderate left ventricular systolic dysfunction.   LV end diastolic pressure is moderately elevated.   The left ventricular ejection fraction is 35-45% by visual estimate. RCA stenting/angioplasty by Dr. Malachy Mood Chest Portable 1 View  Result Date: 08/19/2022 CLINICAL DATA:  Left side chest pain EXAM: PORTABLE CHEST 1 VIEW COMPARISON:  08/16/2022 FINDINGS: Left dialysis catheter remains  in place, unchanged. Heart mildly enlarged, stable. Small right pleural effusion and right basilar opacity, similar to prior study. No confluent opacity on the left. No acute bony abnormality. IMPRESSION: Stable small right pleural effusion with right base atelectasis or infiltrate. Electronically Signed   By: Rolm Baptise M.D.   On: 08/19/2022 23:13    Medications:  sodium chloride      [START ON 08/22/2022] aspirin  81 mg Oral Daily   aspirin EC  81 mg Oral Daily   atorvastatin  40 mg Oral Daily   calcium acetate  1,334 mg Oral BID BM   calcium acetate  2,668 mg Oral TID with meals   heparin  5,000 Units Subcutaneous Q8H   hydrALAZINE  25 mg Oral Q8H   losartan  25 mg Oral QHS   metoprolol succinate  12.5 mg Oral Daily   multivitamin  1 tablet Oral QPM   nitroGLYCERIN  1 inch Topical Q6H   ticagrelor  90 mg Oral BID   traZODone  100 mg Oral Once    Dialysis Orders: SW MWF 4h 8min 2/2 bath EDW 110kg 400/500 no UFP Hectorol 49mcg IVP TIW No Heparin, no ESA  Assessment/Plan: Chest pain: + recent NSTEMI, new HFrEF 45-50% and  small WMA. S/p LHC this AM - s/p stent to RCA as well as R external iliac. Needs DAPT x 1 year per notes. ESRD: On MWF schedule - for HD today, EDW lowered to 110kg with last admit - unclear why didn't get down to that on Saturday as outpatient. HTN/volume: BP variable, continue home BB + ARB + hydralazine. Anemia:  Hgb 10.5, no ESA for now. Secondary hyperparathyroidism: Ca/Phos ok - continue home meds. HFrEF: EF 45-50%  [name], PA-C 08/21/2022, 12:29 PM  [facility]

## 2022-08-21 NOTE — Progress Notes (Signed)
Post hemodialysis   08/21/22 1955  Vitals  Temp 98 F (36.7 C)  Pulse Rate 100  Resp (!) 21  BP 119/84  SpO2 100 %  O2 Device Room Air  Weight  (unable to obtain, movement restriction post procedure)  Type of Weight Post-Dialysis  Oxygen Therapy  Patient Activity (if Appropriate) In bed  Pulse Oximetry Type Continuous  Post Treatment  Dialyzer Clearance Lightly streaked  Duration of HD Treatment -hour(s) 4 hour(s)  Fluid Removed (mL) 1500 mL  Tolerated HD Treatment Yes  Post-Hemodialysis Comments Tx tolerated w/ hypotension (received Hydralazine pre HD Tx)  in last hour UF goal reduced, B/P stable post rinse back.

## 2022-08-21 NOTE — Progress Notes (Signed)
   08/21/22 2043  Assess: MEWS Score  Temp 98.1 F (36.7 C)  BP 99/77  MAP (mmHg) 84  Pulse Rate (!) 107  ECG Heart Rate (!) 106  Resp 14  SpO2 99 %  O2 Device Room Air  Assess: MEWS Score  MEWS Temp 0  MEWS Systolic 1  MEWS Pulse 1  MEWS RR 0  MEWS LOC 0  MEWS Score 2  MEWS Score Color Yellow  Assess: if the MEWS score is Yellow or Red  Were vital signs taken at a resting state? Yes  Focused Assessment No change from prior assessment  Does the patient meet 2 or more of the SIRS criteria? No  MEWS guidelines implemented *See Row Information* Yes  Treat  MEWS Interventions Administered scheduled meds/treatments;Administered prn meds/treatments  Pain Scale 0-10  Pain Score 6  Pain Type Acute pain  Pain Location Back  Pain Orientation Lower  Pain Radiating Towards denies  Pain Descriptors / Indicators Sore  Pain Frequency Intermittent  Pain Onset On-going  Patients Stated Pain Goal 0  Pain Intervention(s) Medication (See eMAR);Repositioned  Multiple Pain Sites No  Take Vital Signs  Increase Vital Sign Frequency  Yellow: Q 2hr X 2 then Q 4hr X 2, if remains yellow, continue Q 4hrs  Escalate  MEWS: Escalate Yellow: discuss with charge nurse/RN and consider discussing with provider and RRT  Notify: Charge Nurse/RN  Name of Charge Nurse/RN Notified Jill, RN  Date Charge Nurse/RN Notified 08/21/22  Time Charge Nurse/RN Notified 2044  Document  Patient Outcome Other (Comment) (Asymptomatic.)  Progress note created (see row info) Yes  Assess: SIRS CRITERIA  SIRS Temperature  0  SIRS Pulse 1  SIRS Respirations  0  SIRS WBC 0  SIRS Score Sum  1

## 2022-08-21 NOTE — Progress Notes (Signed)
Subjective:  Patient seen in hemodialysis unit tolerating hemodialysis.  Patient underwent left cardiac catheterization/PCI to distal RCA and covered stenting of right external iliac earlier today.  Patient denies any chest pain but complains of mild shortness of breath receiving hemodialysis now.  Objective:  Vital Signs in the last 24 hours: Temp:  [97.4 F (36.3 C)-97.9 F (36.6 C)] 97.5 F (36.4 C) (02/05 1531) Pulse Rate:  [0-95] 89 (02/05 1600) Resp:  [9-25] 22 (02/05 1630) BP: (106-227)/(67-136) 151/124 (02/05 1630) SpO2:  [71 %-100 %] 100 % (02/05 1600)  Intake/Output from previous day: No intake/output data recorded. Intake/Output from this shift: Total I/O In: 485.6 [P.O.:360; I.V.:75.6; IV Piggyback:50] Out: -   Physical Exam: Neck: no adenopathy, no carotid bruit, and supple, symmetrical, trachea midline Lungs: Decreased breath sounds at bases Heart: regular rate and rhythm, S1, S2 normal, and 2/6 systolic murmur noted no pericardial rub Abdomen: soft, non-tender; bowel sounds normal; no masses,  no organomegaly Extremities: extremities normal, atraumatic, no cyanosis or edema and both groin dressing dry  Lab Results: Recent Labs    08/19/22 2258 08/21/22 0138  WBC 9.8 7.9  HGB 11.8* 10.5*  PLT 299 296   Recent Labs    08/19/22 2258 08/21/22 0138  NA 137 134*  K 4.0 4.3  CL 99 97*  CO2 22 19*  GLUCOSE 106* 95  BUN 61* 86*  CREATININE 11.08* 14.50*   No results for input(s): "TROPONINI" in the last 72 hours.  Invalid input(s): "CK", "MB" Hepatic Function Panel No results for input(s): "PROT", "ALBUMIN", "AST", "ALT", "ALKPHOS", "BILITOT", "BILIDIR", "IBILI" in the last 72 hours. No results for input(s): "CHOL" in the last 72 hours. No results for input(s): "PROTIME" in the last 72 hours.  Imaging: Imaging results have been reviewed and CARDIAC CATHETERIZATION  Result Date: 08/21/2022   Prox LAD lesion is 50% stenosed.   Dist Cx lesion is 50%  stenosed.   Prox RCA lesion is 40% stenosed.   Mid RCA lesion is 50% stenosed.   Dist RCA lesion is 90% stenosed.   RPDA lesion is 99% stenosed.   There is moderate left ventricular systolic dysfunction.   LV end diastolic pressure is moderately elevated.   The left ventricular ejection fraction is 35-45% by visual estimate. RCA stenting/angioplasty by Dr. Malachy Mood Chest Portable 1 View  Result Date: 08/19/2022 CLINICAL DATA:  Left side chest pain EXAM: PORTABLE CHEST 1 VIEW COMPARISON:  08/16/2022 FINDINGS: Left dialysis catheter remains in place, unchanged. Heart mildly enlarged, stable. Small right pleural effusion and right basilar opacity, similar to prior study. No confluent opacity on the left. No acute bony abnormality. IMPRESSION: Stable small right pleural effusion with right base atelectasis or infiltrate. Electronically Signed   By: Rolm Baptise M.D.   On: 08/19/2022 23:13    Cardiac Studies:  Assessment/Plan:  Acute coronary syndrome status post left cardiac catheterization/PTCA stenting to distal RCA and covered stent to right external iliac Hypertension End-stage renal disease on hemodialysis Mild volume overload GERD Obstructive sleep apnea Anemia of chronic disease Plan Increase atorvastatin to 80 mg daily Will uptitrate beta-blockers as blood pressure tolerates  LOS: 1 day    Charolette Forward 08/21/2022, 4:56 PM

## 2022-08-21 NOTE — Progress Notes (Addendum)
PROGRESS NOTE    [name]  NID:[id number] DOB: 07/06/1971 DOA: 08/19/2022 PCP: Corliss Parish, MD   Brief Narrative:    Jerry [name] is a 52 y.o. male with medical history significant of ESRD MWF, OSA, HTN.   Pt with recent admit 1/31-2/1 for NSTEMI.  Preferred outpt work up for [facility] so discharged home.  He now returns with complaints of left-sided chest pain as well as elevated blood pressure readings.  Blood pressures continue to remain somewhat elevated and cardiology following.  He is status post cardiac catheterization 2/5 and has undergone RCA stenting/angioplasty.  Assessment & Plan:   Principal Problem:   Chest pain Active Problems:   End stage renal disease (HCC)   Hypertensive urgency   Non-ST elevation (NSTEMI) myocardial infarction [facility])   Coronary artery disease involving native coronary artery of native heart with unstable angina pectoris (HCC)   Iliac artery bleed, right  Assessment and Plan:  ACS with recent NSTEMI -S/P Cardiac cath with stent to RCA 2/5 and R external iliac stent -Further management per Cardiology   End stage renal disease [facility]) Discussed with Dr. Augustin Coupe who will evaluate for dialysis starting 2/5  Obesity BMI 38.21    DVT prophylaxis:Heparin Code Status: Full Family Communication: None at bedside Disposition Plan:  Status is: Inpatient Remains inpatient appropriate because: Need for IV medications and close monitoring   Consultants:  Nephrology Cardiology Dr. Doylene Canard  Procedures:  LHC 2/5  Antimicrobials:  None   Subjective: No acute concerns or events noted overnight. Patient off for LHC.  Objective: Vitals:   08/21/22 1104 08/21/22 1106 08/21/22 1108 08/21/22 1118  BP: (!) 155/109 (!) 165/105 (!) 165/105   Pulse: 77 71 (!) 0 (!) 0  Resp: 16 13    Temp:      TempSrc:      SpO2:      Weight:      Height:       No intake or output data in the 24 hours ending 08/21/22 1123  Filed Weights   08/19/22  2149 08/20/22 0408  Weight: 113 kg 114 kg     Data Reviewed: I have personally reviewed following labs and imaging studies  CBC: Recent Labs  Lab 08/16/22 0205 08/16/22 2043 08/17/22 0458 08/19/22 2258 08/21/22 0138  WBC 7.3 8.0 7.3 9.8 7.9  HGB 11.7* 13.0 12.8* 11.8* 10.5*  HCT 37.3* 41.1 39.5 38.1* 33.6*  MCV 96.9 94.3 94.5 97.9 95.5  PLT 302 344 326 299 144   Basic Metabolic Panel: Recent Labs  Lab 08/16/22 0205 08/16/22 0301 08/17/22 0458 08/19/22 2258 08/21/22 0138  NA  --  136 135 137 134*  K  --  4.2 4.3 4.0 4.3  CL  --  97* 96* 99 97*  CO2  --  23 26 22  19*  GLUCOSE  --  105* 98 106* 95  BUN  --  63* 38* 61* 86*  CREATININE  --  10.83* 8.32* 11.08* 14.50*  CALCIUM  --  9.5 9.0 8.8* 8.8*  MG 2.6*  --   --   --  2.4   GFR: Estimated Creatinine Clearance: 7.4 mL/min (A) (by C-G formula based on SCr of 14.5 mg/dL (H)). Liver Function Tests: No results for input(s): "AST", "ALT", "ALKPHOS", "BILITOT", "PROT", "ALBUMIN" in the last 168 hours. No results for input(s): "LIPASE", "AMYLASE" in the last 168 hours. No results for input(s): "AMMONIA" in the last 168 hours. Coagulation Profile: No results for input(s): "INR", "PROTIME" in the last  168 hours. Cardiac Enzymes: No results for input(s): "CKTOTAL", "CKMB", "CKMBINDEX", "TROPONINI" in the last 168 hours. BNP (last 3 results) No results for input(s): "PROBNP" in the last 8760 hours. HbA1C: No results for input(s): "HGBA1C" in the last 72 hours. CBG: Recent Labs  Lab 08/19/22 2303 08/20/22 0745  GLUCAP 107* 87   Lipid Profile: No results for input(s): "CHOL", "HDL", "LDLCALC", "TRIG", "CHOLHDL", "LDLDIRECT" in the last 72 hours. Thyroid Function Tests: No results for input(s): "TSH", "T4TOTAL", "FREET4", "T3FREE", "THYROIDAB" in the last 72 hours. Anemia Panel: No results for input(s): "VITAMINB12", "FOLATE", "FERRITIN", "TIBC", "IRON", "RETICCTPCT" in the last 72 hours. Sepsis Labs: No results  for input(s): "PROCALCITON", "LATICACIDVEN" in the last 168 hours.  No results found for this or any previous visit (from the past 240 hour(s)).       Radiology Studies: CARDIAC CATHETERIZATION  Result Date: 08/21/2022   Prox LAD lesion is 50% stenosed.   Dist Cx lesion is 50% stenosed.   Prox RCA lesion is 40% stenosed.   Mid RCA lesion is 50% stenosed.   Dist RCA lesion is 90% stenosed.   RPDA lesion is 99% stenosed.   There is moderate left ventricular systolic dysfunction.   LV end diastolic pressure is moderately elevated.   The left ventricular ejection fraction is 35-45% by visual estimate. RCA stenting/angioplasty by Dr. Malachy Mood Chest Portable 1 View  Result Date: 08/19/2022 CLINICAL DATA:  Left side chest pain EXAM: PORTABLE CHEST 1 VIEW COMPARISON:  08/16/2022 FINDINGS: Left dialysis catheter remains in place, unchanged. Heart mildly enlarged, stable. Small right pleural effusion and right basilar opacity, similar to prior study. No confluent opacity on the left. No acute bony abnormality. IMPRESSION: Stable small right pleural effusion with right base atelectasis or infiltrate. Electronically Signed   By: Rolm Baptise M.D.   On: 08/19/2022 23:13        Scheduled Meds:  [START ON 08/22/2022] aspirin  81 mg Oral Daily   [MAR Hold] aspirin EC  81 mg Oral Daily   [MAR Hold] atorvastatin  40 mg Oral Daily   [MAR Hold] calcium acetate  1,334 mg Oral BID BM   [MAR Hold] calcium acetate  2,668 mg Oral TID with meals   [MAR Hold] heparin  5,000 Units Subcutaneous Q8H   [MAR Hold] losartan  25 mg Oral QHS   [MAR Hold] metoprolol succinate  12.5 mg Oral Daily   [MAR Hold] multivitamin  1 tablet Oral QPM   [MAR Hold] nitroGLYCERIN  1 inch Topical Q6H   ticagrelor  90 mg Oral BID   [MAR Hold] traZODone  100 mg Oral Once     LOS: 1 day    Time spent: 35 minutes    [name] Darleen Crocker, DO [facility]  If 7PM-7AM, please contact night-coverage [email or web address] 08/21/2022, 11:23  AM

## 2022-08-21 NOTE — Plan of Care (Signed)
  Problem: Education: Goal: Knowledge of General Education information will improve Description: Including pain rating scale, medication(s)/side effects and non-pharmacologic comfort measures Outcome: Progressing   Problem: Health Behavior/Discharge Planning: Goal: Ability to manage health-related needs will improve Outcome: Progressing   Problem: Clinical Measurements: Goal: Respiratory complications will improve Outcome: Progressing   Problem: Clinical Measurements: Goal: Cardiovascular complication will be avoided Outcome: Progressing   Problem: Activity: Goal: Risk for activity intolerance will decrease Outcome: Progressing   Problem: Coping: Goal: Level of anxiety will decrease Outcome: Progressing   Problem: Skin Integrity: Goal: Risk for impaired skin integrity will decrease Outcome: Progressing   Problem: Cardiovascular: Goal: Ability to achieve and maintain adequate cardiovascular perfusion will improve Outcome: Progressing Goal: Vascular access site(s) Level 0-1 will be maintained Outcome: Progressing   Problem: Health Behavior/Discharge Planning: Goal: Ability to safely manage health-related needs after discharge will improve Outcome: Progressing

## 2022-08-21 NOTE — Progress Notes (Signed)
Pt receives out-pt HD at [facility] on MWF. Will assist as needed.   Jamelyn Bovard Renal Navigator [phone] 

## 2022-08-21 NOTE — Progress Notes (Signed)
Pt stated he doesn't want to wear CPAP tonight. Pt stated he can't tolerate the mask around his face.

## 2022-08-21 NOTE — Progress Notes (Signed)
SpO2 dropped to 80's while asleep. Hooked to oxygen via [place] at 2 lpm. Sp02= 98%. Refused CPAP. Plan of care ongoing.

## 2022-08-22 ENCOUNTER — Encounter (HOSPITAL_COMMUNITY): Payer: Self-pay | Admitting: Cardiovascular Disease

## 2022-08-22 DIAGNOSIS — R079 Chest pain, unspecified: Secondary | ICD-10-CM | POA: Diagnosis not present

## 2022-08-22 LAB — CBC
HCT: 31.7 % — ABNORMAL LOW (ref 39.0–52.0)
Hemoglobin: 10.3 g/dL — ABNORMAL LOW (ref 13.0–17.0)
MCH: 30.5 pg (ref 26.0–34.0)
MCHC: 32.5 g/dL (ref 30.0–36.0)
MCV: 93.8 fL (ref 80.0–100.0)
Platelets: 306 10*3/uL (ref 150–400)
RBC: 3.38 MIL/uL — ABNORMAL LOW (ref 4.22–5.81)
RDW: 14.9 % (ref 11.5–15.5)
WBC: 8.2 10*3/uL (ref 4.0–10.5)
nRBC: 0 % (ref 0.0–0.2)

## 2022-08-22 LAB — BASIC METABOLIC PANEL
Anion gap: 15 (ref 5–15)
BUN: 45 mg/dL — ABNORMAL HIGH (ref 6–20)
CO2: 23 mmol/L (ref 22–32)
Calcium: 9.2 mg/dL (ref 8.9–10.3)
Chloride: 97 mmol/L — ABNORMAL LOW (ref 98–111)
Creatinine, Ser: 9.13 mg/dL — ABNORMAL HIGH (ref 0.61–1.24)
GFR, Estimated: 6 mL/min — ABNORMAL LOW (ref >60)
Glucose, Bld: 88 mg/dL (ref 70–99)
Potassium: 4.3 mmol/L (ref 3.5–5.1)
Sodium: 135 mmol/L (ref 135–145)

## 2022-08-22 LAB — MAGNESIUM: Magnesium: 2 mg/dL (ref 1.7–2.4)

## 2022-08-22 LAB — HEPATITIS B SURFACE ANTIBODY, QUANTITATIVE: Hep B S AB Quant (Post): 41.1 m[IU]/mL (ref >9.9)

## 2022-08-22 MED ORDER — HYDRALAZINE HCL 10 MG PO TABS
10.0000 mg | ORAL_TABLET | Freq: Three times a day (TID) | ORAL | Status: DC
  Administered 2022-08-22 – 2022-08-23 (×3): 10 mg via ORAL
  Filled 2022-08-22 (×3): qty 1

## 2022-08-22 MED ORDER — METOPROLOL SUCCINATE ER 50 MG PO TB24
50.0000 mg | ORAL_TABLET | Freq: Every day | ORAL | Status: DC
  Administered 2022-08-23: 50 mg via ORAL
  Filled 2022-08-22: qty 1

## 2022-08-22 NOTE — Progress Notes (Signed)
Pt placed on overnight pulse ox monitoring  

## 2022-08-22 NOTE — Progress Notes (Signed)
  Transition of Care [facility]) Screening Note   Patient Details  Name: Jerry [name] Date of Birth: 23-Sep-1970   Transition of [facility]) CM/SW Contact:    Milas Gain, [place] Phone Number: 08/22/2022, 2:06 PM    Transition of Care Department [facility]) has reviewed patient and no TOC needs have been identified at this time. We will continue to monitor patient advancement through interdisciplinary progression rounds. If new patient transition needs arise, please place a TOC consult.

## 2022-08-22 NOTE — Progress Notes (Signed)
PROGRESS NOTE Jerry [name]  SKA:[id number] DOB: 1970-08-24 DOA: 08/19/2022 PCP: Corliss Parish, MD   Brief Narrative/Hospital Course: 52 year old male with multiple medical history including ESRD on HD MWF, OSA, hypertension, anemia of chronic renal disease, secondary hyperparathyroidism,  recently admitted 1/31-2/1 for NSTEMI.  Preferred outpt work up for [facility] so discharged home.  He now returns with complaints of left-sided chest pain as well as elevated blood pressure readings. He was seen in the ED, underwent cardiac evaluation, found to have ACS with recent non-ST MI.  Underwent cardiac cath with stent to RCA 2/5 and right external iliac stent.  Overnight patient was hypoxic up to 80s during sleep placed on 2 L nasal cannula and saturation improved 98% patient refused CPAP  ACS w/ recent NSTEMI: S/P stent to distal RCA and covered stent to the right external iliac: Appreciate cardiology input increasing Lipitor to 80 mg, updated beta-blockers for blood pressure.  Continue aspirin 81 mg, Brilinta, hydralazine  ESRD on HD OMB:TDHRC managed by cardiology on HD 2/5 OSA CPAP last night and placed on oxygen Hypertension: BP is elevated at times, cardiology uptitrating beta-blocker.   Anemia of chronic renal disease: Hemoglobin is stable.  Monitor secondary hyperparathyroidism: Monitor calcium and Phos Obesity w/ OSA: HAD cpap machine then lost weight and not needed cpap.Not using cpap and does not have one.      Subjective: Seen and examined this morning resting comfortably.  Complains of pain negative overnight. Overnight patient was hypoxic up to 80s during sleep placed on 2 L nasal cannula and saturation improved 98% patient refused CPAP   Assessment and Plan: Principal Problem:   Chest pain Active Problems:   End stage renal disease (HCC)   Hypertensive urgency   Non-ST elevation (NSTEMI) myocardial infarction [facility])   Coronary artery disease involving native coronary artery  of native heart with unstable angina pectoris (HCC)   Iliac artery bleed, right  ACS w/ recent NSTEMI: S/P stent to distal RCA and covered stent to the right external iliac: Appreciate cardiology input increasing Lipitor to 80 mg, uptitrate beta-blockers for blood pressure.  Continue aspirin 81 mg, Brilinta, hydralazine.  ESRD on HD BUL:AGTXM managed by  nephro, on HD 2/5 OSA: used CPAP many years ago will qualify him for home oxygen night time. He will cont to follow-up with his pulmonary/PCP to discuss about sleep apnea evaluation again reports he had lost weight and did not need his CPAP machine but that has been years ago and no longer has machine> Per case manager will need overnight pulse oximetry and RT consulted Hypertension: BP is elevated at times, cardiology uptitrating beta-blocker.   Anemia of chronic renal disease: Hemoglobin is stable.  Monitor Secondary hyperparathyroidism: Monitor calcium and Phos Class ii Obesity:Patient's Body mass index is 38.21 kg/m. : Will benefit with PCP follow-up, weight loss  healthy lifestyle and outpatient sleep evaluation.   DVT prophylaxis: heparin injection 5,000 Units Start: 08/20/22 0600 Code Status:   Code Status: Full Code Family Communication: plan of care discussed with patient  at bedside. Patient status is: Inpatient because of ACS, managing overnight pulse oximetry qualify for home oxygen Level of care: Telemetry Cardiac   Dispo: The patient is from: home            Anticipated disposition: tomorrow once home oxygen set up  Objective: Vitals last 24 hrs: Vitals:   08/22/22 0435 08/22/22 0747 08/22/22 0948 08/22/22 1145  BP: (!) 141/106 (!) 152/119 130/89 (!) 123/92  Pulse: (!) 106 (!) 111  96 (!) 103  Resp: 13 19 20 19   Temp: 98.1 F (36.7 C) 98.7 F (37.1 C) 98.2 F (36.8 C) 99.2 F (37.3 C)  TempSrc: Oral Oral Oral Oral  SpO2: 96% 99% 100% 98%  Weight:      Height:       Weight change:   Physical Examination: General  exam: alert awake, oriented HEENT:Oral mucosa moist, Ear/Nose WNL grossly Respiratory system: bilaterally clear BS, no use of accessory muscle Cardiovascular system: S1 & S2 +, No JVD. Gastrointestinal system: Abdomen soft,NT,ND, BS+ Nervous System:Alert, awake, moving extremities. Extremities: LE edema ne,distal peripheral pulses palpable.  Left groin with dressing in place Skin: No rashes,no icterus. MSK: Normal muscle bulk,tone, power  Medications reviewed:  Scheduled Meds:  aspirin  81 mg Oral Daily   aspirin EC  81 mg Oral Daily   atorvastatin  80 mg Oral Daily   calcium acetate  1,334 mg Oral BID BM   calcium acetate  2,668 mg Oral TID with meals   heparin  5,000 Units Subcutaneous Q8H   heparin sodium (porcine)  1,000 Units Intracatheter Once   hydrALAZINE  10 mg Oral Q8H   losartan  25 mg Oral QHS   [START ON 08/23/2022] metoprolol succinate  50 mg Oral Daily   multivitamin  1 tablet Oral QPM   sodium chloride flush  3 mL Intravenous Q12H   ticagrelor  90 mg Oral BID   traZODone  100 mg Oral Once   Continuous Infusions:  sodium chloride      Diet Order             Diet renal/carb modified with fluid restriction Diet-HS Snack? Nothing; Fluid restriction: 1200 mL Fluid; Room service appropriate? Yes; Fluid consistency: Thin  Diet effective now                  Intake/Output Summary (Last 24 hours) at 08/22/2022 1341 Last data filed at 08/21/2022 2100 Gross per 24 hour  Intake 365.55 ml  Output 1500 ml  Net -1134.45 ml   Net IO Since Admission: -534.45 mL [08/22/22 1341]  Wt Readings from Last 3 Encounters:  08/16/22 112.1 kg  12/24/20 113.8 kg  12/01/20 112.5 kg     Unresulted Labs (From admission, onward)     Start     Ordered   08/22/22 0500  Lipoprotein A (LPA)  Tomorrow morning,   R       Question:  Specimen collection method  Answer:  Lab=Lab collect   08/21/22 1139          Data Reviewed: I have personally reviewed following labs and imaging  studies CBC: Recent Labs  Lab 08/16/22 2043 08/17/22 0458 08/19/22 2258 08/21/22 0138 08/22/22 0246  WBC 8.0 7.3 9.8 7.9 8.2  HGB 13.0 12.8* 11.8* 10.5* 10.3*  HCT 41.1 39.5 38.1* 33.6* 31.7*  MCV 94.3 94.5 97.9 95.5 93.8  PLT 344 326 299 296 211   Basic Metabolic Panel: Recent Labs  Lab 08/16/22 0205 08/16/22 0301 08/17/22 0458 08/19/22 2258 08/21/22 0138 08/22/22 0246  NA  --  136 135 137 134* 135  K  --  4.2 4.3 4.0 4.3 4.3  CL  --  97* 96* 99 97* 97*  CO2  --  23 26 22  19* 23  GLUCOSE  --  105* 98 106* 95 88  BUN  --  63* 38* 61* 86* 45*  CREATININE  --  10.83* 8.32* 11.08* 14.50* 9.13*  CALCIUM  --  9.5 9.0 8.8* 8.8* 9.2  MG 2.6*  --   --   --  2.4 2.0   Recent Labs  Lab 08/19/22 2303 08/20/22 0745  GLUCAP 107* 87  No results found for this or any previous visit (from the past 240 hour(s)).  Antimicrobials: Anti-infectives (From admission, onward)    Start     Dose/Rate Route Frequency Ordered Stop   08/21/22 1056  ceFAZolin (ANCEF) IVPB 2 g/50 mL premix        over 30 Minutes  Continuous PRN 08/21/22 1057 08/21/22 1127     Radiology Studies: CARDIAC CATHETERIZATION  Addendum Date: 08/22/2022   Coronary intervention and placement of right external iliac artery covered stent 08/21/22: Successful PTCA and stenting of the codominant RCA ulcerated 90% tandem stenosis in the distal RCA and PDA with implantation of 1 long 3.0 x 48 mm Synergy XD DES deployed at 16 atmospheric pressure.  Stenosis reduced from 90% to 0% with TIMI-3 to TIMI-3 flow. Successful closure of the right external iliac artery arteriotomy site with implantation of a Viabahn 8.0 x 29 mm covered stent, postdilated with a 10 x 20 mm balloon.  Complete hemostasis obtained with appropriate vessel size matching. Recommendation: Patient will need aggressive risk factor modification.  He will need dual antiplatelet therapy for at least 1 year in view of ACS.  From vascular standpoint, DAPT for 3 months is  adequate. Total contrast used 120 mL.  Result Date: 08/22/2022 Images from the original result were not included. Coronary intervention and placement of right external iliac artery covered stent 08/21/22: Successful PTCA and stenting of the codominant RCA ulcerated 90% tandem stenosis in the distal RCA and PDA with implantation of 1 long 3.0 x 48 mm Synergy XD DES deployed at 16 atmospheric pressure.  Stenosis reduced from 90% to 0% with TIMI-3 to TIMI-3 flow. Successful closure of the right external iliac artery arteriotomy site with implantation of a Viabahn 8.0 x 29 mm covered stent, postdilated with a 10 x 20 mm balloon.  Complete hemostasis obtained with appropriate vessel size matching. Recommendation: Patient will need aggressive risk factor modification.  He will need dual antiplatelet therapy for at least 1 year in view of ACS.  From vascular standpoint, DAPT for 3 months is adequate. Total contrast used 1 120 mL.   PERIPHERAL VASCULAR CATHETERIZATION  Addendum Date: 08/22/2022   Coronary intervention and placement of right external iliac artery covered stent 08/21/22: Successful PTCA and stenting of the codominant RCA ulcerated 90% tandem stenosis in the distal RCA and PDA with implantation of 1 long 3.0 x 48 mm Synergy XD DES deployed at 16 atmospheric pressure.  Stenosis reduced from 90% to 0% with TIMI-3 to TIMI-3 flow. Successful closure of the right external iliac artery arteriotomy site with implantation of a Viabahn 8.0 x 29 mm covered stent, postdilated with a 10 x 20 mm balloon.  Complete hemostasis obtained with appropriate vessel size matching. Recommendation: Patient will need aggressive risk factor modification.  He will need dual antiplatelet therapy for at least 1 year in view of ACS.  From vascular standpoint, DAPT for 3 months is adequate. Total contrast used 120 mL.  Result Date: 08/22/2022 Images from the original result were not included. Coronary intervention and placement of  right external iliac artery covered stent 08/21/22: Successful PTCA and stenting of the codominant RCA ulcerated 90% tandem stenosis in the distal RCA and PDA with implantation of 1 long 3.0 x 48 mm Synergy XD DES deployed at  16 atmospheric pressure.  Stenosis reduced from 90% to 0% with TIMI-3 to TIMI-3 flow. Successful closure of the right external iliac artery arteriotomy site with implantation of a Viabahn 8.0 x 29 mm covered stent, postdilated with a 10 x 20 mm balloon.  Complete hemostasis obtained with appropriate vessel size matching. Recommendation: Patient will need aggressive risk factor modification.  He will need dual antiplatelet therapy for at least 1 year in view of ACS.  From vascular standpoint, DAPT for 3 months is adequate. Total contrast used 1 120 mL.   CARDIAC CATHETERIZATION  Result Date: 08/21/2022   Prox LAD lesion is 50% stenosed.   Dist Cx lesion is 50% stenosed.   Prox RCA lesion is 40% stenosed.   Mid RCA lesion is 50% stenosed.   Dist RCA lesion is 90% stenosed.   RPDA lesion is 99% stenosed.   There is moderate left ventricular systolic dysfunction.   LV end diastolic pressure is moderately elevated.   The left ventricular ejection fraction is 35-45% by visual estimate. RCA stenting/angioplasty by Dr. Einar Gip     LOS: 2 days   Antonieta Pert, MD [facility]  08/22/2022, 1:41 PM

## 2022-08-22 NOTE — Progress Notes (Signed)
Per notes, pt may be stable for d/c tomorrow. Contacted [facility] to inquire about pt's chair time. Pt goes MWF 7:15 arrival for 7:30 chair time. Inquired if there is a 2nd shift appt available should pt be stable for d/c tomorrow am. At this time, clinic does not have a 2nd shift appt available tomorrow. Will assist as needed.   Melven Sartorius Renal Navigator [phone]

## 2022-08-22 NOTE — Hospital Course (Addendum)
52 year old male with multiple medical history including ESRD on HD MWF, OSA, hypertension, anemia of chronic renal disease, secondary hyperparathyroidism,  recently admitted 1/31-2/1 for NSTEMI.  Preferred outpt work up for [facility] so discharged home.  He now returns with complaints of left-sided chest pain as well as elevated blood pressure readings. He was seen in the ED, underwent cardiac evaluation, found to have ACS with recent non-ST MI.  Underwent cardiac cath with stent to RCA 2/5 and right external iliac stent. Post cath has been doing well underwent dialysis.  Currently chest pain-free no leg pain, currently has cleared the patient for discharge home.  Noted to have hypoxia during sleep underwent overnight pulse oximetry and at this time he qualifies for home oxygen which is being set up. He is stable for discharge to home

## 2022-08-22 NOTE — Plan of Care (Signed)
  Problem: Education: Goal: Knowledge of General Education information will improve Description: Including pain rating scale, medication(s)/side effects and non-pharmacologic comfort measures Outcome: Progressing   Problem: Health Behavior/Discharge Planning: Goal: Ability to manage health-related needs will improve Outcome: Progressing   Problem: Clinical Measurements: Goal: Respiratory complications will improve Outcome: Progressing   Problem: Clinical Measurements: Goal: Cardiovascular complication will be avoided Outcome: Progressing   Problem: Pain Managment: Goal: General experience of comfort will improve Outcome: Progressing   Problem: Safety: Goal: Ability to remain free from injury will improve Outcome: Progressing   Problem: Skin Integrity: Goal: Risk for impaired skin integrity will decrease Outcome: Progressing   Problem: Cardiovascular: Goal: Ability to achieve and maintain adequate cardiovascular perfusion will improve Outcome: Progressing Goal: Vascular access site(s) Level 0-1 will be maintained Outcome: Progressing   

## 2022-08-22 NOTE — Progress Notes (Signed)
Subjective:  Doing well denies any chest pain or shortness of breath.  Ambulated earlier this morning with cardiac rehab without any problems.  Tolerated hemodialysis yesterday.  Objective:  Vital Signs in the last 24 hours: Temp:  [97.5 F (36.4 C)-99.2 F (37.3 C)] 99.2 F (37.3 C) (02/06 1145) Pulse Rate:  [73-111] 103 (02/06 1145) Resp:  [13-24] 19 (02/06 1145) BP: (87-191)/(48-160) 123/92 (02/06 1145) SpO2:  [96 %-100 %] 98 % (02/06 1145)  Intake/Output from previous day: 02/05 0701 - 02/06 0700 In: 725.6 [P.O.:600; I.V.:75.6; IV Piggyback:50] Out: 1500  Intake/Output from this shift: No intake/output data recorded.  Physical Exam: Neck: no adenopathy, no carotid bruit, no JVD, and supple, symmetrical, trachea midline Lungs: Decreased breath sound at bases air entry has improved Heart: regular rate and rhythm, S1, S2 normal, and 2/6 systolic murmur noted no S3 gallop no pericardial rub Abdomen: soft, non-tender; bowel sounds normal; no masses,  no organomegaly Extremities: extremities normal, atraumatic, no cyanosis or edema and multiple joints stable  Lab Results: Recent Labs    08/21/22 0138 08/22/22 0246  WBC 7.9 8.2  HGB 10.5* 10.3*  PLT 296 306   Recent Labs    08/21/22 0138 08/22/22 0246  NA 134* 135  K 4.3 4.3  CL 97* 97*  CO2 19* 23  GLUCOSE 95 88  BUN 86* 45*  CREATININE 14.50* 9.13*   No results for input(s): "TROPONINI" in the last 72 hours.  Invalid input(s): "CK", "MB" Hepatic Function Panel No results for input(s): "PROT", "ALBUMIN", "AST", "ALT", "ALKPHOS", "BILITOT", "BILIDIR", "IBILI" in the last 72 hours. No results for input(s): "CHOL" in the last 72 hours. No results for input(s): "PROTIME" in the last 72 hours.  Imaging: Imaging results have been reviewed and CARDIAC CATHETERIZATION  Addendum Date: 08/22/2022   Coronary intervention and placement of right external iliac artery covered stent 08/21/22: Successful PTCA and stenting of  the codominant RCA ulcerated 90% tandem stenosis in the distal RCA and PDA with implantation of 1 long 3.0 x 48 mm Synergy XD DES deployed at 16 atmospheric pressure.  Stenosis reduced from 90% to 0% with TIMI-3 to TIMI-3 flow. Successful closure of the right external iliac artery arteriotomy site with implantation of a Viabahn 8.0 x 29 mm covered stent, postdilated with a 10 x 20 mm balloon.  Complete hemostasis obtained with appropriate vessel size matching. Recommendation: Patient will need aggressive risk factor modification.  He will need dual antiplatelet therapy for at least 1 year in view of ACS.  From vascular standpoint, DAPT for 3 months is adequate. Total contrast used 120 mL.  Result Date: 08/22/2022 Images from the original result were not included. Coronary intervention and placement of right external iliac artery covered stent 08/21/22: Successful PTCA and stenting of the codominant RCA ulcerated 90% tandem stenosis in the distal RCA and PDA with implantation of 1 long 3.0 x 48 mm Synergy XD DES deployed at 16 atmospheric pressure.  Stenosis reduced from 90% to 0% with TIMI-3 to TIMI-3 flow. Successful closure of the right external iliac artery arteriotomy site with implantation of a Viabahn 8.0 x 29 mm covered stent, postdilated with a 10 x 20 mm balloon.  Complete hemostasis obtained with appropriate vessel size matching. Recommendation: Patient will need aggressive risk factor modification.  He will need dual antiplatelet therapy for at least 1 year in view of ACS.  From vascular standpoint, DAPT for 3 months is adequate. Total contrast used 1 120 mL.   PERIPHERAL VASCULAR  CATHETERIZATION  Addendum Date: 08/22/2022   Coronary intervention and placement of right external iliac artery covered stent 08/21/22: Successful PTCA and stenting of the codominant RCA ulcerated 90% tandem stenosis in the distal RCA and PDA with implantation of 1 long 3.0 x 48 mm Synergy XD DES deployed at 16 atmospheric  pressure.  Stenosis reduced from 90% to 0% with TIMI-3 to TIMI-3 flow. Successful closure of the right external iliac artery arteriotomy site with implantation of a Viabahn 8.0 x 29 mm covered stent, postdilated with a 10 x 20 mm balloon.  Complete hemostasis obtained with appropriate vessel size matching. Recommendation: Patient will need aggressive risk factor modification.  He will need dual antiplatelet therapy for at least 1 year in view of ACS.  From vascular standpoint, DAPT for 3 months is adequate. Total contrast used 120 mL.  Result Date: 08/22/2022 Images from the original result were not included. Coronary intervention and placement of right external iliac artery covered stent 08/21/22: Successful PTCA and stenting of the codominant RCA ulcerated 90% tandem stenosis in the distal RCA and PDA with implantation of 1 long 3.0 x 48 mm Synergy XD DES deployed at 16 atmospheric pressure.  Stenosis reduced from 90% to 0% with TIMI-3 to TIMI-3 flow. Successful closure of the right external iliac artery arteriotomy site with implantation of a Viabahn 8.0 x 29 mm covered stent, postdilated with a 10 x 20 mm balloon.  Complete hemostasis obtained with appropriate vessel size matching. Recommendation: Patient will need aggressive risk factor modification.  He will need dual antiplatelet therapy for at least 1 year in view of ACS.  From vascular standpoint, DAPT for 3 months is adequate. Total contrast used 1 120 mL.   CARDIAC CATHETERIZATION  Result Date: 08/21/2022   Prox LAD lesion is 50% stenosed.   Dist Cx lesion is 50% stenosed.   Prox RCA lesion is 40% stenosed.   Mid RCA lesion is 50% stenosed.   Dist RCA lesion is 90% stenosed.   RPDA lesion is 99% stenosed.   There is moderate left ventricular systolic dysfunction.   LV end diastolic pressure is moderately elevated.   The left ventricular ejection fraction is 35-45% by visual estimate. RCA stenting/angioplasty by Dr. Einar Gip    Cardiac  Studies:  Assessment/Plan:  Acute coronary syndrome status post left cardiac catheterization/PTCA stenting to distal RCA and covered stent to right external iliac Hypertension End-stage renal disease on hemodialysis Mild volume overload GERD Obstructive sleep apnea Anemia of chronic disease Plan Increase Toprol-XL to 50 mg daily Reduce hydralazine to 10 mg every 8 hours in view of tachycardia Continue rest of the medications  Postcardiac cath instructions have been given scheduled for phase 2 cardiac rehab as outpatient okay to discharge from cardiac point of view follow-up with me in 1 week  LOS: 2 days    Charolette Forward 08/22/2022, 12:30 PM

## 2022-08-22 NOTE — Progress Notes (Signed)
Pt amb the hallway this morning w/o CP. Pt was educated on stent card, stent location, Birlinta and ASA use, wt restrictions, no baths/daily wash-ups, s/s of infection, ex guidelines (progressive walking and resistance exercise), s/s to stop exercising, NTG use and calling 911, heart healthy diet, risk factors (smoking), and CRPII. Pt received materials on exercise, diet, and CRPII. Will refer to [facility].    Pt plans on joining CRPII program at [facility].   Jerry [name] 08/22/2022 9:50 AM   [id number]

## 2022-08-22 NOTE — Progress Notes (Signed)
[facility] Progress Note   Subjective:  Seen in room - more awake/back to baseline this AM. Denies CP or dyspnea. Per RN notes - O2 was low while sleeping and he refused to wear Cpap mask. He tells me that was up walking this morning. He is hoping he will be discharged today. From a dialysis standpoint, we had lowered his dry weight significantly when he was here last week - tells me that he thinks that was too low, he thinks EDW should be 113kg range.  Objective Vitals:   08/22/22 0051 08/22/22 0435 08/22/22 0747 08/22/22 0948  BP: (!) 131/102 (!) 141/106 (!) 152/119 130/89  Pulse: 98 (!) 106 (!) 111 96  Resp: 20 13 19 20   Temp: 97.8 F (36.6 C) 98.1 F (36.7 C) 98.7 F (37.1 C) 98.2 F (36.8 C)  TempSrc: Axillary Oral Oral Oral  SpO2: 97% 96% 99% 100%  Weight:      Height:       Physical Exam General: Well appearing, alert/awake, NAD Heart: RRR; no murmur Lungs: CTA anteriorly Abdomen: soft Extremities: no LE edema Dialysis Access:  TDC in L chest  Additional Objective Labs: Basic Metabolic Panel: Recent Labs  Lab 08/19/22 2258 08/21/22 0138 08/22/22 0246  NA 137 134* 135  K 4.0 4.3 4.3  CL 99 97* 97*  CO2 22 19* 23  GLUCOSE 106* 95 88  BUN 61* 86* 45*  CREATININE 11.08* 14.50* 9.13*  CALCIUM 8.8* 8.8* 9.2   CBC: Recent Labs  Lab 08/16/22 2043 08/17/22 0458 08/19/22 2258 08/21/22 0138 08/22/22 0246  WBC 8.0 7.3 9.8 7.9 8.2  HGB 13.0 12.8* 11.8* 10.5* 10.3*  HCT 41.1 39.5 38.1* 33.6* 31.7*  MCV 94.3 94.5 97.9 95.5 93.8  PLT 344 326 299 296 306   Studies/Results: CARDIAC CATHETERIZATION  Result Date: 08/21/2022   Prox LAD lesion is 50% stenosed.   Dist Cx lesion is 50% stenosed.   Prox RCA lesion is 40% stenosed.   Mid RCA lesion is 50% stenosed.   Dist RCA lesion is 90% stenosed.   RPDA lesion is 99% stenosed.   There is moderate left ventricular systolic dysfunction.   LV end diastolic pressure is moderately elevated.   The left  ventricular ejection fraction is 35-45% by visual estimate. RCA stenting/angioplasty by Dr. Einar Gip    Medications:  sodium chloride      aspirin  81 mg Oral Daily   aspirin EC  81 mg Oral Daily   atorvastatin  80 mg Oral Daily   calcium acetate  1,334 mg Oral BID BM   calcium acetate  2,668 mg Oral TID with meals   heparin  5,000 Units Subcutaneous Q8H   heparin sodium (porcine)  1,000 Units Intracatheter Once   hydrALAZINE  25 mg Oral Q8H   losartan  25 mg Oral QHS   metoprolol succinate  12.5 mg Oral Daily   multivitamin  1 tablet Oral QPM   sodium chloride flush  3 mL Intravenous Q12H   ticagrelor  90 mg Oral BID   traZODone  100 mg Oral Once    Dialysis Orders: SW MWF  4h 27min 2/2 bath EDW 110kg 400/500 no UFP Hectorol 64mcg IVP TIW No Heparin, no ESA   Assessment/Plan: Chest pain: + recent NSTEMI, new HFrEF 45-50% and small WMA. S/p LHC this AM - s/p DES to distal RCA as well as covered stent to R external iliac. Needs DAPT (ticagrelor + ASA) x 1 year per notes. ESRD:  On MWF schedule - next HD tomorrow, either here or as outpatient. EDW lowered to 110kg with last admit - he now thinks that is too low and requesting EDW adjustment to 113kg - will change. HTN/volume: BP variable, continue home BB + ARB + hydralazine. Anemia:  Hgb 10.3, no ESA for now. Secondary hyperparathyroidism: Ca/Phos ok - continue home meds. HFrEF: EF 45-50%  [name], PA-C 08/22/2022, 10:33 AM  [facility]

## 2022-08-23 ENCOUNTER — Other Ambulatory Visit (HOSPITAL_COMMUNITY): Payer: Self-pay

## 2022-08-23 DIAGNOSIS — I214 Non-ST elevation (NSTEMI) myocardial infarction: Secondary | ICD-10-CM

## 2022-08-23 LAB — RENAL FUNCTION PANEL
Albumin: 3.5 g/dL (ref 3.5–5.0)
Anion gap: 17 — ABNORMAL HIGH (ref 5–15)
BUN: 78 mg/dL — ABNORMAL HIGH (ref 6–20)
CO2: 24 mmol/L (ref 22–32)
Calcium: 9.1 mg/dL (ref 8.9–10.3)
Chloride: 95 mmol/L — ABNORMAL LOW (ref 98–111)
Creatinine, Ser: 12.67 mg/dL — ABNORMAL HIGH (ref 0.61–1.24)
GFR, Estimated: 4 mL/min — ABNORMAL LOW (ref >60)
Glucose, Bld: 92 mg/dL (ref 70–99)
Phosphorus: 7.5 mg/dL — ABNORMAL HIGH (ref 2.5–4.6)
Potassium: 5.2 mmol/L — ABNORMAL HIGH (ref 3.5–5.1)
Sodium: 136 mmol/L (ref 135–145)

## 2022-08-23 LAB — CBC
HCT: 30.1 % — ABNORMAL LOW (ref 39.0–52.0)
Hemoglobin: 9.3 g/dL — ABNORMAL LOW (ref 13.0–17.0)
MCH: 30.1 pg (ref 26.0–34.0)
MCHC: 30.9 g/dL (ref 30.0–36.0)
MCV: 97.4 fL (ref 80.0–100.0)
Platelets: 269 10*3/uL (ref 150–400)
RBC: 3.09 MIL/uL — ABNORMAL LOW (ref 4.22–5.81)
RDW: 15.1 % (ref 11.5–15.5)
WBC: 9.5 10*3/uL (ref 4.0–10.5)
nRBC: 0 % (ref 0.0–0.2)

## 2022-08-23 LAB — LIPOPROTEIN A (LPA): Lipoprotein (a): 29.6 nmol/L (ref <75.0)

## 2022-08-23 MED ORDER — TICAGRELOR 90 MG PO TABS
90.0000 mg | ORAL_TABLET | Freq: Two times a day (BID) | ORAL | 0 refills | Status: AC
  Filled 2022-08-23: qty 60, 30d supply, fill #0

## 2022-08-23 MED ORDER — HEPARIN SODIUM (PORCINE) 1000 UNIT/ML IJ SOLN
INTRAMUSCULAR | Status: AC
  Filled 2022-08-23: qty 4

## 2022-08-23 MED ORDER — METOPROLOL SUCCINATE ER 50 MG PO TB24
50.0000 mg | ORAL_TABLET | Freq: Every day | ORAL | 0 refills | Status: DC
  Filled 2022-08-23: qty 30, 30d supply, fill #0

## 2022-08-23 MED ORDER — HYDRALAZINE HCL 10 MG PO TABS
10.0000 mg | ORAL_TABLET | Freq: Three times a day (TID) | ORAL | 0 refills | Status: DC
  Filled 2022-08-23: qty 90, 30d supply, fill #0

## 2022-08-23 MED ORDER — ATORVASTATIN CALCIUM 80 MG PO TABS
80.0000 mg | ORAL_TABLET | Freq: Every day | ORAL | 0 refills | Status: DC
  Filled 2022-08-23: qty 30, 30d supply, fill #0

## 2022-08-23 MED FILL — Cefazolin Sodium For Inj 2 GM: INTRAMUSCULAR | Qty: 2 | Status: AC

## 2022-08-23 MED FILL — Labetalol HCl IV Soln Prefilled Syringe 20 MG/4ML (5 MG/ML): INTRAVENOUS | Qty: 4 | Status: AC

## 2022-08-23 NOTE — Progress Notes (Addendum)
Around 2330H, Asleep. Sp02 dropped to less tahn 88%, sustaining, on room air. Hooked to oxygen at 2 lpm via [place]. Sp02 = >93%.Plan of care ongoing.   Sp02 = >92% the whole night with oxygen at 2 lpm.

## 2022-08-23 NOTE — Progress Notes (Signed)
TOC meds delivered to bedside.

## 2022-08-23 NOTE — Progress Notes (Signed)
D/C order noted. Contacted [facility] to advise clinic of pt's d/c today and that pt should resume care on Friday.   Melven Sartorius Renal Navigator [phone]

## 2022-08-23 NOTE — Care Management Important Message (Signed)
Important Message  Patient Details  Name: Jerry [name] MRN: [id number] Date of Birth: Nov 07, 1970   Medicare Important Message Given:  Yes     Shelda Altes 08/23/2022, 12:35 PM

## 2022-08-23 NOTE — Progress Notes (Signed)
   08/23/22 1108  Vitals  Temp 98.9 F (37.2 C)  Pulse Rate (!) 59  Resp 20  BP 107/85  SpO2 99 %  O2 Device Room Air  Weight 113.8 kg  Type of Weight Post-Dialysis  Oxygen Therapy  Patient Activity (if Appropriate) In bed  Pulse Oximetry Type Continuous  Post Treatment  Dialyzer Clearance Lightly streaked  Duration of HD Treatment -hour(s) 3 hour(s) (per critical staffing per md)  Hemodialysis Intake (mL) 0 mL  Liters Processed 72  Fluid Removed (mL) 2500 mL   Received patient in bed to unit.  Alert and oriented.  Informed consent signed and in chart.   TX duration3.0--per md order:  Patient tolerated well.  Transported back to the room  Alert, without acute distress.  Hand-off given to patient's nurse.   Access used: [facility] Access issues: no complications  Total UF removed: 2500 Medication(s) given: none    [facility]

## 2022-08-23 NOTE — TOC Initial Note (Addendum)
Transition of Care [facility]) - Initial/Assessment Note    Patient Details  Name: Jerry [name] MRN: [id number] Date of Birth: 08-Jul-1971  Transition of [facility]) CM/SW Contact:    Bethena Roys, RN Phone Number: 08/23/2022, 1:28 PM  Clinical Narrative: Case Manager spoke with patient regarding DME oxygen for night time use. Patient is agreeable to [employer]. Case Manager submitted referral to [employer] and faxed overnight pulse oximetry results to the agency. DME should be delivered to the room prior to transition home.                   1411 08-23-22 [employer] has the information for DME Oxygen and they will call the patient for home delivery tonight.  Expected Discharge Plan: Home/Self Care Barriers to Discharge: No Barriers Identified   Patient Goals and CMS Choice Patient states their goals for this hospitalization and ongoing recovery are:: to return home.   Choice offered to / list presented to : NA    Expected Discharge Plan and Services In-house Referral: NA Discharge Planning Services: CM Consult   Living arrangements for the past 2 months: Single Family Home Expected Discharge Date: 08/23/22               DME Arranged: Oxygen DME Agency: [employer] Date DME Agency Contacted: 08/23/22 Time DME Agency Contacted: 88 Representative spoke with at DME Agency: Brenton Grills HH Arranged: NA  Prior Living Arrangements/Services Living arrangements for the past 2 months: [place] Lives with:: Self Patient language and need for interpreter reviewed:: Yes        Need for Family Participation in Patient Care: No (Comment) Care giver support system in place?: No (comment)   Criminal Activity/Legal Involvement Pertinent to Current Situation/Hospitalization: No - Comment as needed  Activities of Daily Living Home Assistive Devices/Equipment: None ADL Screening (condition at time of admission) Patient's cognitive ability adequate to safely complete daily  activities?: Yes Is the patient deaf or have difficulty hearing?: No Does the patient have difficulty seeing, even when wearing glasses/contacts?: No Does the patient have difficulty concentrating, remembering, or making decisions?: No Patient able to express need for assistance with ADLs?: Yes Does the patient have difficulty dressing or bathing?: No Independently performs ADLs?: Yes (appropriate for developmental age) Does the patient have difficulty walking or climbing stairs?: Yes (SHOB) Weakness of Legs: None Weakness of Arms/Hands: None  Permission Sought/Granted Permission sought to share information with : Case Manager, Customer service manager, Family Supports Permission granted to share information with : Yes, Verbal Permission Granted     Permission granted to share info w AGENCY: [employer]        Emotional Assessment Appearance:: Appears stated age Attitude/Demeanor/Rapport: Engaged Affect (typically observed): Appropriate Orientation: : Oriented to Situation, Oriented to  Time, Oriented to Place, Oriented to Self Alcohol / Substance Use: Not Applicable Psych Involvement: No (comment)  Admission diagnosis:  Hypertensive urgency [I16.0] Chest pain [R07.9] Patient Active Problem List   Diagnosis Date Noted   Coronary artery disease involving native coronary artery of native heart with unstable angina pectoris ([place]) 08/21/2022   Iliac artery bleed, right 08/21/2022   Chest pain 08/20/2022   Non-ST elevation (NSTEMI) myocardial infarction ([place]) 08/16/2022   Hypertensive emergency 08/12/2022   Hypocalcemia 09/24/2020   Status post surgery 05/23/2020   ESRD (end stage renal disease) on dialysis ([place]) 05/23/2020   Hemorrhage due to vascular prosthetic devices, implants and grafts, initial encounter ([place]) 05/22/2020   Allergy, unspecified, initial encounter 03/10/2020   Anaphylactic  shock, unspecified, initial encounter 03/10/2020   Methicillin susceptible  Staphylococcus aureus infection, unspecified site 02/05/2020   Coagulation defect, unspecified ([place]) 02/04/2020   Cellulitis 02/02/2020   Right arm cellulitis 02/02/2020   Fluid overload, unspecified 06/13/2019   Headache, unspecified 05/16/2019   Shortness of breath 05/09/2019   Urinary tract infection, site not specified 04/25/2019   Encounter for immunization 04/09/2019   Bacterial infection, unspecified 01/17/2019   Encounter for removal of sutures 11/14/2018   Anemia in chronic kidney disease 02/05/2018   End stage renal disease ([place]) 02/05/2018   Iron deficiency anemia, unspecified 02/05/2018   Other secondary hypertension 02/05/2018   Secondary hyperparathyroidism of renal origin ([place]) 02/05/2018   ARF (acute renal failure) ([place]) 01/29/2018   Hypertensive urgency 01/29/2018   Anemia 01/29/2018   Other and unspecified anterior pituitary hyperfunction 06/14/2009   Hypertrophy of breast 06/03/2009   Sperm absent 06/03/2009   PCP:  Corliss Parish, MD Pharmacy:   [facility] [place], [address] [place] [address] Phone: [phone] Fax: [phone]  [facility] [address] [address] Phone: [phone] Fax: [phone]  Social Determinants of Health (SDOH) Social History: SDOH Screenings   Food Insecurity: No Food Insecurity (08/20/2022)  Housing: Low Risk  (08/20/2022)  Transportation Needs: No Transportation Needs (08/20/2022)  Utilities: Not At Risk (08/20/2022)  Depression (PHQ2-9): Low Risk  (08/20/2022)  Financial Resource Strain: Low Risk  (08/20/2022)  Physical Activity: Inactive (08/20/2022)  Social Connections: Moderately Integrated (08/20/2022)  Stress: No Stress Concern Present (08/20/2022)  Tobacco Use: Medium Risk (08/22/2022)   Readmission Risk Interventions     No data to display

## 2022-08-23 NOTE — Progress Notes (Signed)
   [facility] Progress Note   Subjective:  Still slightly hypoxic at night without O2 - being set up for home O2 and likely will need outpatient sleep study arranged. Denies CP/dyspnea. On HD now - 2.5L UFG and tolerating.  Objective Vitals:   08/22/22 2339 08/23/22 0516 08/23/22 0749 08/23/22 0805  BP: (!) 139/106 (!) 161/88 127/86 (!) 158/131  Pulse: 98 88 82 91  Resp: 20 18 14 16   Temp: 98.5 F (36.9 C) 97.8 F (36.6 C) 98.2 F (36.8 C)   TempSrc: Oral Oral    SpO2: 97% 100% 96% 95%  Weight:   116.3 kg   Height:       Physical Exam General: Well appearing, alert/awake, NAD Heart: RRR; no murmur Lungs: CTA anteriorly Abdomen: soft Extremities: no LE edema Dialysis Access:  TDC in L chest  Additional Objective Labs: Basic Metabolic Panel: Recent Labs  Lab 08/19/22 2258 08/21/22 0138 08/22/22 0246  NA 137 134* 135  K 4.0 4.3 4.3  CL 99 97* 97*  CO2 22 19* 23  GLUCOSE 106* 95 88  BUN 61* 86* 45*  CREATININE 11.08* 14.50* 9.13*  CALCIUM 8.8* 8.8* 9.2   CBC: Recent Labs  Lab 08/16/22 2043 08/17/22 0458 08/19/22 2258 08/21/22 0138 08/22/22 0246  WBC 8.0 7.3 9.8 7.9 8.2  HGB 13.0 12.8* 11.8* 10.5* 10.3*  HCT 41.1 39.5 38.1* 33.6* 31.7*  MCV 94.3 94.5 97.9 95.5 93.8  PLT 344 326 299 296 306   Medications:  sodium chloride      aspirin  81 mg Oral Daily   aspirin EC  81 mg Oral Daily   atorvastatin  80 mg Oral Daily   calcium acetate  1,334 mg Oral BID BM   calcium acetate  2,668 mg Oral TID with meals   heparin  5,000 Units Subcutaneous Q8H   heparin sodium (porcine)  1,000 Units Intracatheter Once   hydrALAZINE  10 mg Oral Q8H   losartan  25 mg Oral QHS   metoprolol succinate  50 mg Oral Daily   multivitamin  1 tablet Oral QPM   sodium chloride flush  3 mL Intravenous Q12H   ticagrelor  90 mg Oral BID   traZODone  100 mg Oral Once    Dialysis Orders: SW MWF  4h 46min 2/2 bath EDW 110kg 400/500 no UFP Hectorol 5mcg IVP TIW No  Heparin, no ESA   Assessment/Plan: ACS/Chest pain: + recent NSTEMI, new HFrEF 45-50% and small WMA. S/p LHC this AM - s/p DES to distal RCA as well as covered stent to R external iliac. Needs DAPT (ticagrelor + ASA) x 1 year per notes. ESRD: On MWF schedule - HD now, 2.5L UFG. EDW lowered to 110kg with last admit - he now thinks that is too low and requesting EDW adjustment to 113kg - will change. HTN/volume: BP variable, continue home BB + ARB + hydralazine. Anemia:  Hgb 10.3, no ESA for now. Secondary hyperparathyroidism: Ca/Phos ok - continue home meds. HFrEF: EF 45-50% Presumed sleep apnea: Remote Dx - used cpap years ago, then lost weight and not needed. Now with hypoxic episodes at night. Does not tolerate full face cpap. Sounds like getting home O2 to use nightly for now - will need updated sleep study arranged as outpatient  [name], PA-C 08/23/2022, 8:21 AM  [employer]

## 2022-08-23 NOTE — Progress Notes (Signed)
Subjective:  Patient denies any chest pain or shortness of breath tolerated hemodialysis today eager to go home.  Still remains tachycardic  Objective:  Vital Signs in the last 24 hours: Temp:  [97.8 F (36.6 C)-99 F (37.2 C)] 98 F (36.7 C) (02/07 1152) Pulse Rate:  [52-117] 117 (02/07 1152) Resp:  [13-23] 13 (02/07 1152) BP: (102-161)/(67-131) 108/67 (02/07 1152) SpO2:  [95 %-100 %] 99 % (02/07 1152) Weight:  [113.8 kg-116.3 kg] 113.8 kg (02/07 1108)  Intake/Output from previous day: 02/06 0701 - 02/07 0700 In: 240 [P.O.:240] Out: 0  Intake/Output from this shift: Total I/O In: -  Out: 2500 [Other:2500]  Physical Exam: Exam unchanged  Lab Results: Recent Labs    08/22/22 0246 08/23/22 0806  WBC 8.2 9.5  HGB 10.3* 9.3*  PLT 306 269   Recent Labs    08/22/22 0246 08/23/22 0806  NA 135 136  K 4.3 5.2*  CL 97* 95*  CO2 23 24  GLUCOSE 88 92  BUN 45* 78*  CREATININE 9.13* 12.67*   No results for input(s): "TROPONINI" in the last 72 hours.  Invalid input(s): "CK", "MB" Hepatic Function Panel Recent Labs    08/23/22 0806  ALBUMIN 3.5   No results for input(s): "CHOL" in the last 72 hours. No results for input(s): "PROTIME" in the last 72 hours.  Imaging: Imaging results have been reviewed and No results found.  Cardiac Studies:  Assessment/Plan:  Acute coronary syndrome status post left cardiac catheterization/PTCA stenting to distal RCA and covered stent to right external iliac Hypertension End-stage renal disease on hemodialysis Mild volume overload GERD Obstructive sleep apnea Anemia of chronic disease Plan DC hydralazine in view of tachycardia Will uptitrate beta-blockers as outpatient as tolerated Follow-up with me in 1 week   LOS: 3 days    Charolette Forward 08/23/2022, 12:47 PM

## 2022-08-23 NOTE — Discharge Summary (Addendum)
Physician Discharge Summary  Jerry [name] [id number] DOB: 1970-12-15 DOA: 08/19/2022  PCP: Corliss Parish, MD  Admit date: 08/19/2022 Discharge date: 08/30/2022 Recommendations for Outpatient Follow-up:  Follow up with PCP in 1 weeks-call for appointment Please obtain BMP/CBC in one week Continue nocturnal oxygen for sleep apnea Follow-up with PCP for CPAP test/machine.  Discharge Dispo: home Discharge Condition: Stable Code Status:   Code Status: Prior Diet recommendation:  Diet Order     None        Brief/Interim Summary: 52 year old male with multiple medical history including ESRD on HD MWF, OSA, hypertension, anemia of chronic renal disease, secondary hyperparathyroidism,  recently admitted 1/31-2/1 for NSTEMI.  Preferred outpt work up for [name] so discharged home.  He now returns with complaints of left-sided chest pain as well as elevated blood pressure readings. He was seen in the ED, underwent cardiac evaluation, found to have ACS with recent non-ST MI.  Underwent cardiac cath with stent to RCA 2/5 and right external iliac stent. Post cath has been doing well underwent dialysis.  Currently chest pain-free no leg pain, currently has cleared the patient for discharge home.  Noted to have hypoxia during sleep underwent overnight pulse oximetry and at this time he qualifies for home oxygen which is being set up. He is stable for discharge to home   Discharge Diagnoses:  Principal Problem:   Chest pain Active Problems:   End stage renal disease (HCC)   Hypertensive urgency   Non-ST elevation (NSTEMI) myocardial infarction [facility])   Coronary artery disease involving native coronary artery of native heart with unstable angina pectoris ([place])   ACS w/ recent NSTEMI: S/P stent to distal RCA and covered stent to the right external iliac: Meds adjusted by cardiology and okay for discharge home today.  He will continue Lipitor 80 mg, increased dose of beta-blocker aspirin  Brilinta and hydralazine.  Outpatient follow-up with cardiology advised.     ESRD on HD [id number] managed by  nephro, on HD 2/7 OSA: used CPAP many years ago. He will cont to follow-up with his pulmonary/PCP to discuss about sleep apnea evaluation again reports he had lost weight and did not need his CPAP machine but that has been years ago and no longer has machine> underwent overnight pulse oximetry his oxygen was saturating from 80 to 87% during sleep lowest was 78% in deep sleep.  He will go home on 2 L nasal cannula oxygen during sleep  Hypertension: BP meds has been ARB jested continue as above  Anemia of chronic renal disease: Hemoglobin is stable.  Monitor Secondary hyperparathyroidism: Monitor calcium and Phos Class ii Obesity:Patient's Body mass index is 38.21 kg/m. : Will benefit with PCP follow-up, weight loss  healthy lifestyle and outpatient sleep evaluation.  Consults: Cardiology  Nephrology Subjective: Aaox3, getting HD feels well and wants to go home today  Discharge Exam: Vitals:   08/23/22 1152 08/23/22 1423  BP: 108/67 (!) 126/91  Pulse: (!) 117 (!) 108  Resp: 13 14  Temp: 98 F (36.7 C)   SpO2: 99% 98%   General: Pt is alert, awake, not in acute distress Cardiovascular: RRR, S1/S2 +, no rubs, no gallops Respiratory: CTA bilaterally, no wheezing, no rhonchi Abdominal: Soft, NT, ND, bowel sounds + Extremities: no edema, no cyanosis  Discharge Instructions  Discharge Instructions     Amb Referral to Cardiac Rehabilitation   Complete by: As directed    Diagnosis: Coronary Stents   After initial evaluation and assessments completed: Virtual Based Care  may be provided alone or in conjunction with Phase 2 Cardiac Rehab based on patient barriers.: Yes   Intensive Cardiac Rehabilitation (ICR) [place] location only OR Traditional Cardiac Rehabilitation (TCR) *If criteria for ICR are not met will enroll in TCR [facility] only): Yes   Discharge instructions   Complete by: As  directed    Please call call MD or return to ER for similar or worsening recurring problem that brought you to hospital or if any fever,nausea/vomiting,abdominal pain, uncontrolled pain, chest pain,  shortness of breath or any other alarming symptoms.  Please follow-up your doctor as instructed in a week time and call the office for appointment.  Please avoid alcohol, smoking, or any other illicit substance and maintain healthy habits including taking your regular medications as prescribed.  You were cared for by a hospitalist during your hospital stay. If you have any questions about your discharge medications or the care you received while you were in the hospital after you are discharged, you can call the unit and ask to speak with the hospitalist on call if the hospitalist that took care of you is not available.  Once you are discharged, your primary care physician will handle any further medical issues. Please note that NO REFILLS for any discharge medications will be authorized once you are discharged, as it is imperative that you return to your primary care physician (or establish a relationship with a primary care physician if you do not have one) for your aftercare needs so that they can reassess your need for medications and monitor your lab values   Increase activity slowly   Complete by: As directed       Allergies as of 08/23/2022       Reactions   Alcohol Rash   Causes skin irritation   Chlorhexidine Itching   Burns skin   Other Other (See Comments)   Local anesthesia (quickly metabolizes)   Tape Other (See Comments)   Adhesive tape (irritates skin)        Medication List     TAKE these medications    acetaminophen 500 MG tablet Commonly known as: TYLENOL Take 1,000 mg by mouth every 6 (six) hours as needed for mild pain.   aspirin EC 81 MG tablet Take 1 tablet (81 mg total) by mouth daily. Swallow whole.   atorvastatin 80 MG tablet Commonly known as:  LIPITOR Take 1 tablet (80 mg total) by mouth daily. What changed:  medication strength how much to take   Brilinta 90 MG Tabs tablet Generic drug: ticagrelor Take 1 tablet (90 mg total) by mouth 2 (two) times daily.   calcium acetate 667 MG capsule Commonly known as: PHOSLO Take 1,334-2,668 mg by mouth with breakfast, with lunch, and with evening meal. Take 4 capsules (2,668 mg) by mouth with each meal & take 2 capsules (1,334 mg) by mouth with snacks   hydrALAZINE 10 MG tablet Commonly known as: APRESOLINE Take 1 tablet (10 mg total) by mouth every 8 (eight) hours.   losartan 25 MG tablet Commonly known as: COZAAR Take 25 mg by mouth at bedtime.   metoprolol succinate 50 MG 24 hr tablet Commonly known as: TOPROL-XL Take 1 tablet (50 mg total) by mouth daily. Take with or immediately following a meal. What changed:  medication strength how much to take additional instructions   multivitamin Tabs tablet Take 1 tablet by mouth every evening.        [place]  Supply Follow up.   Why: Oxygen- to be delivered to the home Contact information: Address: [address], [place], [place] [address]  Phone: [phone]               Allergies  Allergen Reactions   Alcohol Rash    Causes skin irritation   Chlorhexidine Itching    Burns skin   Other Other (See Comments)    Local anesthesia (quickly metabolizes)   Tape Other (See Comments)    Adhesive tape (irritates skin)    The results of significant diagnostics from this hospitalization (including imaging, microbiology, ancillary and laboratory) are listed below for reference.    Microbiology: No results found for this or any previous visit (from the past 240 hour(s)).  Procedures/Studies: CARDIAC CATHETERIZATION  Addendum Date: 08/22/2022   Coronary intervention and placement of right external iliac artery covered stent 08/21/22: Successful PTCA and stenting of the  codominant RCA ulcerated 90% tandem stenosis in the distal RCA and PDA with implantation of 1 long 3.0 x 48 mm Synergy XD DES deployed at 16 atmospheric pressure.  Stenosis reduced from 90% to 0% with TIMI-3 to TIMI-3 flow. Successful closure of the right external iliac artery arteriotomy site with implantation of a Viabahn 8.0 x 29 mm covered stent, postdilated with a 10 x 20 mm balloon.  Complete hemostasis obtained with appropriate vessel size matching. Recommendation: Patient will need aggressive risk factor modification.  He will need dual antiplatelet therapy for at least 1 year in view of ACS.  From vascular standpoint, DAPT for 3 months is adequate. Total contrast used 120 mL.  Result Date: 08/22/2022 Images from the original result were not included. Coronary intervention and placement of right external iliac artery covered stent 08/21/22: Successful PTCA and stenting of the codominant RCA ulcerated 90% tandem stenosis in the distal RCA and PDA with implantation of 1 long 3.0 x 48 mm Synergy XD DES deployed at 16 atmospheric pressure.  Stenosis reduced from 90% to 0% with TIMI-3 to TIMI-3 flow. Successful closure of the right external iliac artery arteriotomy site with implantation of a Viabahn 8.0 x 29 mm covered stent, postdilated with a 10 x 20 mm balloon.  Complete hemostasis obtained with appropriate vessel size matching. Recommendation: Patient will need aggressive risk factor modification.  He will need dual antiplatelet therapy for at least 1 year in view of ACS.  From vascular standpoint, DAPT for 3 months is adequate. Total contrast used 1 120 mL.   PERIPHERAL VASCULAR CATHETERIZATION  Addendum Date: 08/22/2022   Coronary intervention and placement of right external iliac artery covered stent 08/21/22: Successful PTCA and stenting of the codominant RCA ulcerated 90% tandem stenosis in the distal RCA and PDA with implantation of 1 long 3.0 x 48 mm Synergy XD DES deployed at 16 atmospheric  pressure.  Stenosis reduced from 90% to 0% with TIMI-3 to TIMI-3 flow. Successful closure of the right external iliac artery arteriotomy site with implantation of a Viabahn 8.0 x 29 mm covered stent, postdilated with a 10 x 20 mm balloon.  Complete hemostasis obtained with appropriate vessel size matching. Recommendation: Patient will need aggressive risk factor modification.  He will need dual antiplatelet therapy for at least 1 year in view of ACS.  From vascular standpoint, DAPT for 3 months is adequate. Total contrast used 120 mL.  Result Date: 08/22/2022 Images from the original result were not included. Coronary intervention and placement of right external iliac artery covered stent 08/21/22: Successful  PTCA and stenting of the codominant RCA ulcerated 90% tandem stenosis in the distal RCA and PDA with implantation of 1 long 3.0 x 48 mm Synergy XD DES deployed at 16 atmospheric pressure.  Stenosis reduced from 90% to 0% with TIMI-3 to TIMI-3 flow. Successful closure of the right external iliac artery arteriotomy site with implantation of a Viabahn 8.0 x 29 mm covered stent, postdilated with a 10 x 20 mm balloon.  Complete hemostasis obtained with appropriate vessel size matching. Recommendation: Patient will need aggressive risk factor modification.  He will need dual antiplatelet therapy for at least 1 year in view of ACS.  From vascular standpoint, DAPT for 3 months is adequate. Total contrast used 1 120 mL.   CARDIAC CATHETERIZATION  Result Date: 08/21/2022   Prox LAD lesion is 50% stenosed.   Dist Cx lesion is 50% stenosed.   Prox RCA lesion is 40% stenosed.   Mid RCA lesion is 50% stenosed.   Dist RCA lesion is 90% stenosed.   RPDA lesion is 99% stenosed.   There is moderate left ventricular systolic dysfunction.   LV end diastolic pressure is moderately elevated.   The left ventricular ejection fraction is 35-45% by visual estimate. RCA stenting/angioplasty by Dr. Malachy Mood Chest Portable 1  View  Result Date: 08/19/2022 CLINICAL DATA:  Left side chest pain EXAM: PORTABLE CHEST 1 VIEW COMPARISON:  08/16/2022 FINDINGS: Left dialysis catheter remains in place, unchanged. Heart mildly enlarged, stable. Small right pleural effusion and right basilar opacity, similar to prior study. No confluent opacity on the left. No acute bony abnormality. IMPRESSION: Stable small right pleural effusion with right base atelectasis or infiltrate. Electronically Signed   By: Rolm Baptise M.D.   On: 08/19/2022 23:13   ECHOCARDIOGRAM COMPLETE  Result Date: 08/16/2022    ECHOCARDIOGRAM REPORT   Patient Name:   Jerry [name] Date of Exam: 08/16/2022 Medical Rec #:  [id number]        Height:       68.0 in Accession #:    [id number]       Weight:       250.9 lb Date of Birth:  07-28-1970        BSA:          2.250 m Patient Age:    52 years         BP:           126/108 mmHg Patient Gender: M                HR:           93 bpm. Exam Location:  Inpatient Procedure: 2D Echo Indications:    NSTEMI  History:        Patient has prior history of Echocardiogram examinations, most                 recent 01/30/2018. Risk Factors:Sleep Apnea.  Sonographer:    Johny Chess RDCS Referring Phys: [id number] [place]  Sonographer Comments: Patient is obese. Image acquisition challenging due to patient body habitus and snoring. IMPRESSIONS  1. Left ventricular ejection fraction, by estimation, is 45 to 50%. The left ventricle has mildly decreased function. The left ventricle demonstrates regional wall motion abnormalities (see scoring diagram/findings for description). There is moderate concentric left ventricular hypertrophy. Left ventricular diastolic parameters are indeterminate.  2. Right ventricular systolic function is normal. The right ventricular size is normal. There is normal pulmonary artery systolic pressure.  3.  There is no evidence of cardiac tamponade.  4. The mitral valve is normal in structure. Trivial mitral valve  regurgitation. No evidence of mitral stenosis.  5. The aortic valve is tricuspid. Aortic valve regurgitation is not visualized. No aortic stenosis is present.  6. Aortic dilatation noted. There is mild dilatation of the ascending aorta, measuring 39 mm.  7. The inferior vena cava is normal in size with greater than 50% respiratory variability, suggesting right atrial pressure of 3 mmHg. FINDINGS  Left Ventricle: Left ventricular ejection fraction, by estimation, is 45 to 50%. The left ventricle has mildly decreased function. The left ventricle demonstrates regional wall motion abnormalities. The left ventricular internal cavity size was normal in size. There is moderate concentric left ventricular hypertrophy. Left ventricular diastolic parameters are indeterminate.  LV Wall Scoring: The mid and distal anterior wall and entire lateral wall are hypokinetic. The entire septum, entire inferior wall, basal anterior segment, and apex are normal. Right Ventricle: The right ventricular size is normal. No increase in right ventricular wall thickness. Right ventricular systolic function is normal. There is normal pulmonary artery systolic pressure. The tricuspid regurgitant velocity is 1.44 m/s, and  with an assumed right atrial pressure of 3 mmHg, the estimated right ventricular systolic pressure is 99991111 mmHg. Left Atrium: Left atrial size was normal in size. Right Atrium: Right atrial size was normal in size. Pericardium: Trivial pericardial effusion is present. There is no evidence of cardiac tamponade. Mitral Valve: The mitral valve is normal in structure. Trivial mitral valve regurgitation. No evidence of mitral valve stenosis. Tricuspid Valve: The tricuspid valve is normal in structure. Tricuspid valve regurgitation is trivial. No evidence of tricuspid stenosis. Aortic Valve: The aortic valve is tricuspid. Aortic valve regurgitation is not visualized. No aortic stenosis is present. Pulmonic Valve: The pulmonic valve  was normal in structure. Pulmonic valve regurgitation is not visualized. No evidence of pulmonic stenosis. Aorta: Aortic dilatation noted. There is mild dilatation of the ascending aorta, measuring 39 mm. Venous: The inferior vena cava is normal in size with greater than 50% respiratory variability, suggesting right atrial pressure of 3 mmHg. IAS/Shunts: No atrial level shunt detected by color flow Doppler.  LEFT VENTRICLE PLAX 2D LVIDd:         5.80 cm LVIDs:         4.20 cm LV PW:         1.30 cm LV IVS:        1.50 cm LVOT diam:     2.40 cm LV SV:         59 LV SV Index:   26 LVOT Area:     4.52 cm  LV Volumes (MOD) LV vol d, MOD A4C: 184.0 ml LV vol s, MOD A4C: 93.9 ml LV SV MOD A4C:     184.0 ml RIGHT VENTRICLE             IVC RV Basal diam:  2.90 cm     IVC diam: 1.60 cm RV S prime:     14.60 cm/s TAPSE (M-mode): 1.3 cm LEFT ATRIUM             Index        RIGHT ATRIUM           Index LA diam:        3.60 cm 1.60 cm/m   RA Area:     12.30 cm LA Vol (A2C):   47.5 ml 21.11 ml/m  RA Volume:   27.80  ml  12.35 ml/m LA Vol (A4C):   55.9 ml 24.84 ml/m LA Biplane Vol: 54.1 ml 24.04 ml/m  AORTIC VALVE LVOT Vmax:   74.10 cm/s LVOT Vmean:  48.400 cm/s LVOT VTI:    0.131 m  AORTA Ao Root diam: 3.50 cm Ao Asc diam:  3.90 cm TRICUSPID VALVE TR Peak grad:   8.3 mmHg TR Vmax:        144.00 cm/s  SHUNTS Systemic VTI:  0.13 m Systemic Diam: 2.40 cm Skeet Latch MD Electronically signed by Skeet Latch MD Signature Date/Time: 08/16/2022/12:09:05 PM    Final    DG Chest 2 View  Result Date: 08/16/2022 CLINICAL DATA:  Chest pain EXAM: CHEST - 2 VIEW COMPARISON:  08/12/2022 FINDINGS: Left IJ dual-lumen catheter tip at the superior cavoatrial junction. Stable cardiomegaly. Improved predominantly basilar airspace opacities since 08/12/2022. Slight decrease in right pleural effusion. IMPRESSION: Improving airspace opacities compared to 08/12/2022. Small right pleural effusion. Cardiomegaly. Electronically Signed    By: Placido Sou M.D.   On: 08/16/2022 02:40   DG Chest 2 View  Result Date: 08/12/2022 CLINICAL DATA:  Chest pain. EXAM: CHEST - 2 VIEW COMPARISON:  05/23/2020 FINDINGS: Left jugular double-lumen catheter with its tip in the upper right atrium near the superior cavoatrial junction. Enlarged cardiac silhouette with an interval increase in size. Tortuous aorta. Small bilateral pleural effusions, increased. Patchy density in both lower lung zones, greater on the right. Diffusely prominent interstitial markings without Kerley lines. Mildly prominent pulmonary vasculature. Unremarkable bones. IMPRESSION: 1. Cardiomegaly and mild pulmonary vascular congestion. 2. Small bilateral pleural effusions, increased. 3. Patchy atelectasis, pneumonia or alveolar edema in both lower lung zones, greater on the right. 4. Chronic interstitial lung disease. Electronically Signed   By: Claudie Revering M.D.   On: 08/12/2022 18:21    Labs: BNP (last 3 results) Recent Labs    08/12/22 1803 08/16/22 1400 08/19/22 2258  BNP 434.4* 243.1* [id number]   Basic Metabolic Panel: No results for input(s): "NA", "K", "CL", "CO2", "GLUCOSE", "BUN", "CREATININE", "CALCIUM", "MG", "PHOS" in the last 168 hours.  Liver Function Tests: No results for input(s): "AST", "ALT", "ALKPHOS", "BILITOT", "PROT", "ALBUMIN" in the last 168 hours.  CBC: No results for input(s): "WBC", "NEUTROABS", "HGB", "HCT", "MCV", "PLT" in the last 168 hours.  Cardiac Enzymes: No results for input(s): "CKTOTAL", "CKMB", "CKMBINDEX", "TROPONINI" in the last 168 hours. BNP: Invalid input(s): "POCBNP" CBG: No results for input(s): "GLUCAP" in the last 168 hours.  Sepsis Labs No results for input(s): "WBC" in the last 168 hours.  Invalid input(s): "PROCALCITONIN", "LACTICIDVEN"  Microbiology No results found for this or any previous visit (from the past 240 hour(s)). Time coordinating discharge: 35 minutes  SIGNED: Antonieta Pert, MD  [facility] 08/30/2022, 12:44 PM  If 7PM-7AM, please contact night-coverage [email or web address]   High intensity hydralazine.  Currently chest pain-free doing well cleared for discharge by cardiology.

## 2022-08-24 ENCOUNTER — Telehealth (HOSPITAL_COMMUNITY): Payer: Self-pay | Admitting: Nephrology

## 2022-08-24 NOTE — Telephone Encounter (Signed)
Transition of care contact from inpatient facility  Date of Discharge: 08/23/22 Date of Contact:08/24/22 - attempted Method of contact: Phone  Attempted to contact patient to discuss transition of care from inpatient admission. Patient did not answer the phone. Message was left on the patient's voicemail with call back number [phone].  [name], PA-C [employer] Pager [phone]

## 2022-09-01 ENCOUNTER — Telehealth (HOSPITAL_COMMUNITY): Payer: Self-pay

## 2022-09-01 NOTE — Telephone Encounter (Signed)
Attempted to contact pt and Dr. Doylene Canard office to verify if pt has completed his f/u appt. No answer at Dr. Doylene Canard office, will fax hospital f/u appt office notes.   LMTCB for pt

## 2022-09-01 NOTE — Telephone Encounter (Signed)
Pt insurance is active and benefits verified through [facility] Medicare. Co-pay $0.00, DED $0.00/$0.00 met, out of pocket $3,600.00/$54.97 met, co-insurance 0%. No pre-authorization required. Passport, 09/01/2022 @ 10:32AM, [id number]   How many CR sessions are covered? (36 sessions for TCR, 72 sessions for ICR)72 Is this a lifetime maximum or an annual maximum? Lifetime Has the member used any of these services to date? No Is there a time limit (weeks/months) on start of program and/or program completion? No     Will contact patient to see if he is interested in the Cardiac Rehab Program. If interested, patient will need to complete follow up appt. Once completed, patient will be contacted for scheduling upon review by the RN Navigator.

## 2022-09-18 ENCOUNTER — Other Ambulatory Visit (HOSPITAL_COMMUNITY): Payer: Self-pay

## 2022-09-25 ENCOUNTER — Other Ambulatory Visit (HOSPITAL_COMMUNITY): Payer: Self-pay

## 2022-11-21 ENCOUNTER — Other Ambulatory Visit (HOSPITAL_COMMUNITY): Payer: Self-pay

## 2023-02-21 ENCOUNTER — Other Ambulatory Visit: Payer: Self-pay | Admitting: *Deleted

## 2023-02-21 DIAGNOSIS — N186 End stage renal disease: Secondary | ICD-10-CM

## 2023-03-14 ENCOUNTER — Ambulatory Visit (HOSPITAL_COMMUNITY)
Admission: RE | Admit: 2023-03-14 | Discharge: 2023-03-14 | Disposition: A | Discharge status: Home / Self Care | Payer: Medicare Other | Source: Ambulatory Visit | Attending: Vascular Surgery | Admitting: Vascular Surgery

## 2023-03-14 ENCOUNTER — Ambulatory Visit (INDEPENDENT_AMBULATORY_CARE_PROVIDER_SITE_OTHER): Payer: Medicare Other | Admitting: Vascular Surgery

## 2023-03-14 ENCOUNTER — Encounter: Payer: Self-pay | Admitting: Vascular Surgery

## 2023-03-14 ENCOUNTER — Ambulatory Visit (HOSPITAL_COMMUNITY): Admission: RE | Admit: 2023-03-14 | Payer: Medicare Other | Source: Ambulatory Visit

## 2023-03-14 VITALS — BP 123/83 | HR 92 | Temp 98.2°F | Resp 20 | Ht 68.0 in | Wt 256.0 lb

## 2023-03-14 DIAGNOSIS — N186 End stage renal disease: Secondary | ICD-10-CM | POA: Diagnosis present

## 2023-03-14 NOTE — H&P (View-Only) (Signed)
Patient ID: Jerry [name], male   DOB: [birth year], 52 y.o.   MRN: [id number]  Reason for Consult: Follow-up   Referred by Jerry Sable, MD  Subjective:     HPI:  Jerry [name] is a 52 y.o. male well-known to me with history of bilateral upper extremity dialysis access.  He currently dialyzes via catheter.  He is ambidextrous.  He continues to work Network engineer music.  He does not have any pacemakers or ports in place.  Past Medical History:  Diagnosis Date   Complication of anesthesia    "benadryl knocks me out; worse than any normal reactions; anesthesia/numbing RX wear off FAST" (01/30/2018) 2021 Took 3 weeks to able talk and eat soild food due to throat    ESRD (end stage renal disease) on dialysis (HCC)    "HAD DIALYSIS BEFORE TRANSPLANT; RESTARTED DIALYSIS 01/30/2018)-  [facility] - MWF   GERD (gastroesophageal reflux disease)    History of blood transfusion    Hypertension    OSA on CPAP    Pneumonia    Family History  Problem Relation Age of Onset   Heart failure Mother    Kidney failure Father    Past Surgical History:  Procedure Laterality Date   A/V FISTULAGRAM N/A 02/19/2020   Procedure: A/V FISTULAGRAM;  Surgeon: Nada Libman, MD;  Location: [facility];  Service: Cardiovascular;  Laterality: N/A;   AV FISTULA PLACEMENT Right 2014   AV FISTULA PLACEMENT Right 12/26/2019   Procedure: Repair and Revision of Right Forearm AV Fistula;  Surgeon: Larina Earthly, MD;  Location: [facility] OR;  Service: Vascular;  Laterality: Right;   AV FISTULA PLACEMENT Left 07/29/2020   Procedure: LEFT ARM BRACHIOCEPHALIC ARTERIOVENOUS (AV) FISTULA CREATION;  Surgeon: Maeola Harman, MD;  Location: [facility] OR;  Service: Vascular;  Laterality: Left;   BASCILIC VEIN TRANSPOSITION Left 11/09/2020   Procedure: LEFT BRACHIOCEPHALIC ARTERIOVENOUS FISTULA TRANSPOSITION;  Surgeon: Maeola Harman, MD;  Location: [facility] OR;  Service: Vascular;  Laterality: Left;   CORONARY STENT  INTERVENTION N/A 08/21/2022   Procedure: CORONARY STENT INTERVENTION;  Surgeon: Yates Decamp, MD;  Location: [facility];  Service: Cardiovascular;  Laterality: N/A;   INSERTION OF DIALYSIS CATHETER Left 02/02/2020   Procedure: INSERTION OF LEFT INTERNAL JUGULAR 28 CM TUNNELED DIALYSIS CATHETER UNDER ULTRASOUND GUIDANCE;  Surgeon: Cephus Shelling, MD;  Location: [facility] OR;  Service: Vascular;  Laterality: Left;   KIDNEY TRANSPLANT  2014   at [facility]   LEFT HEART CATH AND CORONARY ANGIOGRAPHY N/A 08/21/2022   Procedure: LEFT HEART CATH AND CORONARY ANGIOGRAPHY;  Surgeon: Orpah Cobb, MD;  Location: [facility];  Service: Cardiovascular;  Laterality: N/A;   LIGATION OF COMPETING BRANCHES OF ARTERIOVENOUS FISTULA Left 11/09/2020   Procedure: LIGATION OF COMPETING BRANCHES OF LEFT BRACHIOCEPHALIC ARTERIOVENOUS FISTULA;  Surgeon: Maeola Harman, MD;  Location: [facility] OR;  Service: Vascular;  Laterality: Left;   PARATHYROIDECTOMY  2014   at [facility]   PERIPHERAL VASCULAR INTERVENTION Right 02/19/2020   Procedure: PERIPHERAL VASCULAR INTERVENTION;  Surgeon: Nada Libman, MD;  Location: [facility];  Service: Cardiovascular;  Laterality: Right;   PERIPHERAL VASCULAR INTERVENTION Right 08/21/2022   Procedure: PERIPHERAL VASCULAR INTERVENTION;  Surgeon: Yates Decamp, MD;  Location: [facility];  Service: Cardiovascular;  Laterality: Right;   REVISON OF ARTERIOVENOUS FISTULA Right 05/22/2020   Procedure: REPAIR OF RIGHT ARTERIOVENOUS FISTULA;  Surgeon: Maeola Harman, MD;  Location: [facility] OR;  Service: Vascular;  Laterality: Right;    Short Social History:  Social History   Tobacco Use   Smoking status: Former    Types: Cigars   Smokeless tobacco: Never   Tobacco comments:    2  times a week-vaping  Substance Use Topics   Alcohol use: Yes    Alcohol/week: 1.0 standard drink of alcohol    Types: 1 Standard drinks or equivalent per week    Allergies  Allergen  Reactions   Alcohol Rash    Causes skin irritation   Chlorhexidine Itching    Burns skin   Other Other (See Comments)    Local anesthesia (quickly metabolizes)   Tape Other (See Comments)    Adhesive tape (irritates skin)    Current Outpatient Medications  Medication Sig Dispense Refill   acetaminophen (TYLENOL) 500 MG tablet Take 1,000 mg by mouth every 6 (six) hours as needed for mild pain.     aspirin EC 81 MG tablet Take 1 tablet (81 mg total) by mouth daily. Swallow whole. 30 tablet 0   calcium acetate (PHOSLO) 667 MG capsule Take 1,334-2,668 mg by mouth with breakfast, with lunch, and with evening meal. Take 4 capsules (2,668 mg) by mouth with each meal & take 2 capsules (1,334 mg) by mouth with snacks     losartan (COZAAR) 25 MG tablet Take 25 mg by mouth at bedtime.     multivitamin (RENA-VIT) TABS tablet Take 1 tablet by mouth every evening.     RENVELA 800 MG tablet Take 800 mg by mouth 4 (four) times daily.     atorvastatin (LIPITOR) 80 MG tablet Take 1 tablet (80 mg total) by mouth daily. 30 tablet 0   hydrALAZINE (APRESOLINE) 10 MG tablet Take 1 tablet (10 mg total) by mouth every 8 (eight) hours. 90 tablet 0   metoprolol succinate (TOPROL-XL) 50 MG 24 hr tablet Take 1 tablet (50 mg total) by mouth daily. Take with or immediately following a meal. 30 tablet 0   Current Facility-Administered Medications  Medication Dose Route Frequency Provider Last Rate Last Admin   0.9 %  sodium chloride infusion  250 mL Intravenous PRN Nada Libman, MD        Review of Systems  Constitutional:  Constitutional negative. HENT: HENT negative.  Eyes: Eyes negative.  Respiratory: Respiratory negative.  Cardiovascular: Cardiovascular negative.  GI: Gastrointestinal negative.  Musculoskeletal: Musculoskeletal negative.  Skin: Skin negative.  Neurological: Neurological negative. Hematologic: Hematologic/lymphatic negative.  Psychiatric: Psychiatric negative.        Objective:   Objective   Vitals:   03/14/23 1403  BP: 123/83  Pulse: 92  Resp: 20  Temp: 98.2 F (36.8 C)  SpO2: 94%  Weight: 256 lb (116.1 kg)  Height: 5\' 8"  (1.727 m)   Body mass index is 38.92 kg/m.  Physical Exam HENT:     Head: Normocephalic.     Nose: Nose normal.  Eyes:     Pupils: Pupils are equal, round, and reactive to light.  Cardiovascular:     Pulses:          Radial pulses are 2+ on the right side and 2+ on the left side.  Pulmonary:     Effort: Pulmonary effort is normal.  Abdominal:     General: Abdomen is flat.  Musculoskeletal:        General: Normal range of motion.     Right lower leg: No edema.     Left lower leg: No edema.  Skin:  General: Skin is warm.     Capillary Refill: Capillary refill takes less than 2 seconds.  Neurological:     General: No focal deficit present.     Mental Status: He is alert.  Psychiatric:        Mood and Affect: Mood normal.        Thought Content: Thought content normal.        Judgment: Judgment normal.     Data: Right Pre-Dialysis Findings:  +-----------------------+----------+--------------------+---------+--------  +  Location              PSV (cm/s)Intralum. Diam. (cm)Waveform  Comments  +-----------------------+----------+--------------------+---------+--------  +  Brachial Antecub. [id number]        0.58                triphasic           +-----------------------+----------+--------------------+---------+--------  +  Radial Art at Wrist    30        0.16                triphasic           +-----------------------+----------+--------------------+---------+--------  +  Ulnar Art at Wrist     64        0.25                triphasic           +-----------------------+----------+--------------------+---------+--------  +    Left Pre-Dialysis Findings:  +-----------------------+----------+--------------------+---------+--------  +  Location              PSV (cm/s)Intralum. Diam.  (cm)Waveform  Comments  +-----------------------+----------+--------------------+---------+--------  +  Brachial Antecub. [id number]        0.47                triphasic           +-----------------------+----------+--------------------+---------+--------  +  Radial Art at Wrist    54        0.23                triphasic           +-----------------------+----------+--------------------+---------+--------  +  Ulnar Art at Wrist     56        0.20                triphasic           +-----------------------+----------+--------------------+---------+--------  +  Right Cephalic   Diameter (cm)Depth (cm)Findings   +-----------------+-------------+----------+---------+  Shoulder            0.22        0.94              +-----------------+-------------+----------+---------+  Prox upper arm       0.41        0.73              +-----------------+-------------+----------+---------+  Mid upper arm        0.46       56.00              +-----------------+-------------+----------+---------+  Dist upper arm       0.29        0.66              +-----------------+-------------+----------+---------+  Antecubital fossa    0.36        0.54              +-----------------+-------------+----------+---------+  Prox forearm  0.36        1.05              +-----------------+-------------+----------+---------+  Mid forearm                             prior avf  +-----------------+-------------+----------+---------+  Dist forearm                            prior avf  +-----------------+-------------+----------+---------+   +-----------------+-------------+----------+--------------+  Right Basilic    Diameter (cm)Depth (cm)   Findings     +-----------------+-------------+----------+--------------+  Prox upper arm       0.38        1.79                   +-----------------+-------------+----------+--------------+  Mid upper  arm        0.50        1.42                   +-----------------+-------------+----------+--------------+  Dist upper arm       0.47        0.95                   +-----------------+-------------+----------+--------------+  Antecubital fossa    0.40        0.68                   +-----------------+-------------+----------+--------------+  Prox forearm         0.24        0.37                   +-----------------+-------------+----------+--------------+  Mid forearm          0.18        0.47                   +-----------------+-------------+----------+--------------+  Distal forearm                          not visualized  +-----------------+-------------+----------+--------------+  Wrist                                  not visualized  +-----------------+-------------+----------+--------------+   +-----------------+-------------+----------+---------+  Left Cephalic    Diameter (cm)Depth (cm)Findings   +-----------------+-------------+----------+---------+  Prox upper arm                          prior avf  +-----------------+-------------+----------+---------+  Mid upper arm                           prior avf  +-----------------+-------------+----------+---------+  Dist upper arm                          prior avf  +-----------------+-------------+----------+---------+  Antecubital fossa    0.42        0.16              +-----------------+-------------+----------+---------+  Prox forearm         0.24        0.64              +-----------------+-------------+----------+---------+  Mid forearm  0.30        0.57              +-----------------+-------------+----------+---------+  Dist forearm         0.43        0.60              +-----------------+-------------+----------+---------+  Wrist               0.39        0.58              +-----------------+-------------+----------+---------+    +-----------------+-------------+----------+--------+  Left Basilic     Diameter (cm)Depth (cm)Findings  +-----------------+-------------+----------+--------+  Prox upper arm       0.34        0.88             +-----------------+-------------+----------+--------+  Mid upper arm        0.34        0.84             +-----------------+-------------+----------+--------+  Dist upper arm       0.41        1.14             +-----------------+-------------+----------+--------+  Antecubital fossa    0.50        1.18             +-----------------+-------------+----------+--------+  Prox forearm         0.27        1.01             +-----------------+-------------+----------+--------+  Mid forearm          0.23        0.26             +-----------------+-------------+----------+--------+  Distal forearm       0.20        0.30             +-----------------+-------------+----------+--------+         Assessment/Plan:    52 year old male with history of bilateral extremity dialysis accesses which have both failed.  We have discussed his options being basilic vein fistula versus graft on either side given that he is ambidextrous.  We also discussed possible venography bilateral upper extremities for better planning and he demonstrates good understanding and would like to pursue this.  He is on dialysis Mondays Wednesdays and Fridays so likely cannot be performed on Monday and will need to be performed by one of my partners and he demonstrates good understanding of this.    Plan for bilateral upper extremity venography for dialysis planning purposes.     Maeola Harman MD [facility]

## 2023-03-14 NOTE — Progress Notes (Signed)
Patient ID: Jerry [name], male   DOB: [birth year], 52 y.o.   MRN: [id number]  Reason for Consult: Follow-up   Referred by Annie Sable, MD  Subjective:     HPI:  Jerry [name] is a 52 y.o. male well-known to me with history of bilateral upper extremity dialysis access.  He currently dialyzes via catheter.  He is ambidextrous.  He continues to work Network engineer music.  He does not have any pacemakers or ports in place.  Past Medical History:  Diagnosis Date   Complication of anesthesia    "benadryl knocks me out; worse than any normal reactions; anesthesia/numbing RX wear off FAST" (01/30/2018) 2021 Took 3 weeks to able talk and eat soild food due to throat    ESRD (end stage renal disease) on dialysis (HCC)    "HAD DIALYSIS BEFORE TRANSPLANT; RESTARTED DIALYSIS 01/30/2018)-  [facility] - MWF   GERD (gastroesophageal reflux disease)    History of blood transfusion    Hypertension    OSA on CPAP    Pneumonia    Family History  Problem Relation Age of Onset   Heart failure Mother    Kidney failure Father    Past Surgical History:  Procedure Laterality Date   A/V FISTULAGRAM N/A 02/19/2020   Procedure: A/V FISTULAGRAM;  Surgeon: Nada Libman, MD;  Location: [facility];  Service: Cardiovascular;  Laterality: N/A;   AV FISTULA PLACEMENT Right 2014   AV FISTULA PLACEMENT Right 12/26/2019   Procedure: Repair and Revision of Right Forearm AV Fistula;  Surgeon: Larina Earthly, MD;  Location: [facility] OR;  Service: Vascular;  Laterality: Right;   AV FISTULA PLACEMENT Left 07/29/2020   Procedure: LEFT ARM BRACHIOCEPHALIC ARTERIOVENOUS (AV) FISTULA CREATION;  Surgeon: Maeola Harman, MD;  Location: [facility] OR;  Service: Vascular;  Laterality: Left;   BASCILIC VEIN TRANSPOSITION Left 11/09/2020   Procedure: LEFT BRACHIOCEPHALIC ARTERIOVENOUS FISTULA TRANSPOSITION;  Surgeon: Maeola Harman, MD;  Location: [facility] OR;  Service: Vascular;  Laterality: Left;   CORONARY STENT  INTERVENTION N/A 08/21/2022   Procedure: CORONARY STENT INTERVENTION;  Surgeon: Yates Decamp, MD;  Location: [facility];  Service: Cardiovascular;  Laterality: N/A;   INSERTION OF DIALYSIS CATHETER Left 02/02/2020   Procedure: INSERTION OF LEFT INTERNAL JUGULAR 28 CM TUNNELED DIALYSIS CATHETER UNDER ULTRASOUND GUIDANCE;  Surgeon: Cephus Shelling, MD;  Location: [facility] OR;  Service: Vascular;  Laterality: Left;   KIDNEY TRANSPLANT  2014   at [facility]   LEFT HEART CATH AND CORONARY ANGIOGRAPHY N/A 08/21/2022   Procedure: LEFT HEART CATH AND CORONARY ANGIOGRAPHY;  Surgeon: Orpah Cobb, MD;  Location: [facility];  Service: Cardiovascular;  Laterality: N/A;   LIGATION OF COMPETING BRANCHES OF ARTERIOVENOUS FISTULA Left 11/09/2020   Procedure: LIGATION OF COMPETING BRANCHES OF LEFT BRACHIOCEPHALIC ARTERIOVENOUS FISTULA;  Surgeon: Maeola Harman, MD;  Location: [facility] OR;  Service: Vascular;  Laterality: Left;   PARATHYROIDECTOMY  2014   at [facility]   PERIPHERAL VASCULAR INTERVENTION Right 02/19/2020   Procedure: PERIPHERAL VASCULAR INTERVENTION;  Surgeon: Nada Libman, MD;  Location: [facility];  Service: Cardiovascular;  Laterality: Right;   PERIPHERAL VASCULAR INTERVENTION Right 08/21/2022   Procedure: PERIPHERAL VASCULAR INTERVENTION;  Surgeon: Yates Decamp, MD;  Location: [facility];  Service: Cardiovascular;  Laterality: Right;   REVISON OF ARTERIOVENOUS FISTULA Right 05/22/2020   Procedure: REPAIR OF RIGHT ARTERIOVENOUS FISTULA;  Surgeon: Maeola Harman, MD;  Location: [facility] OR;  Service: Vascular;  Laterality: Right;    Short Social History:  Social History   Tobacco Use   Smoking status: Former    Types: Cigars   Smokeless tobacco: Never   Tobacco comments:    2  times a week-vaping  Substance Use Topics   Alcohol use: Yes    Alcohol/week: 1.0 standard drink of alcohol    Types: 1 Standard drinks or equivalent per week    Allergies  Allergen  Reactions   Alcohol Rash    Causes skin irritation   Chlorhexidine Itching    Burns skin   Other Other (See Comments)    Local anesthesia (quickly metabolizes)   Tape Other (See Comments)    Adhesive tape (irritates skin)    Current Outpatient Medications  Medication Sig Dispense Refill   acetaminophen (TYLENOL) 500 MG tablet Take 1,000 mg by mouth every 6 (six) hours as needed for mild pain.     aspirin EC 81 MG tablet Take 1 tablet (81 mg total) by mouth daily. Swallow whole. 30 tablet 0   calcium acetate (PHOSLO) 667 MG capsule Take 1,334-2,668 mg by mouth with breakfast, with lunch, and with evening meal. Take 4 capsules (2,668 mg) by mouth with each meal & take 2 capsules (1,334 mg) by mouth with snacks     losartan (COZAAR) 25 MG tablet Take 25 mg by mouth at bedtime.     multivitamin (RENA-VIT) TABS tablet Take 1 tablet by mouth every evening.     RENVELA 800 MG tablet Take 800 mg by mouth 4 (four) times daily.     atorvastatin (LIPITOR) 80 MG tablet Take 1 tablet (80 mg total) by mouth daily. 30 tablet 0   hydrALAZINE (APRESOLINE) 10 MG tablet Take 1 tablet (10 mg total) by mouth every 8 (eight) hours. 90 tablet 0   metoprolol succinate (TOPROL-XL) 50 MG 24 hr tablet Take 1 tablet (50 mg total) by mouth daily. Take with or immediately following a meal. 30 tablet 0   Current Facility-Administered Medications  Medication Dose Route Frequency Provider Last Rate Last Admin   0.9 %  sodium chloride infusion  250 mL Intravenous PRN Nada Libman, MD        Review of Systems  Constitutional:  Constitutional negative. HENT: HENT negative.  Eyes: Eyes negative.  Respiratory: Respiratory negative.  Cardiovascular: Cardiovascular negative.  GI: Gastrointestinal negative.  Musculoskeletal: Musculoskeletal negative.  Skin: Skin negative.  Neurological: Neurological negative. Hematologic: Hematologic/lymphatic negative.  Psychiatric: Psychiatric negative.        Objective:   Objective   Vitals:   03/14/23 1403  BP: 123/83  Pulse: 92  Resp: 20  Temp: 98.2 F (36.8 C)  SpO2: 94%  Weight: 256 lb (116.1 kg)  Height: 5\' 8"  (1.727 m)   Body mass index is 38.92 kg/m.  Physical Exam HENT:     Head: Normocephalic.     Nose: Nose normal.  Eyes:     Pupils: Pupils are equal, round, and reactive to light.  Cardiovascular:     Pulses:          Radial pulses are 2+ on the right side and 2+ on the left side.  Pulmonary:     Effort: Pulmonary effort is normal.  Abdominal:     General: Abdomen is flat.  Musculoskeletal:        General: Normal range of motion.     Right lower leg: No edema.     Left lower leg: No edema.  Skin:  General: Skin is warm.     Capillary Refill: Capillary refill takes less than 2 seconds.  Neurological:     General: No focal deficit present.     Mental Status: He is alert.  Psychiatric:        Mood and Affect: Mood normal.        Thought Content: Thought content normal.        Judgment: Judgment normal.     Data: Right Pre-Dialysis Findings:  +-----------------------+----------+--------------------+---------+--------  +  Location              PSV (cm/s)Intralum. Diam. (cm)Waveform  Comments  +-----------------------+----------+--------------------+---------+--------  +  Brachial Antecub. fossa51        0.58                triphasic           +-----------------------+----------+--------------------+---------+--------  +  Radial Art at Wrist    30        0.16                triphasic           +-----------------------+----------+--------------------+---------+--------  +  Ulnar Art at Wrist     64        0.25                triphasic           +-----------------------+----------+--------------------+---------+--------  +    Left Pre-Dialysis Findings:  +-----------------------+----------+--------------------+---------+--------  +  Location              PSV (cm/s)Intralum. Diam.  (cm)Waveform  Comments  +-----------------------+----------+--------------------+---------+--------  +  Brachial Antecub. fossa67        0.47                triphasic           +-----------------------+----------+--------------------+---------+--------  +  Radial Art at Wrist    54        0.23                triphasic           +-----------------------+----------+--------------------+---------+--------  +  Ulnar Art at Wrist     56        0.20                triphasic           +-----------------------+----------+--------------------+---------+--------  +  Right Cephalic   Diameter (cm)Depth (cm)Findings   +-----------------+-------------+----------+---------+  Shoulder            0.22        0.94              +-----------------+-------------+----------+---------+  Prox upper arm       0.41        0.73              +-----------------+-------------+----------+---------+  Mid upper arm        0.46       56.00              +-----------------+-------------+----------+---------+  Dist upper arm       0.29        0.66              +-----------------+-------------+----------+---------+  Antecubital fossa    0.36        0.54              +-----------------+-------------+----------+---------+  Prox forearm  0.36        1.05              +-----------------+-------------+----------+---------+  Mid forearm                             prior avf  +-----------------+-------------+----------+---------+  Dist forearm                            prior avf  +-----------------+-------------+----------+---------+   +-----------------+-------------+----------+--------------+  Right Basilic    Diameter (cm)Depth (cm)   Findings     +-----------------+-------------+----------+--------------+  Prox upper arm       0.38        1.79                   +-----------------+-------------+----------+--------------+  Mid upper  arm        0.50        1.42                   +-----------------+-------------+----------+--------------+  Dist upper arm       0.47        0.95                   +-----------------+-------------+----------+--------------+  Antecubital fossa    0.40        0.68                   +-----------------+-------------+----------+--------------+  Prox forearm         0.24        0.37                   +-----------------+-------------+----------+--------------+  Mid forearm          0.18        0.47                   +-----------------+-------------+----------+--------------+  Distal forearm                          not visualized  +-----------------+-------------+----------+--------------+  Wrist                                  not visualized  +-----------------+-------------+----------+--------------+   +-----------------+-------------+----------+---------+  Left Cephalic    Diameter (cm)Depth (cm)Findings   +-----------------+-------------+----------+---------+  Prox upper arm                          prior avf  +-----------------+-------------+----------+---------+  Mid upper arm                           prior avf  +-----------------+-------------+----------+---------+  Dist upper arm                          prior avf  +-----------------+-------------+----------+---------+  Antecubital fossa    0.42        0.16              +-----------------+-------------+----------+---------+  Prox forearm         0.24        0.64              +-----------------+-------------+----------+---------+  Mid forearm  0.30        0.57              +-----------------+-------------+----------+---------+  Dist forearm         0.43        0.60              +-----------------+-------------+----------+---------+  Wrist               0.39        0.58              +-----------------+-------------+----------+---------+    +-----------------+-------------+----------+--------+  Left Basilic     Diameter (cm)Depth (cm)Findings  +-----------------+-------------+----------+--------+  Prox upper arm       0.34        0.88             +-----------------+-------------+----------+--------+  Mid upper arm        0.34        0.84             +-----------------+-------------+----------+--------+  Dist upper arm       0.41        1.14             +-----------------+-------------+----------+--------+  Antecubital fossa    0.50        1.18             +-----------------+-------------+----------+--------+  Prox forearm         0.27        1.01             +-----------------+-------------+----------+--------+  Mid forearm          0.23        0.26             +-----------------+-------------+----------+--------+  Distal forearm       0.20        0.30             +-----------------+-------------+----------+--------+         Assessment/Plan:    52 year old male with history of bilateral extremity dialysis accesses which have both failed.  We have discussed his options being basilic vein fistula versus graft on either side given that he is ambidextrous.  We also discussed possible venography bilateral upper extremities for better planning and he demonstrates good understanding and would like to pursue this.  He is on dialysis Mondays Wednesdays and Fridays so likely cannot be performed on Monday and will need to be performed by one of my partners and he demonstrates good understanding of this.    Plan for bilateral upper extremity venography for dialysis planning purposes.     Maeola Harman MD [facility]

## 2023-03-21 ENCOUNTER — Other Ambulatory Visit: Payer: Self-pay

## 2023-03-21 DIAGNOSIS — N186 End stage renal disease: Secondary | ICD-10-CM

## 2023-03-21 MED ORDER — SODIUM CHLORIDE 0.9% FLUSH: 3.0000 mL | Freq: Two times a day (BID) | INTRAVENOUS | Status: DC

## 2023-03-21 MED ORDER — SODIUM CHLORIDE 0.9 % IV SOLN: 250.0000 mL | INTRAVENOUS | Status: DC | PRN

## 2023-03-26 ENCOUNTER — Telehealth: Payer: Self-pay

## 2023-03-26 NOTE — Telephone Encounter (Signed)
Received an after-hours message from 03/25/23 with patient requesting to cancel procedure for today and will call back to reschedule.   Call placed to patient. He reports he no longer has transportation. Patient requested to reschedule venography for 9/23. Instructions reviewed. Patient voiced understanding.

## 2023-04-09 ENCOUNTER — Ambulatory Visit (HOSPITAL_COMMUNITY)
Admission: RE | Admit: 2023-04-09 | Discharge: 2023-04-09 | Disposition: A | Discharge status: Home / Self Care | Payer: Medicare Other | Attending: Vascular Surgery | Admitting: Vascular Surgery

## 2023-04-09 ENCOUNTER — Encounter (HOSPITAL_COMMUNITY)
Admission: RE | Disposition: A | Discharge status: Home / Self Care | Payer: Self-pay | Source: Home / Self Care | Attending: Vascular Surgery

## 2023-04-09 ENCOUNTER — Other Ambulatory Visit: Payer: Self-pay

## 2023-04-09 DIAGNOSIS — Z992 Dependence on renal dialysis: Secondary | ICD-10-CM | POA: Diagnosis not present

## 2023-04-09 DIAGNOSIS — I120 Hypertensive chronic kidney disease with stage 5 chronic kidney disease or end stage renal disease: Secondary | ICD-10-CM | POA: Diagnosis present

## 2023-04-09 DIAGNOSIS — N186 End stage renal disease: Secondary | ICD-10-CM | POA: Diagnosis not present

## 2023-04-09 DIAGNOSIS — N185 Chronic kidney disease, stage 5: Secondary | ICD-10-CM

## 2023-04-09 DIAGNOSIS — T82898A Other specified complication of vascular prosthetic devices, implants and grafts, initial encounter: Secondary | ICD-10-CM | POA: Diagnosis not present

## 2023-04-09 DIAGNOSIS — Z87891 Personal history of nicotine dependence: Secondary | ICD-10-CM | POA: Diagnosis not present

## 2023-04-09 HISTORY — PX: UPPER EXTREMITY VENOGRAPHY: CATH118272

## 2023-04-09 LAB — POCT I-STAT, CHEM 8
BUN: 88 mg/dL — ABNORMAL HIGH (ref 6–20)
Calcium, Ion: 0.77 mmol/L — CL (ref 1.15–1.40)
Chloride: 102 mmol/L (ref 98–111)
Creatinine, Ser: 14.3 mg/dL — ABNORMAL HIGH (ref 0.61–1.24)
Glucose, Bld: 93 mg/dL (ref 70–99)
HCT: 35 % — ABNORMAL LOW (ref 39.0–52.0)
Hemoglobin: 11.9 g/dL — ABNORMAL LOW (ref 13.0–17.0)
Potassium: 5.4 mmol/L — ABNORMAL HIGH (ref 3.5–5.1)
Sodium: 141 mmol/L (ref 135–145)
TCO2: 26 mmol/L (ref 22–32)

## 2023-04-09 SURGERY — UPPER EXTREMITY VENOGRAPHY
Anesthesia: LOCAL | Laterality: Bilateral

## 2023-04-09 MED ORDER — LIDOCAINE HCL (PF) 1 % IJ SOLN
INTRAMUSCULAR | Status: DC | PRN
  Administered 2023-04-09: 2 mL

## 2023-04-09 MED ORDER — IODIXANOL 320 MG/ML IV SOLN
INTRAVENOUS | Status: DC | PRN
  Administered 2023-04-09: 53 mL

## 2023-04-09 MED ORDER — SODIUM CHLORIDE 0.9% FLUSH: 3.0000 mL | INTRAVENOUS | Status: DC | PRN

## 2023-04-09 MED ORDER — LIDOCAINE HCL (PF) 1 % IJ SOLN
INTRAMUSCULAR | Status: AC
  Filled 2023-04-09: qty 30

## 2023-04-09 SURGICAL SUPPLY — 2 items
KIT MICROPUNCTURE NIT STIFF (SHEATH) IMPLANT
TUBING CIL FLEX 10 FLL-RA (TUBING) IMPLANT

## 2023-04-09 NOTE — Op Note (Signed)
Patient name: Jerry [name] MRN: [id number] DOB: [birth year] Sex: male  04/09/2023 Pre-operative Diagnosis: End-stage renal disease with catheter dependence Post-operative diagnosis:  Same Surgeon:  Luanna Salk. Randie Heinz, MD Procedure Performed: 1.  Ultrasound-guided cannulation right cephalic vein 2.  Bilateral upper extremity venography  Indications: 52 year old male with end-stage renal disease currently dialyzing via left IJ catheter.  He has a history of a right IJ catheter and bilateral upper extremity failed access.  Given his previous accesses we have elected for venogram prior to proceeding with any further access.  He currently has a left IJ tunneled dialysis catheter in place.  Findings: Centrally he appeared occluded bilaterally with no flow.  He does have an IJ catheter in place on the left.  I ultimately cannulated the right cephalic vein to attempt to get better evaluation of the flow centrally but the SVC appears occluded.  His options will be either femoral based graft versus conversion to a left sided HeRO graft with placement of femoral TDC.  We will discuss this as an outpatient.   Procedure:  The patient was identified in the holding area and taken to room 8.  Timeout was called and we began by accessing his bilateral IVs and performing upper extremity venography which demonstrated what appeared to be occlusion of his central system bilaterally with the left IJ tunneled dialysis catheter in place.  The right arm was then prepped with Betadine and ultrasound was used to cannulate the right cephalic vein and further venography was performed but the SVC did appear occluded.  With this the catheter was removed and he will need either femoral based graft versus left sided HeRO graft with temporary femoral tunneled dialysis catheter.  He tolerated the procedure without any complication.  Contrast:53cc     Sahirah Rudell C. Randie Heinz, MD [facility] Office:  [phone] Pager: [phone]

## 2023-04-09 NOTE — Progress Notes (Signed)
Patient and wife was given discharge instructions. Both verbalized understanding. 

## 2023-04-09 NOTE — Interval H&P Note (Signed)
History and Physical Interval Note:  04/09/2023 7:24 AM  Jerry [name]  has presented today for surgery, with the diagnosis of instage renal.  The various methods of treatment have been discussed with the patient and family. After consideration of risks, benefits and other options for treatment, the patient has consented to  Procedure(s): UPPER EXTREMITY VENOGRAPHY (N/A) as a surgical intervention.  The patient's history has been reviewed, patient examined, no change in status, stable for surgery.  I have reviewed the patient's chart and labs.  Questions were answered to the patient's satisfaction.     Lemar Livings

## 2023-04-10 ENCOUNTER — Encounter (HOSPITAL_COMMUNITY): Payer: Self-pay | Admitting: Vascular Surgery

## 2023-05-15 ENCOUNTER — Other Ambulatory Visit: Payer: Self-pay | Admitting: *Deleted

## 2023-05-15 DIAGNOSIS — N186 End stage renal disease: Secondary | ICD-10-CM

## 2023-05-23 ENCOUNTER — Ambulatory Visit (HOSPITAL_COMMUNITY): Payer: Medicare Other

## 2023-05-23 ENCOUNTER — Encounter: Payer: Medicare Other | Admitting: Vascular Surgery

## 2023-05-30 ENCOUNTER — Emergency Department (HOSPITAL_COMMUNITY): Payer: Medicare Other

## 2023-05-30 ENCOUNTER — Emergency Department (HOSPITAL_COMMUNITY)
Admission: EM | Admit: 2023-05-30 | Discharge: 2023-05-31 | Disposition: A | Discharge status: Home / Self Care | Payer: Medicare Other | Attending: Emergency Medicine | Admitting: Emergency Medicine

## 2023-05-30 DIAGNOSIS — Z7982 Long term (current) use of aspirin: Secondary | ICD-10-CM | POA: Insufficient documentation

## 2023-05-30 DIAGNOSIS — I2089 Other forms of angina pectoris: Secondary | ICD-10-CM | POA: Diagnosis not present

## 2023-05-30 DIAGNOSIS — Z7902 Long term (current) use of antithrombotics/antiplatelets: Secondary | ICD-10-CM | POA: Insufficient documentation

## 2023-05-30 DIAGNOSIS — Z79899 Other long term (current) drug therapy: Secondary | ICD-10-CM | POA: Diagnosis not present

## 2023-05-30 DIAGNOSIS — Z992 Dependence on renal dialysis: Secondary | ICD-10-CM | POA: Diagnosis not present

## 2023-05-30 DIAGNOSIS — R079 Chest pain, unspecified: Secondary | ICD-10-CM | POA: Diagnosis present

## 2023-05-30 DIAGNOSIS — I120 Hypertensive chronic kidney disease with stage 5 chronic kidney disease or end stage renal disease: Secondary | ICD-10-CM | POA: Insufficient documentation

## 2023-05-30 DIAGNOSIS — N186 End stage renal disease: Secondary | ICD-10-CM | POA: Insufficient documentation

## 2023-05-30 NOTE — ED Provider Notes (Incomplete)
Jerry [name] Provider Note   CSN: [id number] Arrival date & time: 05/30/23  2223     History {Add pertinent medical, surgical, social history, OB history to HPI:1} Chief Complaint  Patient presents with  . Chest Pain    Jerry [name] is a 52 y.o. male.  Patient with past medical history significant for ESRD on Monday Wednesday Friday dialysis, hypertension, GERD, NSTEMI presents to the emergency department complaining of intermittent chest pain which began 2 days ago.  Patient states 2 days ago he was walking when he had a sudden onset of chest pressure in the left side of his chest with radiation to the neck and left shoulder.  He states he felt mildly short of breath with this episode.  It lasted for approximately 15 minutes and seem to be relieved by rest.  He states he had 2 episodes yesterday and has had 3 episodes of similar discomfort today.  Patient is asymptomatic at the time of my assessment.  He is denying chest pain, shortness of breath, abdominal pain, nausea, vomiting.  He states he had dialysis earlier today and it was a full/normal session.   Chest Pain      Home Medications Prior to Admission medications   Medication Sig Start Date End Date Taking? Authorizing Provider  acetaminophen (TYLENOL) 500 MG tablet Take 1,000 mg by mouth every 6 (six) hours as needed for mild pain.   Yes [provider]  amLODipine (NORVASC) 2.5 MG tablet Take 2.5 mg by mouth at bedtime. 05/09/23  Yes [provider]  aspirin EC 81 MG tablet Take 1 tablet (81 mg total) by mouth daily. Swallow whole. 08/18/22  Yes Tyrone Nine, MD  atorvastatin (LIPITOR) 40 MG tablet Take 40 mg by mouth daily. 03/31/23  Yes [provider]  calcium acetate (PHOSLO) 667 MG capsule Take 1,334-2,668 mg by mouth with breakfast, with lunch, and with evening meal. Take 4 capsules (2,668 mg) by mouth with each meal & take 2 capsules (1,334 mg) by  mouth with snacks 12/01/19  Yes [provider]  clopidogrel (PLAVIX) 75 MG tablet Take 75 mg by mouth daily. 04/01/23  Yes [provider]  metoprolol succinate (TOPROL-XL) 25 MG 24 hr tablet Take 25 mg by mouth daily. 03/28/23  Yes [provider]  multivitamin (RENA-VIT) TABS tablet Take 1 tablet by mouth every evening.   Yes [provider]  Tenapanor HCl, CKD, (XPHOZAH) 30 MG TABS Take 30 mg by mouth 2 (two) times daily. 09/20/22  Yes [provider]      Allergies    Alcohol, Chlorhexidine, Other, and Tape    Review of Systems   Review of Systems  Cardiovascular:  Positive for chest pain.    Physical Exam Updated Vital Signs There were no vitals taken for this visit. Physical Exam  ED Results / Procedures / Treatments   Labs (all labs ordered are listed, but only abnormal results are displayed) Labs Reviewed  COMPREHENSIVE METABOLIC PANEL  CBC WITH DIFFERENTIAL/PLATELET  BRAIN NATRIURETIC PEPTIDE  MAGNESIUM  TROPONIN I (HIGH SENSITIVITY)    EKG None  Radiology No results found.  Procedures Procedures  {Document cardiac monitor, telemetry assessment procedure when appropriate:1}  Medications Ordered in ED Medications - No data to display  ED Course/ Medical Decision Making/ A&P   {   Click here for ABCD2, HEART and other calculatorsREFRESH Note before signing :1}  Medical Decision Making Amount and/or Complexity of Data Reviewed Labs: ordered. Radiology: ordered.   ***  {Document critical care time when appropriate:1} {Document review of labs and clinical decision tools ie heart score, Chads2Vasc2 etc:1}  {Document your independent review of radiology images, and any outside records:1} {Document your discussion with family members, caretakers, and with consultants:1} {Document social determinants of health affecting pt's care:1} {Document your decision making why or why not  admission, treatments were needed:1} Final Clinical Impression(s) / ED Diagnoses Final diagnoses:  None    Rx / DC Orders ED Discharge Orders     None

## 2023-05-30 NOTE — ED Provider Notes (Signed)
[place] EMERGENCY DEPARTMENT AT [facility] Provider Note   CSN: [id number] Arrival date & time: 05/30/23  2223     History  Chief Complaint  Patient presents with   Chest Pain    Jerry [name] is a 52 y.o. male.  Patient with past medical history significant for ESRD on Monday Wednesday Friday dialysis, hypertension, GERD, NSTEMI presents to the emergency department complaining of intermittent chest pain which began 2 days ago.  Patient states 2 days ago he was walking when he had a sudden onset of chest pressure in the left side of his chest with radiation to the neck and left shoulder.  He states he felt mildly short of breath with this episode.  It lasted for approximately 15 minutes and seem to be relieved by rest.  He states he had 2 episodes yesterday and has had 3 episodes of similar discomfort today.  Patient is asymptomatic at the time of my assessment.  He is denying chest pain, shortness of breath, abdominal pain, nausea, vomiting.  He states he had dialysis earlier today and it was a full/normal session.   Chest Pain      Home Medications Prior to Admission medications   Medication Sig Start Date End Date Taking? Authorizing Provider  acetaminophen (TYLENOL) 500 MG tablet Take 1,000 mg by mouth every 6 (six) hours as needed for mild pain.   Yes [provider]  amLODipine (NORVASC) 2.5 MG tablet Take 2.5 mg by mouth at bedtime. 05/09/23  Yes [provider]  aspirin EC 81 MG tablet Take 1 tablet (81 mg total) by mouth daily. Swallow whole. 08/18/22  Yes Tyrone Nine, MD  atorvastatin (LIPITOR) 40 MG tablet Take 40 mg by mouth daily. 03/31/23  Yes [provider]  calcium acetate (PHOSLO) 667 MG capsule Take 1,334-2,668 mg by mouth with breakfast, with lunch, and with evening meal. Take 4 capsules (2,668 mg) by mouth with each meal & take 2 capsules (1,334 mg) by mouth with snacks 12/01/19  Yes [provider]  clopidogrel  (PLAVIX) 75 MG tablet Take 75 mg by mouth daily. 04/01/23  Yes [provider]  multivitamin (RENA-VIT) TABS tablet Take 1 tablet by mouth every evening.   Yes [provider]  nitroGLYCERIN (NITROSTAT) 0.4 MG SL tablet Place 1 tablet (0.4 mg total) under the tongue every 5 (five) minutes as needed for chest pain. 05/31/23  Yes Darrick Grinder, PA-C  Tenapanor HCl, CKD, (XPHOZAH) 30 MG TABS Take 30 mg by mouth 2 (two) times daily. 09/20/22  Yes [provider]  metoprolol succinate (TOPROL-XL) 25 MG 24 hr tablet Take 2 tablets (50 mg total) by mouth daily. 05/31/23 08/29/23  Darrick Grinder, PA-C      Allergies    Alcohol, Chlorhexidine, Other, and Tape    Review of Systems   Review of Systems  Cardiovascular:  Positive for chest pain.    Physical Exam Updated Vital Signs BP (!) 152/94   Pulse 80   Resp 14   SpO2 95%  Physical Exam Vitals and nursing note reviewed.  Constitutional:      General: He is not in acute distress.    Appearance: He is well-developed.  HENT:     Head: Normocephalic and atraumatic.  Eyes:     Conjunctiva/sclera: Conjunctivae normal.  Cardiovascular:     Rate and Rhythm: Normal rate and regular rhythm.  Pulmonary:     Effort: Pulmonary effort is normal. No respiratory distress.  Breath sounds: Normal breath sounds.  Abdominal:     Palpations: Abdomen is soft.     Tenderness: There is no abdominal tenderness.  Musculoskeletal:        General: No swelling.     Cervical back: Neck supple.  Skin:    General: Skin is warm and dry.     Capillary Refill: Capillary refill takes less than 2 seconds.  Neurological:     Mental Status: He is alert.  Psychiatric:        Mood and Affect: Mood normal.     ED Results / Procedures / Treatments   Labs (all labs ordered are listed, but only abnormal results are displayed) Labs Reviewed  COMPREHENSIVE METABOLIC PANEL - Abnormal; Notable for the following components:      Result  Value   Sodium 134 (*)    CO2 20 (*)    Glucose, Bld 107 (*)    BUN 53 (*)    Creatinine, Ser 8.63 (*)    Calcium 7.2 (*)    GFR, Estimated 7 (*)    All other components within normal limits  CBC WITH DIFFERENTIAL/PLATELET - Abnormal; Notable for the following components:   RBC 3.97 (*)    Hemoglobin 12.0 (*)    HCT 38.3 (*)    All other components within normal limits  BRAIN NATRIURETIC PEPTIDE - Abnormal; Notable for the following components:   B Natriuretic Peptide 102.7 (*)    All other components within normal limits  TROPONIN I (HIGH SENSITIVITY) - Abnormal; Notable for the following components:   Troponin I (High Sensitivity) 42 (*)    All other components within normal limits  TROPONIN I (HIGH SENSITIVITY) - Abnormal; Notable for the following components:   Troponin I (High Sensitivity) 37 (*)    All other components within normal limits  MAGNESIUM    EKG EKG Interpretation Date/Time:  Wednesday May 30 2023 22:31:47 EST Ventricular Rate:  94 PR Interval:  189 QRS Duration:  94 QT Interval:  398 QTC Calculation: 445 R Axis:   -15  Text Interpretation: Sinus rhythm Ventricular bigeminy Probable left atrial enlargement Borderline left axis deviation Borderline T wave abnormalities Confirmed by Drema Pry [phone]) on 05/31/2023 2:05:44 AM  Radiology DG Chest 2 View  Result Date: 05/31/2023 CLINICAL DATA:  Chest pain EXAM: CHEST - 2 VIEW COMPARISON:  08/19/2022, 02/02/2020, 08/12/2022 FINDINGS: Left-sided central venous catheter tip at the right atrium. Mild cardiomegaly. Small right pleural effusion versus chronic pleural thickening. No visible pneumothorax. IMPRESSION: Mild cardiomegaly. Small right pleural effusion versus chronic pleural thickening. Electronically Signed   By: Jasmine Pang M.D.   On: 05/31/2023 00:23    Procedures Procedures    Medications Ordered in ED Medications - No data to display  ED Course/ Medical Decision Making/ A&P                                  Medical Decision Making Amount and/or Complexity of Data Reviewed Labs: ordered. Radiology: ordered.  Risk Prescription drug management.   This patient presents to the ED for concern of chest pain, this involves an extensive number of treatment options, and is a complaint that carries with it a high risk of complications and morbidity.  The differential diagnosis includes ACS, PE, pneumonia, musculoskeletal pain, others   Co morbidities that complicate the patient evaluation  ESRD on hemodialysis, NSTEMI   Additional history obtained:  Additional history  obtained from family at bedside External records from outside source obtained and reviewed including discharge summary from February 7, patient admitted for chest pain   Lab Tests:  I Ordered, and personally interpreted labs.  The pertinent results include: Initial troponin 42, repeat 37, BNP 102.7, grossly unremarkable CBC and CMP   Imaging Studies ordered:  I ordered imaging studies including chest x-ray  I independently visualized and interpreted imaging which showed  Mild cardiomegaly. Small right pleural effusion versus chronic  pleural thickening.   I agree with the radiologist interpretation   Cardiac Monitoring: / EKG:  The patient was maintained on a cardiac monitor.  I personally viewed and interpreted the cardiac monitored which showed an underlying rhythm of: Sinus rhythm with PVCs   Consultations Obtained:  I requested consultation with the cardiology fellow on-call,  Dr.Sullivan and discussed lab and imaging findings as well as pertinent plan - they recommend: Increased metoprolol, PRN SL NTG, cardiology follow up   Social Determinants of Health:  Social Determinants of Health with Concerns   Tobacco Use: Medium Risk (03/14/2023)   Patient History    Smoking Tobacco Use: Former    Smokeless Tobacco Use: Never    Passive Exposure: Not on file  Physical Activity: Inactive  (08/20/2022)   Exercise Vital Sign    Days of Exercise per Week: 0 days    Minutes of Exercise per Session: 0 min  Alcohol Screen: Not on file  Health Literacy: Not on file      Test / Admission - Considered:  Presentation most consistent with stable angina with improving troponins, chest pain on exertion relieved with rest.  Discussed with the on-call cardiology fellow who agreed.  Recommended increasing metoprolol dosage to 50 mg daily.  Also agreed with the plan to prescribe as needed sublingual nitro for chest pain.  Patient will need close outpatient follow-up with cardiology.  Will place new ambulatory referral as patient has not seen cardiology in a while.  Patient will be provided with strict return precautions including worsening chest pain and shortness of breath.  Discussed with patient who voices understanding with plan.  Discharge home at this time.         Final Clinical Impression(s) / ED Diagnoses Final diagnoses:  Stable angina (HCC)    Rx / DC Orders ED Discharge Orders          Ordered    nitroGLYCERIN (NITROSTAT) 0.4 MG SL tablet  Every 5 min PRN,   Status:  Discontinued        05/31/23 0430    metoprolol succinate (TOPROL-XL) 25 MG 24 hr tablet  Daily        05/31/23 0431    nitroGLYCERIN (NITROSTAT) 0.4 MG SL tablet  Every 5 min PRN        05/31/23 0431    Ambulatory referral to Cardiology       Comments: If you have not heard from the Cardiology office within the next 72 hours please call [phone].   05/31/23 0454              Darrick Grinder, PA-C 05/31/23 0455    Nira Conn, MD 05/31/23 [phone]

## 2023-05-30 NOTE — ED Triage Notes (Signed)
Pt BIB EMS from home. C/o sudden onset CP/discomfort, radiating to arm Dialysis today, felt weak after treatment.   EMS VS 146/88, 93% RA EMS reports EKG showed PACs and bigeminy, in & out 6 times with 1 to 1 conduction  No meds given by ems

## 2023-05-31 ENCOUNTER — Other Ambulatory Visit: Payer: Self-pay

## 2023-05-31 LAB — COMPREHENSIVE METABOLIC PANEL
ALT: 16 U/L (ref 0–44)
AST: 18 U/L (ref 15–41)
Albumin: 3.5 g/dL (ref 3.5–5.0)
Alkaline Phosphatase: 120 U/L (ref 38–126)
Anion gap: 15 (ref 5–15)
BUN: 53 mg/dL — ABNORMAL HIGH (ref 6–20)
CO2: 20 mmol/L — ABNORMAL LOW (ref 22–32)
Calcium: 7.2 mg/dL — ABNORMAL LOW (ref 8.9–10.3)
Chloride: 99 mmol/L (ref 98–111)
Creatinine, Ser: 8.63 mg/dL — ABNORMAL HIGH (ref 0.61–1.24)
GFR, Estimated: 7 mL/min — ABNORMAL LOW (ref >60)
Glucose, Bld: 107 mg/dL — ABNORMAL HIGH (ref 70–99)
Potassium: 4.2 mmol/L (ref 3.5–5.1)
Sodium: 134 mmol/L — ABNORMAL LOW (ref 135–145)
Total Bilirubin: 0.3 mg/dL (ref <1.2)
Total Protein: 7.6 g/dL (ref 6.5–8.1)

## 2023-05-31 LAB — CBC WITH DIFFERENTIAL/PLATELET
Abs Immature Granulocytes: 0.04 10*3/uL (ref 0.00–0.07)
Basophils Absolute: 0.1 10*3/uL (ref 0.0–0.1)
Basophils Relative: 1 %
Eosinophils Absolute: 0.5 10*3/uL (ref 0.0–0.5)
Eosinophils Relative: 5 %
HCT: 38.3 % — ABNORMAL LOW (ref 39.0–52.0)
Hemoglobin: 12 g/dL — ABNORMAL LOW (ref 13.0–17.0)
Immature Granulocytes: 0 %
Lymphocytes Relative: 9 %
Lymphs Abs: 0.9 10*3/uL (ref 0.7–4.0)
MCH: 30.2 pg (ref 26.0–34.0)
MCHC: 31.3 g/dL (ref 30.0–36.0)
MCV: 96.5 fL (ref 80.0–100.0)
Monocytes Absolute: 1 10*3/uL (ref 0.1–1.0)
Monocytes Relative: 10 %
Neutro Abs: 7 10*3/uL (ref 1.7–7.7)
Neutrophils Relative %: 75 %
Platelets: 254 10*3/uL (ref 150–400)
RBC: 3.97 MIL/uL — ABNORMAL LOW (ref 4.22–5.81)
RDW: 14.1 % (ref 11.5–15.5)
WBC: 9.4 10*3/uL (ref 4.0–10.5)
nRBC: 0 % (ref 0.0–0.2)

## 2023-05-31 LAB — MAGNESIUM: Magnesium: 2.2 mg/dL (ref 1.7–2.4)

## 2023-05-31 LAB — BRAIN NATRIURETIC PEPTIDE: B Natriuretic Peptide: 102.7 pg/mL — ABNORMAL HIGH (ref 0.0–100.0)

## 2023-05-31 LAB — TROPONIN I (HIGH SENSITIVITY)
Troponin I (High Sensitivity): 42 ng/L — ABNORMAL HIGH (ref <18)
Troponin I (High Sensitivity): 37 ng/L — ABNORMAL HIGH (ref <18)

## 2023-05-31 MED ORDER — NITROGLYCERIN 0.4 MG SL SUBL: 0.4000 mg | SUBLINGUAL_TABLET | SUBLINGUAL | 0 refills | Status: DC | PRN

## 2023-05-31 MED ORDER — METOPROLOL SUCCINATE ER 25 MG PO TB24: 50.0000 mg | ORAL_TABLET | Freq: Every day | ORAL | 2 refills | Status: AC

## 2023-05-31 MED ORDER — NITROGLYCERIN 0.4 MG SL SUBL: 0.4000 mg | SUBLINGUAL_TABLET | SUBLINGUAL | 0 refills | Status: AC | PRN

## 2023-05-31 NOTE — Discharge Instructions (Addendum)
I have prescribed an increased dose of metoprolol to be taken daily along with nitroglycerin tablets to be taken as needed under the tongue for chest pain.  It is important that you schedule an appointment as soon as possible with cardiology for a follow-up.  I have placed a referral and they should be contacting you to help set up this appointment.  If you become short of breath or develop other significant life-threatening symptoms please return to the emergency department.

## 2023-05-31 NOTE — ED Notes (Signed)
Unsuccessful blood draw, x2

## 2023-05-31 NOTE — ED Notes (Signed)
Unable to obtain labs, 2 x RN unsuccessful attempts. IV ultrasound IV does not pull back blood. Phlebotomy to attempt lab draw.

## 2023-05-31 NOTE — ED Notes (Signed)
Stuck pt and wasn't able to obtain 2nd trop

## 2023-06-06 ENCOUNTER — Emergency Department (HOSPITAL_COMMUNITY): Payer: Medicare Other

## 2023-06-06 ENCOUNTER — Observation Stay (HOSPITAL_COMMUNITY): Payer: Medicare Other

## 2023-06-06 ENCOUNTER — Other Ambulatory Visit: Payer: Self-pay

## 2023-06-06 ENCOUNTER — Encounter (HOSPITAL_COMMUNITY): Payer: Self-pay

## 2023-06-06 ENCOUNTER — Observation Stay (HOSPITAL_COMMUNITY)
Admission: EM | Admit: 2023-06-06 | Discharge: 2023-06-07 | Disposition: A | Discharge status: Home / Self Care | Payer: Medicare Other | Attending: Cardiovascular Disease | Admitting: Cardiovascular Disease

## 2023-06-06 DIAGNOSIS — Z992 Dependence on renal dialysis: Secondary | ICD-10-CM | POA: Diagnosis not present

## 2023-06-06 DIAGNOSIS — I2510 Atherosclerotic heart disease of native coronary artery without angina pectoris: Secondary | ICD-10-CM | POA: Diagnosis not present

## 2023-06-06 DIAGNOSIS — K219 Gastro-esophageal reflux disease without esophagitis: Secondary | ICD-10-CM | POA: Diagnosis not present

## 2023-06-06 DIAGNOSIS — I739 Peripheral vascular disease, unspecified: Secondary | ICD-10-CM | POA: Diagnosis not present

## 2023-06-06 DIAGNOSIS — I120 Hypertensive chronic kidney disease with stage 5 chronic kidney disease or end stage renal disease: Secondary | ICD-10-CM | POA: Insufficient documentation

## 2023-06-06 DIAGNOSIS — Z7901 Long term (current) use of anticoagulants: Secondary | ICD-10-CM | POA: Insufficient documentation

## 2023-06-06 DIAGNOSIS — Z87891 Personal history of nicotine dependence: Secondary | ICD-10-CM | POA: Diagnosis not present

## 2023-06-06 DIAGNOSIS — E669 Obesity, unspecified: Secondary | ICD-10-CM | POA: Insufficient documentation

## 2023-06-06 DIAGNOSIS — Z955 Presence of coronary angioplasty implant and graft: Secondary | ICD-10-CM | POA: Insufficient documentation

## 2023-06-06 DIAGNOSIS — R0789 Other chest pain: Principal | ICD-10-CM

## 2023-06-06 DIAGNOSIS — F1090 Alcohol use, unspecified, uncomplicated: Secondary | ICD-10-CM | POA: Diagnosis not present

## 2023-06-06 DIAGNOSIS — I249 Acute ischemic heart disease, unspecified: Secondary | ICD-10-CM | POA: Diagnosis not present

## 2023-06-06 DIAGNOSIS — Z79899 Other long term (current) drug therapy: Secondary | ICD-10-CM | POA: Diagnosis not present

## 2023-06-06 DIAGNOSIS — N186 End stage renal disease: Secondary | ICD-10-CM | POA: Diagnosis not present

## 2023-06-06 LAB — COMPREHENSIVE METABOLIC PANEL
ALT: 22 U/L (ref 0–44)
AST: 23 U/L (ref 15–41)
Albumin: 4.3 g/dL (ref 3.5–5.0)
Alkaline Phosphatase: 130 U/L — ABNORMAL HIGH (ref 38–126)
Anion gap: 18 — ABNORMAL HIGH (ref 5–15)
BUN: 52 mg/dL — ABNORMAL HIGH (ref 6–20)
CO2: 22 mmol/L (ref 22–32)
Calcium: 7.3 mg/dL — ABNORMAL LOW (ref 8.9–10.3)
Chloride: 99 mmol/L (ref 98–111)
Creatinine, Ser: 9.25 mg/dL — ABNORMAL HIGH (ref 0.61–1.24)
GFR, Estimated: 6 mL/min — ABNORMAL LOW (ref >60)
Glucose, Bld: 93 mg/dL (ref 70–99)
Potassium: 4.1 mmol/L (ref 3.5–5.1)
Sodium: 139 mmol/L (ref 135–145)
Total Bilirubin: 0.3 mg/dL (ref <1.2)
Total Protein: 8.7 g/dL — ABNORMAL HIGH (ref 6.5–8.1)

## 2023-06-06 LAB — ECHOCARDIOGRAM COMPLETE
Area-P 1/2: 4.21 cm2
Calc EF: 55 %
Height: 69 in
S' Lateral: 2.6 cm
Single Plane A2C EF: 53.9 %
Single Plane A4C EF: 58.3 %
Weight: 4160 [oz_av]

## 2023-06-06 LAB — CBC WITH DIFFERENTIAL/PLATELET
Abs Immature Granulocytes: 0.04 10*3/uL (ref 0.00–0.07)
Basophils Absolute: 0.1 10*3/uL (ref 0.0–0.1)
Basophils Relative: 1 %
Eosinophils Absolute: 0.7 10*3/uL — ABNORMAL HIGH (ref 0.0–0.5)
Eosinophils Relative: 10 %
HCT: 42.2 % (ref 39.0–52.0)
Hemoglobin: 13.2 g/dL (ref 13.0–17.0)
Immature Granulocytes: 1 %
Lymphocytes Relative: 11 %
Lymphs Abs: 0.8 10*3/uL (ref 0.7–4.0)
MCH: 29.9 pg (ref 26.0–34.0)
MCHC: 31.3 g/dL (ref 30.0–36.0)
MCV: 95.5 fL (ref 80.0–100.0)
Monocytes Absolute: 0.6 10*3/uL (ref 0.1–1.0)
Monocytes Relative: 9 %
Neutro Abs: 4.7 10*3/uL (ref 1.7–7.7)
Neutrophils Relative %: 68 %
Platelets: 321 10*3/uL (ref 150–400)
RBC: 4.42 MIL/uL (ref 4.22–5.81)
RDW: 14.1 % (ref 11.5–15.5)
WBC: 7 10*3/uL (ref 4.0–10.5)
nRBC: 0 % (ref 0.0–0.2)

## 2023-06-06 LAB — BASIC METABOLIC PANEL
Anion gap: 17 — ABNORMAL HIGH (ref 5–15)
BUN: 76 mg/dL — ABNORMAL HIGH (ref 6–20)
CO2: 22 mmol/L (ref 22–32)
Calcium: 7.1 mg/dL — ABNORMAL LOW (ref 8.9–10.3)
Chloride: 99 mmol/L (ref 98–111)
Creatinine, Ser: 11.89 mg/dL — ABNORMAL HIGH (ref 0.61–1.24)
GFR, Estimated: 5 mL/min — ABNORMAL LOW (ref >60)
Glucose, Bld: 111 mg/dL — ABNORMAL HIGH (ref 70–99)
Potassium: 4.2 mmol/L (ref 3.5–5.1)
Sodium: 138 mmol/L (ref 135–145)

## 2023-06-06 LAB — CBC
HCT: 36.4 % — ABNORMAL LOW (ref 39.0–52.0)
Hemoglobin: 11.6 g/dL — ABNORMAL LOW (ref 13.0–17.0)
MCH: 30.1 pg (ref 26.0–34.0)
MCHC: 31.9 g/dL (ref 30.0–36.0)
MCV: 94.3 fL (ref 80.0–100.0)
Platelets: 308 10*3/uL (ref 150–400)
RBC: 3.86 MIL/uL — ABNORMAL LOW (ref 4.22–5.81)
RDW: 14 % (ref 11.5–15.5)
WBC: 8.6 10*3/uL (ref 4.0–10.5)
nRBC: 0 % (ref 0.0–0.2)

## 2023-06-06 LAB — TSH: TSH: 0.921 u[IU]/mL (ref 0.350–4.500)

## 2023-06-06 LAB — TROPONIN I (HIGH SENSITIVITY)
Troponin I (High Sensitivity): 37 ng/L — ABNORMAL HIGH (ref <18)
Troponin I (High Sensitivity): 28 ng/L — ABNORMAL HIGH (ref <18)

## 2023-06-06 LAB — MAGNESIUM: Magnesium: 2.2 mg/dL (ref 1.7–2.4)

## 2023-06-06 LAB — HEPARIN LEVEL (UNFRACTIONATED): Heparin Unfractionated: 0.36 [IU]/mL (ref 0.30–0.70)

## 2023-06-06 MED ORDER — ASPIRIN 81 MG PO TBEC
81.0000 mg | DELAYED_RELEASE_TABLET | Freq: Every day | ORAL | Status: DC
  Administered 2023-06-07: 81 mg via ORAL
  Filled 2023-06-06: qty 1

## 2023-06-06 MED ORDER — SODIUM CHLORIDE 0.9 % IV SOLN
250.0000 mL | INTRAVENOUS | Status: AC | PRN
Start: 2023-06-06 — End: 2023-06-07

## 2023-06-06 MED ORDER — METOPROLOL SUCCINATE ER 50 MG PO TB24
50.0000 mg | ORAL_TABLET | Freq: Every day | ORAL | Status: DC
  Administered 2023-06-06 – 2023-06-07 (×2): 50 mg via ORAL
  Filled 2023-06-06 (×2): qty 1

## 2023-06-06 MED ORDER — RENA-VITE PO TABS
1.0000 | ORAL_TABLET | Freq: Every day | ORAL | Status: DC
  Administered 2023-06-06: 1 via ORAL
  Filled 2023-06-06: qty 1

## 2023-06-06 MED ORDER — PERFLUTREN LIPID MICROSPHERE
1.0000 mL | INTRAVENOUS | Status: AC | PRN
  Administered 2023-06-06: 2 mL via INTRAVENOUS

## 2023-06-06 MED ORDER — CLOPIDOGREL BISULFATE 75 MG PO TABS
75.0000 mg | ORAL_TABLET | Freq: Every day | ORAL | Status: DC
  Administered 2023-06-06 – 2023-06-07 (×2): 75 mg via ORAL
  Filled 2023-06-06 (×2): qty 1

## 2023-06-06 MED ORDER — SODIUM CHLORIDE 0.9% FLUSH
3.0000 mL | Freq: Two times a day (BID) | INTRAVENOUS | Status: DC
  Administered 2023-06-06 – 2023-06-07 (×3): 3 mL via INTRAVENOUS

## 2023-06-06 MED ORDER — ATORVASTATIN CALCIUM 40 MG PO TABS
40.0000 mg | ORAL_TABLET | Freq: Every day | ORAL | Status: DC
  Administered 2023-06-06 – 2023-06-07 (×2): 40 mg via ORAL
  Filled 2023-06-06 (×2): qty 1

## 2023-06-06 MED ORDER — AMLODIPINE BESYLATE 2.5 MG PO TABS: 2.5000 mg | ORAL_TABLET | ORAL | Status: DC

## 2023-06-06 MED ORDER — NITROGLYCERIN 0.4 MG SL SUBL: 0.4000 mg | SUBLINGUAL_TABLET | SUBLINGUAL | Status: DC | PRN

## 2023-06-06 MED ORDER — ONDANSETRON HCL 4 MG/2ML IJ SOLN: 4.0000 mg | Freq: Four times a day (QID) | INTRAMUSCULAR | Status: DC | PRN

## 2023-06-06 MED ORDER — SODIUM CHLORIDE 0.9% FLUSH: 3.0000 mL | INTRAVENOUS | Status: DC | PRN

## 2023-06-06 MED ORDER — HEPARIN BOLUS VIA INFUSION
4000.0000 [IU] | Freq: Once | INTRAVENOUS | Status: AC
  Administered 2023-06-06: 4000 [IU] via INTRAVENOUS
  Filled 2023-06-06: qty 4000

## 2023-06-06 MED ORDER — TENAPANOR HCL (CKD) 30 MG PO TABS: 30.0000 mg | ORAL_TABLET | Freq: Two times a day (BID) | ORAL | Status: DC

## 2023-06-06 MED ORDER — ACETAMINOPHEN 325 MG PO TABS: 650.0000 mg | ORAL_TABLET | ORAL | Status: DC | PRN

## 2023-06-06 MED ORDER — HEPARIN (PORCINE) 25000 UT/250ML-% IV SOLN
1200.0000 [IU]/h | INTRAVENOUS | Status: DC
  Administered 2023-06-06: 1200 [IU]/h via INTRAVENOUS
  Filled 2023-06-06 (×2): qty 250

## 2023-06-06 MED ORDER — CALCIUM ACETATE (PHOS BINDER) 667 MG PO CAPS
2668.0000 mg | ORAL_CAPSULE | Freq: Three times a day (TID) | ORAL | Status: DC
  Administered 2023-06-06 – 2023-06-07 (×2): 2668 mg via ORAL
  Filled 2023-06-06 (×2): qty 4

## 2023-06-06 NOTE — ED Notes (Signed)
Pt placed on monitor for echo

## 2023-06-06 NOTE — Progress Notes (Signed)
PHARMACY - ANTICOAGULATION CONSULT NOTE  Pharmacy Consult for heparin infusion Indication: chest pain/ACS  Allergies  Allergen Reactions   Alcohol Rash    Causes skin irritation   Chlorhexidine Itching    Burns skin   Other Other (See Comments)    Local anesthesia (quickly metabolizes)   Tape Other (See Comments)    Adhesive tape (irritates skin)    Patient Measurements: Height: 5\' 9"  (175.3 cm) Weight: 117.9 kg (260 lb) IBW/kg (Calculated) : 70.7 Heparin Dosing Weight: 97.4 kg  Vital Signs: Temp: 97.8 F (36.6 C) (11/20 1158) Temp Source: Oral (11/20 1158) BP: 129/77 (11/20 1351) Pulse Rate: 83 (11/20 1351)  Labs: Recent Labs    06/06/23 1153  HGB 13.2  HCT 42.2  PLT 321  CREATININE 9.25*  TROPONINIHS 37*    Estimated Creatinine Clearance: 11.8 mL/min (A) (by C-G formula based on SCr of 9.25 mg/dL (H)).   Medical History: Past Medical History:  Diagnosis Date   Complication of anesthesia    "benadryl knocks me out; worse than any normal reactions; anesthesia/numbing RX wear off FAST" (01/30/2018) 2021 Took 3 weeks to able talk and eat soild food due to throat    ESRD (end stage renal disease) on dialysis (HCC)    "HAD DIALYSIS BEFORE TRANSPLANT; RESTARTED DIALYSIS 01/30/2018)-  [facility] - MWF   GERD (gastroesophageal reflux disease)    History of blood transfusion    Hypertension    OSA on CPAP    Pneumonia     Assessment: 52yoM chief complaint of chest pain similar to his previous ACS event per patient. On dual antiplatelets with clopidogrel/aspirin outpatient, no anticoagulant noted. hsTnI elevated at 37, on 11/14 was also 37-42. Pharmacy consulted to initiate heparin   Goal of Therapy:  Heparin level 0.3-0.7 units/ml Monitor platelets by anticoagulation protocol: Yes   Plan:  Give 4000 units bolus x 1 Start heparin infusion at 1200 units/hr Check anti-Xa level in 8 hours and daily while on heparin Continue to monitor H&H and platelets  Rutherford Nail, PharmD PGY2 Critical Care Pharmacy Resident 06/06/2023,2:34 PM

## 2023-06-06 NOTE — ED Notes (Signed)
Patient returned from xray.

## 2023-06-06 NOTE — ED Provider Notes (Signed)
[place] EMERGENCY DEPARTMENT AT [facility] Provider Note   CSN: [id number] Arrival date & time: 06/06/23  1143     History  Chief Complaint  Patient presents with   Bradycardia   Chest Pain    Jerry [name] is a 52 y.o. male.  The history is provided by the patient and medical records. No language interpreter was used.  Chest Pain Pain location:  L chest Pain quality: aching, dull and pressure   Pain quality comment:  "like a medium sized child on my chest" Pain radiates to:  L shoulder Pain severity:  Moderate Onset quality:  Gradual Duration:  2 days Timing:  Intermittent Progression:  Waxing and waning Chronicity:  Recurrent Context: at rest   Relieved by:  Nothing Worsened by:  Nothing Ineffective treatments:  None tried Associated symptoms: fatigue and shortness of breath   Associated symptoms: no abdominal pain, no altered mental status, no anorexia, no back pain, no cough, no diaphoresis, no fever, no headache, no lower extremity edema, no nausea, no near-syncope, no numbness, no orthopnea, no palpitations, no vomiting and no weakness   Risk factors: coronary artery disease        Home Medications Prior to Admission medications   Medication Sig Start Date End Date Taking? Authorizing Provider  acetaminophen (TYLENOL) 500 MG tablet Take 1,000 mg by mouth every 6 (six) hours as needed for mild pain.    [provider]  amLODipine (NORVASC) 2.5 MG tablet Take 2.5 mg by mouth at bedtime. 05/09/23   [provider]  aspirin EC 81 MG tablet Take 1 tablet (81 mg total) by mouth daily. Swallow whole. 08/18/22   Tyrone Nine, MD  atorvastatin (LIPITOR) 40 MG tablet Take 40 mg by mouth daily. 03/31/23   [provider]  calcium acetate (PHOSLO) 667 MG capsule Take 1,334-2,668 mg by mouth with breakfast, with lunch, and with evening meal. Take 4 capsules (2,668 mg) by mouth with each meal & take 2 capsules (1,334 mg) by mouth with  snacks 12/01/19   [provider]  clopidogrel (PLAVIX) 75 MG tablet Take 75 mg by mouth daily. 04/01/23   [provider]  metoprolol succinate (TOPROL-XL) 25 MG 24 hr tablet Take 2 tablets (50 mg total) by mouth daily. 05/31/23 08/29/23  Darrick Grinder, PA-C  multivitamin (RENA-VIT) TABS tablet Take 1 tablet by mouth every evening.    [provider]  nitroGLYCERIN (NITROSTAT) 0.4 MG SL tablet Place 1 tablet (0.4 mg total) under the tongue every 5 (five) minutes as needed for chest pain. 05/31/23   Darrick Grinder, PA-C  Tenapanor HCl, CKD, (XPHOZAH) 30 MG TABS Take 30 mg by mouth 2 (two) times daily. 09/20/22   [provider]      Allergies    Alcohol, Chlorhexidine, Other, and Tape    Review of Systems   Review of Systems  Constitutional:  Positive for fatigue. Negative for chills, diaphoresis and fever.  HENT:  Negative for congestion.   Eyes:  Negative for visual disturbance.  Respiratory:  Positive for shortness of breath. Negative for cough, chest tightness and wheezing.   Cardiovascular:  Positive for chest pain. Negative for palpitations, orthopnea, leg swelling and near-syncope.  Gastrointestinal:  Negative for abdominal pain, anorexia, constipation, diarrhea, nausea and vomiting.  Genitourinary:  Negative for dysuria and flank pain.  Musculoskeletal:  Negative for back pain, neck pain and neck stiffness.  Skin:  Negative for rash.  Neurological:  Negative for speech  difficulty, weakness, light-headedness, numbness and headaches.  Psychiatric/Behavioral:  Negative for agitation.   All other systems reviewed and are negative.   Physical Exam Updated Vital Signs BP (!) 122/97   Pulse 88   Temp 97.8 F (36.6 C) (Oral)   Resp 17   Ht 5\' 9"  (1.753 m)   Wt 117.9 kg   SpO2 98%   BMI 38.40 kg/m  Physical Exam Constitutional:      General: He is not in acute distress.    Appearance: He is well-developed. He is not ill-appearing,  toxic-appearing or diaphoretic.  HENT:     Head: Normocephalic and atraumatic.     Right Ear: External ear normal.     Left Ear: External ear normal.     Nose: Nose normal.     Mouth/Throat:     Pharynx: No oropharyngeal exudate.  Eyes:     Conjunctiva/sclera: Conjunctivae normal.     Pupils: Pupils are equal, round, and reactive to light.  Cardiovascular:     Rate and Rhythm: Normal rate and regular rhythm.     Heart sounds: Normal heart sounds. No murmur heard. Pulmonary:     Effort: No respiratory distress.     Breath sounds: No stridor. No decreased breath sounds, wheezing, rhonchi or rales.  Chest:     Chest wall: No tenderness.  Abdominal:     Palpations: Abdomen is soft.     Tenderness: There is no abdominal tenderness. There is no guarding or rebound.  Musculoskeletal:     Cervical back: Normal range of motion and neck supple.     Right lower leg: No tenderness. Edema present.     Left lower leg: No tenderness. Edema present.  Skin:    General: Skin is warm.     Findings: No erythema or rash.  Neurological:     General: No focal deficit present.     Mental Status: He is alert and oriented to person, place, and time.     Cranial Nerves: No cranial nerve deficit.     Motor: No abnormal muscle tone.     Coordination: Coordination normal.     Deep Tendon Reflexes: Reflexes normal.     ED Results / Procedures / Treatments   Labs (all labs ordered are listed, but only abnormal results are displayed) Labs Reviewed  CBC WITH DIFFERENTIAL/PLATELET - Abnormal; Notable for the following components:      Result Value   Eosinophils Absolute 0.7 (*)    All other components within normal limits  COMPREHENSIVE METABOLIC PANEL - Abnormal; Notable for the following components:   BUN 52 (*)    Creatinine, Ser 9.25 (*)    Calcium 7.3 (*)    Total Protein 8.7 (*)    Alkaline Phosphatase 130 (*)    GFR, Estimated 6 (*)    Anion gap 18 (*)    All other components within normal  limits  TROPONIN I (HIGH SENSITIVITY) - Abnormal; Notable for the following components:   Troponin I (High Sensitivity) 37 (*)    All other components within normal limits  MAGNESIUM  TSH  HEPARIN LEVEL (UNFRACTIONATED)  TROPONIN I (HIGH SENSITIVITY)    EKG EKG Interpretation Date/Time:  Wednesday June 06 2023 11:46:31 EST Ventricular Rate:  85 PR Interval:  183 QRS Duration:  96 QT Interval:  409 QTC Calculation: 487 R Axis:   2  Text Interpretation: Sinus rhythm Left atrial enlargement Borderline prolonged QT interval when compared to prior, no longer Bigeminy. No STEMI  Confirmed by Theda Belfast ([id number]) on 06/06/2023 12:11:35 PM  Radiology No results found.  Procedures Procedures    Medications Ordered in ED Medications  aspirin EC tablet 81 mg (has no administration in time range)  nitroGLYCERIN (NITROSTAT) SL tablet 0.4 mg (has no administration in time range)  acetaminophen (TYLENOL) tablet 650 mg (has no administration in time range)  ondansetron (ZOFRAN) injection 4 mg (has no administration in time range)  sodium chloride flush (NS) 0.9 % injection 3 mL (3 mLs Intravenous Given 06/06/23 1453)  sodium chloride flush (NS) 0.9 % injection 3 mL (has no administration in time range)  0.9 %  sodium chloride infusion (has no administration in time range)  amLODipine (NORVASC) tablet 2.5 mg (has no administration in time range)  atorvastatin (LIPITOR) tablet 40 mg (has no administration in time range)  calcium acetate (PHOSLO) capsule 2,668 mg (has no administration in time range)  clopidogrel (PLAVIX) tablet 75 mg (has no administration in time range)  metoprolol succinate (TOPROL-XL) 24 hr tablet 50 mg (has no administration in time range)  multivitamin (RENA-VIT) tablet 1 tablet (has no administration in time range)  Tenapanor HCl (CKD) TABS 30 mg (has no administration in time range)  heparin ADULT infusion 100 units/mL (25000 units/259mL) (1,200 Units/hr  Intravenous New Bag/Given 06/06/23 1504)  perflutren lipid microspheres (DEFINITY) IV suspension (has no administration in time range)  heparin bolus via infusion 4,000 Units (4,000 Units Intravenous Bolus from Bag 06/06/23 1505)    ED Course/ Medical Decision Making/ A&P                                 Medical Decision Making Amount and/or Complexity of Data Reviewed Labs: ordered. Radiology: ordered.  Risk Decision regarding hospitalization.    Jerry [name] is a 52 y.o. male with a past medical history significant for CAD status post PCI, ESRD on dialysis MWF, GERD, and sleep apnea who presents with chest pain, lightheadedness, and bradycardia.  According to patient, for the last week or so he has had chest pain on and off.  He was seen a week ago and was able to go home for outpatient cardiology follow-up.  He reports that over the last 2 days he has had more intermittent chest pressure in his left chest going to his left shoulder that feels similar to when he had to have stent placed in his heart.  He said he does not have the diaphoresis and nausea like he did at that time.  He does report some shortness of breath with it.  He did take a nitro overnight and then during dialysis today, his rate was reportedly in the 30s and blood pressure was soft although I do not know the exact blood pressure at dialysis.  He reports the chest discomfort had worsened in his left chest going to his left shoulder and he was sent here for evaluation.  He reports that discomfort has improved now and he is feeling well.  He denies any recent fevers, chills, congestion, cough.  Denies any nausea, vomiting, constipation, or diarrhea.  He does not make much urine.  Denies any other changes.  He does think that his body was holding onto fluid over the last few weeks and he reports this is what strained his heart last time before he had his heart attack.  He reports no other sick contacts.  Not having pain  now.  On exam, lungs  were clear.  Chest is nontender.  I do not appreciate a murmur.  Abdomen nontender.  Good pulses in extremities.  Patient resting comfortably without distress.  EKG showed a sinus rhythm.  He had bigeminy last week when he was seen and I suspect that the heart rate in the 30s at dialysis was likely recurrent bigeminy.  He does not report palpitations with it but was having the discomfort earlier.  No STEMI seen.  Clinically I do suspect we will need to discuss with cardiology given intermittent chest pain for the last week worsening today during dialysis that felt similar to his previous MI.  Will get screening labs, chest x-ray, and then will call cardiology for recommendations.  2:10 PM Spoke to cardiology who will come see the patient to help determine disposition.  Initial troponin appears unchanged from last week.  Still waiting on results of chest x-ray and delta troponin.  Anticipate follow-up on cardiology recommendations.  Cardiology will see and admit for further management.        Final Clinical Impression(s) / ED Diagnoses Final diagnoses:  Atypical chest pain     Clinical Impression: 1. Atypical chest pain     Disposition: Admit  This note was prepared with assistance of Dragon voice recognition software. Occasional wrong-word or sound-a-like substitutions may have occurred due to the inherent limitations of voice recognition software.       Joanna Hall, Canary Brim, MD 06/06/23 [phone]

## 2023-06-06 NOTE — Progress Notes (Addendum)
Patient arrived to room 6E26 in NAD, VS stable, patient free from pain. Patient oriented to room and call bell in reach. Patient has phone and computer at bedside. 1 ring and glasses on patient.   Right chest HD cath in place. No antimicrobial disc present and dressing is only paper tape per patient's request. He says he is allergic to all other dressings.

## 2023-06-06 NOTE — H&P (Signed)
Referring Physician: M. Sharyn Lull, MD/Tegeler, C, MD   Jerry [name] is an 52 y.o. male.                       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 EKG showed NSR. Chest x-ray is unremarkable. Troponin I is minimally elevated. CBC and electrolytes are normal. Creatinine is 9.25 mg/dL.  Past Medical History:  Diagnosis Date   Complication of anesthesia    "benadryl knocks me out; worse than any normal reactions; anesthesia/numbing RX wear off FAST" (01/30/2018) 2021 Took 3 weeks to able talk and eat soild food due to throat    ESRD (end stage renal disease) on dialysis (HCC)    "HAD DIALYSIS BEFORE TRANSPLANT; RESTARTED DIALYSIS 01/30/2018)-  [facility] - MWF   GERD (gastroesophageal reflux disease)    History of blood transfusion    Hypertension    OSA on CPAP    Pneumonia       Past Surgical History:  Procedure Laterality Date   A/V FISTULAGRAM N/A 02/19/2020   Procedure: A/V FISTULAGRAM;  Surgeon: Nada Libman, MD;  [facility];  Service: Cardiovascular;  Laterality: N/A;   AV FISTULA PLACEMENT Right 2014   AV FISTULA PLACEMENT Right 12/26/2019   Procedure: Repair and Revision of Right Forearm AV Fistula;  Surgeon: Larina Earthly, MD;  Location: [facility] OR;  Service: Vascular;  Laterality: Right;   AV FISTULA PLACEMENT Left 07/29/2020   Procedure: LEFT ARM BRACHIOCEPHALIC ARTERIOVENOUS (AV) FISTULA CREATION;  Surgeon: Maeola Harman, MD;  Location: [facility] OR;  Service: Vascular;  Laterality: Left;   BASCILIC VEIN TRANSPOSITION Left 11/09/2020   Procedure: LEFT BRACHIOCEPHALIC ARTERIOVENOUS FISTULA TRANSPOSITION;  Surgeon: Maeola Harman, MD;  Location: [facility] OR;  Service: Vascular;  Laterality: Left;   CORONARY STENT  INTERVENTION N/A 08/21/2022   Procedure: CORONARY STENT INTERVENTION;  Surgeon: Yates Decamp, MD;  [facility];  Service: Cardiovascular;  Laterality: N/A;   INSERTION OF DIALYSIS CATHETER Left 02/02/2020   Procedure: INSERTION OF LEFT INTERNAL JUGULAR 28 CM TUNNELED DIALYSIS CATHETER UNDER ULTRASOUND GUIDANCE;  Surgeon: Cephus Shelling, MD;  [facility];  Service: Vascular;  Laterality: Left;   KIDNEY TRANSPLANT  2014   at [facility]   LEFT HEART CATH AND CORONARY ANGIOGRAPHY N/A 08/21/2022   Procedure: LEFT HEART CATH AND CORONARY ANGIOGRAPHY;  Surgeon: Orpah Cobb, MD;  [facility];  Service: Cardiovascular;  Laterality: N/A;   LIGATION OF COMPETING BRANCHES OF ARTERIOVENOUS FISTULA Left 11/09/2020   Procedure: LIGATION OF COMPETING BRANCHES OF LEFT BRACHIOCEPHALIC ARTERIOVENOUS FISTULA;  Surgeon: Maeola Harman, MD;  Location: [facility] OR;  Service: Vascular;  Laterality: Left;   PARATHYROIDECTOMY  2014   at [facility]   PERIPHERAL VASCULAR INTERVENTION Right 02/19/2020   Procedure: PERIPHERAL VASCULAR INTERVENTION;  Surgeon: Nada Libman, MD;  [facility];  Service: Cardiovascular;  Laterality: Right;   PERIPHERAL VASCULAR INTERVENTION Right 08/21/2022   Procedure: PERIPHERAL VASCULAR INTERVENTION;  Surgeon: Yates Decamp, MD;  [facility];  Service: Cardiovascular;  Laterality: Right;   REVISON OF ARTERIOVENOUS FISTULA Right 05/22/2020   Procedure: REPAIR OF RIGHT ARTERIOVENOUS FISTULA;  Surgeon: Maeola Harman, MD;  [facility];  Service: Vascular;  Laterality: Right;   UPPER EXTREMITY VENOGRAPHY Bilateral 04/09/2023   Procedure: UPPER EXTREMITY VENOGRAPHY;  Surgeon: Maeola Harman, MD;  Location: [facility] INVASIVE CV LAB;  Service: Cardiovascular;  Laterality: Bilateral;    Family History  Problem Relation Age of Onset   Heart failure Mother    Kidney failure Father    Social History:  reports that he has  quit smoking. His smoking use included cigars. He has never used smokeless tobacco. He reports current alcohol use of about 1.0 standard drink of alcohol per week. He reports that he does not currently use drugs.  Allergies:  Allergies  Allergen Reactions   Alcohol Rash    Causes skin irritation   Chlorhexidine Itching    Burns skin   Other Other (See Comments)    Local anesthesia (quickly metabolizes)   Tape Other (See Comments)    Adhesive tape (irritates skin)    (Not in a hospital admission)   Results for orders placed or performed during the hospital encounter of 06/06/23 (from the past 48 hour(s))  CBC with Differential     Status: Abnormal   Collection Time: 06/06/23 11:53 AM  Result Value Ref Range   WBC 7.0 4.0 - 10.5 K/uL   RBC 4.42 4.22 - 5.81 MIL/uL   Hemoglobin 13.2 13.0 - 17.0 g/dL   HCT 16.1 09.6 - 04.5 %   MCV 95.5 80.0 - 100.0 fL   MCH 29.9 26.0 - 34.0 pg   MCHC 31.3 30.0 - 36.0 g/dL   RDW 40.9 81.1 - 91.4 %   Platelets 321 150 - 400 K/uL   nRBC 0.0 0.0 - 0.2 %   Neutrophils Relative % 68 %   Neutro Abs 4.7 1.7 - 7.7 K/uL   Lymphocytes Relative 11 %   Lymphs Abs 0.8 0.7 - 4.0 K/uL   Monocytes Relative 9 %   Monocytes Absolute 0.6 0.1 - 1.0 K/uL   Eosinophils Relative 10 %   Eosinophils Absolute 0.7 (H) 0.0 - 0.5 K/uL   Basophils Relative 1 %   Basophils Absolute 0.1 0.0 - 0.1 K/uL   Immature Granulocytes 1 %   Abs Immature Granulocytes 0.04 0.00 - 0.07 K/uL    Comment: Performed at [facility] Lab, [address], [place], [place] [address]  Comprehensive metabolic panel     Status: Abnormal   Collection Time: 06/06/23 11:53 AM  Result Value Ref Range   Sodium 139 135 - 145 mmol/L   Potassium 4.1 3.5 - 5.1 mmol/L   Chloride 99 98 - 111 mmol/L   CO2 22 22 - 32 mmol/L   Glucose, Bld 93 70 - 99 mg/dL    Comment: Glucose reference range applies only to samples taken after fasting for at least 8 hours.   BUN 52 (H) 6 - 20 mg/dL   Creatinine, Ser  6.21 (H) 0.61 - 1.24 mg/dL   Calcium 7.3 (L) 8.9 - 10.3 mg/dL   Total Protein 8.7 (H) 6.5 - 8.1 g/dL   Albumin 4.3 3.5 - 5.0 g/dL   AST 23 15 - 41 U/L   ALT 22 0 - 44 U/L   Alkaline Phosphatase 130 (H) 38 - 126 U/L   Total Bilirubin 0.3 <1.2 mg/dL   GFR, Estimated 6 (L) >60 mL/min    Comment: (NOTE) Calculated using the CKD-EPI Creatinine Equation (2021)    Anion gap 18 (H) 5 - 15    Comment: Performed at [facility], [address] [place], [place] [address]  Troponin I (High Sensitivity)     Status: Abnormal   Collection Time: 06/06/23 11:53 AM  Result Value Ref Range   Troponin I (High Sensitivity) 37 (H) <18 ng/L    Comment: (NOTE) Elevated high sensitivity troponin I (hsTnI) values and significant  changes across serial measurements may suggest ACS but many other  chronic and acute conditions are known to elevate hsTnI results.  Refer to the "Links" section for chest pain algorithms and additional  guidance. Performed at [facility] Lab, [address], [place], [place] [address]   Magnesium     Status: None   Collection Time: 06/06/23 11:53 AM  Result Value Ref Range   Magnesium 2.2 1.7 - 2.4 mg/dL    Comment: Performed at [facility] Lab, [address], [place], [place] [address]  TSH     Status: None   Collection Time: 06/06/23 11:53 AM  Result Value Ref Range   TSH 0.921 0.350 - 4.500 uIU/mL    Comment: Performed by a 3rd Generation assay with a functional sensitivity of <=0.01 uIU/mL. Performed at [facility] Lab, [address] [place], [place] [address]    No results found.  Review Of Systems Constitutional: No fever, chills, weight loss or gain. Eyes: No vision change, wears glasses. No discharge or pain. Ears: No hearing loss, No tinnitus. Respiratory: No asthma, COPD, pneumonias. Positive, shortness of breath. No hemoptysis. Cardiovascular: Positive chest pain, no palpitation, leg edema. Gastrointestinal: No nausea, vomiting, diarrhea,  constipation. No GI bleed. No hepatitis. Genitourinary: No dysuria, hematuria, kidney stone. No incontinance. Neurological: No headache, stroke, seizures.  Psychiatry: No psych facility admission for anxiety, depression, suicide. No detox. Skin: No rash. Musculoskeletal: No joint pain, fibromyalgia. No neck pain, back pain. Lymphadenopathy: No lymphadenopathy. Hematology: Mild anemia, No easy bruising.   Blood pressure 129/77, pulse 83, temperature 97.8 F (36.6 C), temperature source Oral, resp. rate 17, height 5\' 9"  (1.753 m), weight 117.9 kg, SpO2 99%. Body mass index is 38.4 kg/m. General appearance: alert, cooperative, appears stated age and mild distress Head: Normocephalic, atraumatic. Eyes: Brown eyes, pink conjunctiva, corneas clear.  Neck: No adenopathy, no carotid bruit, no JVD, supple, symmetrical, trachea midline and thyroid not enlarged. Resp: Clear to auscultation bilaterally. Cardio: Regular rate and rhythm, S1, S2 normal, II/VI systolic murmur, no click, rub or gallop GI: Soft, non-tender; bowel sounds normal; no organomegaly. Extremities: No edema, cyanosis or clubbing. Left IJ vascular access. Non functioning AV fistulas right and left upper arm. Skin: Warm and dry.  Neurologic: Alert and oriented X 3, normal strength. Normal coordination.  Assessment/Plan Acute coronary syndrome ESRD Obesity HTN CAD S/P RCA stent  Plan: IV heparin Offered cardiac cath v/s nuclear stress test. Patient prefer nuclear stress test. Renal consult. Home medications. Echocardiogram  Time spent: Review of old records, Lab, x-rays, EKG, other cardiac tests, examination, discussion with patient/Wife/Doctor over 70 minutes.  Ricki Rodriguez, MD  06/06/2023, 2:33 PM

## 2023-06-06 NOTE — ED Notes (Signed)
ED TO INPATIENT HANDOFF REPORT  ED Nurse Name and Phone #: Delorise Jackson, 52  S Name/Age/Gender Jerry [name] 52 y.o. male Room/Bed: 018C/018C  Code Status   Code Status: Full Code  Home/SNF/Other Home Patient oriented to: self, place, time, and situation Is this baseline? Yes   Triage Complete: Triage complete  Chief Complaint Acute coronary syndrome [facility]) [I24.9]  Triage Note Pt experienced chest pain this morning around 0430. Pt took nitroglycerin that relieved the pain. Hx of angina. Pt was at dialysis when heart rate dropped to the 30s and became hypotensive. Was unable to complete dialysis. Pt denies chest pain at this time.   EMS VS 83 HR 99% RA 1 liter 116/70 123 CBG  EMS EKG showed Bigeminy that went back to NSR   Allergies Allergies  Allergen Reactions   Alcohol Rash    Causes skin irritation   Chlorhexidine Itching    Burns skin   Other Other (See Comments)    Local anesthesia (quickly metabolizes)   Tape Other (See Comments)    Adhesive tape (irritates skin)    Level of Care/Admitting Diagnosis ED Disposition     ED Disposition  Admit   Condition  --   Comment  Hospital Area: [facility] [[id number]]  Level of Care: Telemetry Cardiac [[id number]]  May place patient in observation at [facility] or [facility] if equivalent level of care is available:: No  Covid Evaluation: Asymptomatic - no recent exposure (last 10 days) testing not required  Diagnosis: Acute coronary syndrome [facility]) [[id number]]  Admitting Physician: Orpah Cobb [[id number]]  Attending Physician: Orpah Cobb [[id number]]          B Medical/Surgery History Past Medical History:  Diagnosis Date   Complication of anesthesia    "benadryl knocks me out; worse than any normal reactions; anesthesia/numbing RX wear off FAST" (01/30/2018) 2021 Took 3 weeks to able talk and eat soild food due to throat    ESRD (end stage renal disease) on dialysis (HCC)    "HAD DIALYSIS BEFORE  TRANSPLANT; RESTARTED DIALYSIS 01/30/2018)-  [facility] - MWF   GERD (gastroesophageal reflux disease)    History of blood transfusion    Hypertension    OSA on CPAP    Pneumonia    Past Surgical History:  Procedure Laterality Date   A/V FISTULAGRAM N/A 02/19/2020   Procedure: A/V FISTULAGRAM;  Surgeon: Nada Libman, MD;  [facility];  Service: Cardiovascular;  Laterality: N/A;   AV FISTULA PLACEMENT Right 2014   AV FISTULA PLACEMENT Right 12/26/2019   Procedure: Repair and Revision of Right Forearm AV Fistula;  Surgeon: Larina Earthly, MD;  Location: [facility] OR;  Service: Vascular;  Laterality: Right;   AV FISTULA PLACEMENT Left 07/29/2020   Procedure: LEFT ARM BRACHIOCEPHALIC ARTERIOVENOUS (AV) FISTULA CREATION;  Surgeon: Maeola Harman, MD;  Location: [facility] OR;  Service: Vascular;  Laterality: Left;   BASCILIC VEIN TRANSPOSITION Left 11/09/2020   Procedure: LEFT BRACHIOCEPHALIC ARTERIOVENOUS FISTULA TRANSPOSITION;  Surgeon: Maeola Harman, MD;  Location: [facility] OR;  Service: Vascular;  Laterality: Left;   CORONARY STENT INTERVENTION N/A 08/21/2022   Procedure: CORONARY STENT INTERVENTION;  Surgeon: Yates Decamp, MD;  [facility];  Service: Cardiovascular;  Laterality: N/A;   INSERTION OF DIALYSIS CATHETER Left 02/02/2020   Procedure: INSERTION OF LEFT INTERNAL JUGULAR 28 CM TUNNELED DIALYSIS CATHETER UNDER ULTRASOUND GUIDANCE;  Surgeon: Cephus Shelling, MD;  [facility];  Service: Vascular;  Laterality: Left;  KIDNEY TRANSPLANT  2014   at [facility]   LEFT HEART CATH AND CORONARY ANGIOGRAPHY N/A 08/21/2022   Procedure: LEFT HEART CATH AND CORONARY ANGIOGRAPHY;  Surgeon: Orpah Cobb, MD;  [facility];  Service: Cardiovascular;  Laterality: N/A;   LIGATION OF COMPETING BRANCHES OF ARTERIOVENOUS FISTULA Left 11/09/2020   Procedure: LIGATION OF COMPETING BRANCHES OF LEFT BRACHIOCEPHALIC ARTERIOVENOUS FISTULA;  Surgeon: Maeola Harman, MD;  Location: [facility] OR;  Service: Vascular;  Laterality: Left;   PARATHYROIDECTOMY  2014   at [facility]   PERIPHERAL VASCULAR INTERVENTION Right 02/19/2020   Procedure: PERIPHERAL VASCULAR INTERVENTION;  Surgeon: Nada Libman, MD;  [facility];  Service: Cardiovascular;  Laterality: Right;   PERIPHERAL VASCULAR INTERVENTION Right 08/21/2022   Procedure: PERIPHERAL VASCULAR INTERVENTION;  Surgeon: Yates Decamp, MD;  [facility];  Service: Cardiovascular;  Laterality: Right;   REVISON OF ARTERIOVENOUS FISTULA Right 05/22/2020   Procedure: REPAIR OF RIGHT ARTERIOVENOUS FISTULA;  Surgeon: Maeola Harman, MD;  Location: [facility] OR;  Service: Vascular;  Laterality: Right;   UPPER EXTREMITY VENOGRAPHY Bilateral 04/09/2023   Procedure: UPPER EXTREMITY VENOGRAPHY;  Surgeon: Maeola Harman, MD;  Location: [facility] INVASIVE CV LAB;  Service: Cardiovascular;  Laterality: Bilateral;     A IV Location/Drains/Wounds Patient Lines/Drains/Airways Status     Active Line/Drains/Airways     Name Placement date Placement time Site Days   Fistula / Graft Right Forearm Arteriovenous fistula --   --  Forearm  --   Fistula / Graft Right Forearm 02/03/20  1935  Forearm  1219   Fistula / Graft Left Upper arm Arteriovenous fistula 07/29/20  0832  Upper arm  1042   Hemodialysis Catheter Left Internal jugular Double lumen Permanent (Tunneled) 02/02/20  1658  Internal jugular  1220            Intake/Output Last 24 hours No intake or output data in the 24 hours ending 06/06/23 1434  Labs/Imaging Results for orders placed or performed during the hospital encounter of 06/06/23 (from the past 48 hour(s))  CBC with Differential     Status: Abnormal   Collection Time: 06/06/23 11:53 AM  Result Value Ref Range   WBC 7.0 4.0 - 10.5 K/uL   RBC 4.42 4.22 - 5.81 MIL/uL   Hemoglobin 13.2 13.0 - 17.0 g/dL   HCT 13.0 86.5 - 78.4 %   MCV 95.5 80.0 - 100.0 fL   MCH 29.9  26.0 - 34.0 pg   MCHC 31.3 30.0 - 36.0 g/dL   RDW 69.6 29.5 - 28.4 %   Platelets 321 150 - 400 K/uL   nRBC 0.0 0.0 - 0.2 %   Neutrophils Relative % 68 %   Neutro Abs 4.7 1.7 - 7.7 K/uL   Lymphocytes Relative 11 %   Lymphs Abs 0.8 0.7 - 4.0 K/uL   Monocytes Relative 9 %   Monocytes Absolute 0.6 0.1 - 1.0 K/uL   Eosinophils Relative 10 %   Eosinophils Absolute 0.7 (H) 0.0 - 0.5 K/uL   Basophils Relative 1 %   Basophils Absolute 0.1 0.0 - 0.1 K/uL   Immature Granulocytes 1 %   Abs Immature Granulocytes 0.04 0.00 - 0.07 K/uL    Comment: Performed at [facility] Lab, [address] [place], [place] [address]  Comprehensive metabolic panel     Status: Abnormal   Collection Time: 06/06/23 11:53 AM  Result Value Ref Range   Sodium 139 135 - 145  mmol/L   Potassium 4.1 3.5 - 5.1 mmol/L   Chloride 99 98 - 111 mmol/L   CO2 22 22 - 32 mmol/L   Glucose, Bld 93 70 - 99 mg/dL    Comment: Glucose reference range applies only to samples taken after fasting for at least 8 hours.   BUN 52 (H) 6 - 20 mg/dL   Creatinine, Ser 6.04 (H) 0.61 - 1.24 mg/dL   Calcium 7.3 (L) 8.9 - 10.3 mg/dL   Total Protein 8.7 (H) 6.5 - 8.1 g/dL   Albumin 4.3 3.5 - 5.0 g/dL   AST 23 15 - 41 U/L   ALT 22 0 - 44 U/L   Alkaline Phosphatase 130 (H) 38 - 126 U/L   Total Bilirubin 0.3 <1.2 mg/dL   GFR, Estimated 6 (L) >60 mL/min    Comment: (NOTE) Calculated using the CKD-EPI Creatinine Equation (2021)    Anion gap 18 (H) 5 - 15    Comment: Performed at [facility] Lab, [address] [place], [place] [address]  Troponin I (High Sensitivity)     Status: Abnormal   Collection Time: 06/06/23 11:53 AM  Result Value Ref Range   Troponin I (High Sensitivity) 37 (H) <18 ng/L    Comment: (NOTE) Elevated high sensitivity troponin I (hsTnI) values and significant  changes across serial measurements may suggest ACS but many other  chronic and acute conditions are known to elevate hsTnI results.  Refer to the "Links"  section for chest pain algorithms and additional  guidance. Performed at [facility] Lab, [address], [place], [place] [address]   Magnesium     Status: None   Collection Time: 06/06/23 11:53 AM  Result Value Ref Range   Magnesium 2.2 1.7 - 2.4 mg/dL    Comment: Performed at [facility] Lab, [address] [place], [place] [address]  TSH     Status: None   Collection Time: 06/06/23 11:53 AM  Result Value Ref Range   TSH 0.921 0.350 - 4.500 uIU/mL    Comment: Performed by a 3rd Generation assay with a functional sensitivity of <=0.01 uIU/mL. Performed at [facility] Lab, [address], [place], [place] [address]    No results found.  Pending Labs Unresulted Labs (From admission, onward)     Start     Ordered   06/07/23 0500  Lipid panel  Tomorrow morning,   R        06/06/23 1430   06/07/23 0500  CBC  Tomorrow morning,   R        06/06/23 1430   06/07/23 0500  Basic metabolic panel  Tomorrow morning,   R        06/06/23 1430            Vitals/Pain Today's Vitals   06/06/23 1158 06/06/23 1200 06/06/23 1215 06/06/23 1351  BP:  91/66  129/77  Pulse:  87 84 83  Resp:  (!) 22 20 17   Temp: 97.8 F (36.6 C)     TempSrc: Oral     SpO2:  97% 98% 99%  Weight:      Height:      PainSc:        Isolation Precautions No active isolations  Medications Medications  aspirin EC tablet 81 mg (has no administration in time range)  nitroGLYCERIN (NITROSTAT) SL tablet 0.4 mg (has no administration in time range)  acetaminophen (TYLENOL) tablet 650 mg (has no administration in time range)  ondansetron (ZOFRAN) injection 4 mg (has no administration in time range)  sodium chloride flush (NS) 0.9 % injection 3 mL (has no administration in time range)  sodium chloride flush (NS) 0.9 % injection 3 mL (has no administration in time range)  0.9 %  sodium chloride infusion (has no administration in time range)  amLODipine (NORVASC) tablet 2.5 mg (has no administration  in time range)  atorvastatin (LIPITOR) tablet 40 mg (has no administration in time range)  calcium acetate (PHOSLO) capsule [phone] mg (has no administration in time range)  clopidogrel (PLAVIX) tablet 75 mg (has no administration in time range)  metoprolol succinate (TOPROL-XL) 24 hr tablet 50 mg (has no administration in time range)  multivitamin (RENA-VIT) tablet 1 tablet (has no administration in time range)  Tenapanor HCl (CKD) TABS 30 mg (has no administration in time range)    Mobility walks     Focused Assessments Cardiac Assessment Handoff:  Cardiac Rhythm: Normal sinus rhythm Lab Results  Component Value Date   CKTOTAL 268 01/29/2018   TROPONINI 0.08 (HH) 01/30/2018   No results found for: "DDIMER" Does the Patient currently have chest pain? No    R Recommendations: See Admitting Provider Note  Report given to:   Additional Notes PT was unable to finish dialysis

## 2023-06-06 NOTE — ED Triage Notes (Signed)
Pt experienced chest pain this morning around 0430. Pt took nitroglycerin that relieved the pain. Hx of angina. Pt was at dialysis when heart rate dropped to the 30s and became hypotensive. Was unable to complete dialysis. Pt denies chest pain at this time.   EMS VS 83 HR 99% RA 1 liter 116/70 123 CBG  EMS EKG showed Bigeminy that went back to NSR

## 2023-06-06 NOTE — ED Notes (Signed)
Patient transported to X-Ray 

## 2023-06-07 ENCOUNTER — Observation Stay (HOSPITAL_COMMUNITY): Payer: Medicare Other

## 2023-06-07 LAB — LIPID PANEL
Cholesterol: 93 mg/dL (ref 0–200)
HDL: 35 mg/dL — ABNORMAL LOW (ref >40)
LDL Cholesterol: 20 mg/dL (ref 0–99)
Total CHOL/HDL Ratio: 2.7 {ratio}
Triglycerides: 191 mg/dL — ABNORMAL HIGH (ref <150)
VLDL: 38 mg/dL (ref 0–40)

## 2023-06-07 MED ORDER — TECHNETIUM TC 99M TETROFOSMIN IV KIT
32.7000 | PACK | Freq: Once | INTRAVENOUS | Status: AC | PRN
  Administered 2023-06-07: 32.7 via INTRAVENOUS

## 2023-06-07 MED ORDER — REGADENOSON 0.4 MG/5ML IV SOLN
0.4000 mg | Freq: Once | INTRAVENOUS | Status: AC
  Administered 2023-06-07: 0.4 mg via INTRAVENOUS
  Filled 2023-06-07: qty 5

## 2023-06-07 MED ORDER — TECHNETIUM TC 99M TETROFOSMIN IV KIT
10.9000 | PACK | Freq: Once | INTRAVENOUS | Status: AC | PRN
  Administered 2023-06-07: 10.9 via INTRAVENOUS

## 2023-06-07 MED ORDER — REGADENOSON 0.4 MG/5ML IV SOLN
INTRAVENOUS | Status: AC
  Filled 2023-06-07: qty 5

## 2023-06-07 NOTE — Care Management Obs Status (Signed)
MEDICARE OBSERVATION STATUS NOTIFICATION   Patient Details  Name: Mekhai Gaymon MRN: [id number] Date of Birth: 1970-11-20   Medicare Observation Status Notification Given:  Yes    Lockie Pares, RN 06/07/2023, 2:57 PM

## 2023-06-07 NOTE — Discharge Summary (Signed)
Physician Discharge Summary  Patient ID: Jerry [name] MRN: [id number] DOB/AGE: [birth year] 52 y.o.  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 EKG showed NSR. Chest x-ray was unremarkable. Troponin I is minimally elevated. CBC and electrolytes are normal. Creatinine is 9.25 mg/dL. NM myocardial perfusion stress test was negative for reversible ischemia. Echocardiogram showed preserved LV systolic function. He will see Kidney doctors and dialysis units as arranged and see me in 1 week.   Consults: cardiology  Significant Diagnostic Studies: labs: Normal CBC and electrolytes. Elevated creatinine of 9.25 mg/dL.  EKG: NSR.  Chest x-ray: Unremarkable.  Echocardiogram: Low normal LV systolic function.  NM Myocardial perfusion stress test : Negative for reversible ischemia.  Treatments: cardiac meds: Aspirin, Clopidogrel, Metoprolol, SL NTG and Atorvastatin  Discharge Exam: Blood pressure 117/71, pulse 74, temperature 98.5 F (36.9 C), temperature source Oral, resp. rate 16, height 5\' 9"  (1.753 m), weight 75.5 kg, SpO2 100%. General appearance: alert, cooperative and appears stated age. Head: Normocephalic, atraumatic. Eyes: Brown eyes, pink conjunctiva, corneas clear.   Neck: No adenopathy, no carotid bruit, no JVD, supple, symmetrical, trachea midline and thyroid not enlarged. Resp: Clear to auscultation bilaterally. Cardio: Regular rate and rhythm, S1, S2 normal,  II/VI systolic murmur, no click, rub or gallop. GI: Soft, non-tender; bowel sounds normal; no organomegaly. Extremities: No edema, cyanosis or clubbing. Left IJ dialysis catheter. Occluded bilateral upper arm AV fistulas. Skin: Warm and dry.  Neurologic: Alert and oriented X 3, normal strength and tone. Normal coordination and gait.  Disposition: Discharge disposition: 01-Home or Self Care        Allergies as of 06/07/2023       Reactions   Alcohol Rash   Causes skin irritation   Chlorhexidine Itching   Burns skin   Other Other (See Comments)   Local anesthesia (quickly metabolizes)   Tape Other (See Comments)   Adhesive tape (irritates skin)        Medication List     TAKE these medications    acetaminophen 500 MG tablet Commonly known as: TYLENOL Take 1,000 mg by mouth every 6 (six) hours as needed for mild pain.   amLODipine 2.5 MG tablet Commonly known as: NORVASC Take 2.5 mg by mouth every Monday, Wednesday, and Friday. What changed: Another medication with the same name was removed. Continue taking this medication, and follow the directions you see here.   aspirin EC 81 MG tablet Take 1 tablet (81 mg total) by mouth daily. Swallow whole.   atorvastatin 40 MG tablet Commonly known as: LIPITOR Take 40 mg by mouth daily.   calcium acetate 667 MG capsule Commonly known as: PHOSLO Take 1,334-2,668 mg by mouth with breakfast, with lunch, and with evening meal. Take 4 capsules (2,668 mg) by mouth with each meal & take 2 capsules (1,334 mg) by mouth with snacks   clopidogrel 75 MG tablet Commonly known as: PLAVIX Take 75 mg by mouth daily.   metoprolol succinate 25 MG 24 hr tablet Commonly known as: TOPROL-XL Take 2  tablets (50 mg total) by mouth daily.   multivitamin Tabs tablet Take 1 tablet by mouth every evening.   nitroGLYCERIN 0.4 MG SL tablet Commonly known as: NITROSTAT Place 1 tablet (0.4 mg total) under the tongue every 5 (five) minutes as  needed for chest pain.   Xphozah 30 MG Tabs Generic drug: Tenapanor HCl (CKD) Take 30 mg by mouth 2 (two) times daily.        Follow-up Information     Pa, [facility]. Schedule an appointment as soon as possible for a visit.   Why: As arranged Contact information: [address] [place] [address] [phone]         Orpah Cobb, MD Follow up in 1 week(s).   Specialty: Cardiology Contact information: [address] [place] [address] [phone]                 Time spent: Review of old chart, current chart, lab, x-ray, cardiac tests and discussion with patient over 60 minutes.  Signed: Ricki Rodriguez 06/07/2023, 4:18 PM

## 2023-06-07 NOTE — Progress Notes (Signed)
PHARMACY - ANTICOAGULATION CONSULT NOTE  Pharmacy Consult for heparin Indication: chest pain/ACS  Labs: Recent Labs    06/06/23 1153 06/06/23 1450 06/06/23 2325  HGB 13.2  --  11.6*  HCT 42.2  --  36.4*  PLT 321  --  308  HEPARINUNFRC  --   --  0.36  CREATININE 9.25*  --  11.89*  TROPONINIHS 37* 28*  --    Assessment/Plan:  52yo male therapeutic on heparin with initial dosing for ACS. Will continue infusion at current rate of 1200 units/hr and confirm stable with am labs.  Vernard Gambles, PharmD, BCPS 06/07/2023 12:04 AM

## 2023-06-07 NOTE — Care Management (Signed)
  Transition of Care [facility]) Screening Note   Patient Details  Name: Jerry [name] Date of Birth: 1970-11-01   Transition of Care [facility]) CM/SW Contact:    Lockie Pares, RN Phone Number: 06/07/2023, 3:18 PM    Transition of Care Department [facility]) has reviewed patient and no TOC needs have been identified at this time. We will continue to monitor patient advancement through interdisciplinary progression rounds. If new patient transition needs arise, please place a TOC consult. Patient denies any needs when DC planning discussed

## 2023-06-08 ENCOUNTER — Ambulatory Visit (HOSPITAL_COMMUNITY)
Admission: RE | Admit: 2023-06-08 | Discharge: 2023-06-08 | Disposition: A | Discharge status: Home / Self Care | Payer: Medicare Other | Source: Ambulatory Visit | Attending: Vascular Surgery | Admitting: Vascular Surgery

## 2023-06-11 ENCOUNTER — Ambulatory Visit (HOSPITAL_COMMUNITY)
Admission: RE | Admit: 2023-06-11 | Discharge: 2023-06-11 | Disposition: A | Discharge status: Home / Self Care | Payer: Medicare Other | Source: Ambulatory Visit | Attending: Vascular Surgery | Admitting: Vascular Surgery

## 2023-06-11 DIAGNOSIS — N186 End stage renal disease: Secondary | ICD-10-CM | POA: Insufficient documentation

## 2023-06-11 LAB — VAS US ABI WITH/WO TBI
Left ABI: 0.9
Right ABI: 0.9

## 2023-06-13 ENCOUNTER — Encounter: Payer: Self-pay | Admitting: Vascular Surgery

## 2023-06-13 ENCOUNTER — Ambulatory Visit: Payer: Medicare Other | Admitting: Vascular Surgery

## 2023-06-13 VITALS — BP 107/78 | HR 47 | Temp 98.1°F | Ht 69.0 in | Wt 260.5 lb

## 2023-06-13 DIAGNOSIS — N186 End stage renal disease: Secondary | ICD-10-CM

## 2023-06-13 NOTE — Progress Notes (Signed)
Patient ID: Jerry [name], male   DOB: [birth year], 52 y.o.   MRN: [id number]  Reason for Consult: Routine Post Op   Referred by Annie Sable, MD  Subjective:     HPI:  Jerry [name] is a 52 y.o. male end-stage renal disease on dialysis via left IJ catheter which she has been on for over a year.  He has a previous bilateral upper extremity access surgeries and recently underwent venography for evaluation of further upper extremity access surgery.  He is not having any problems with the catheter at this time and specifically denies fevers or chills.    Past Medical History:  Diagnosis Date   Complication of anesthesia    "benadryl knocks me out; worse than any normal reactions; anesthesia/numbing RX wear off FAST" (01/30/2018) 2021 Took 3 weeks to able talk and eat soild food due to throat    ESRD (end stage renal disease) on dialysis (HCC)    "HAD DIALYSIS BEFORE TRANSPLANT; RESTARTED DIALYSIS 01/30/2018)-  [facility] - MWF   GERD (gastroesophageal reflux disease)    History of blood transfusion    Hypertension    OSA on CPAP    Peripheral arterial disease (HCC)    Pneumonia    Family History  Problem Relation Age of Onset   Heart failure Mother    Kidney failure Father    Past Surgical History:  Procedure Laterality Date   A/V FISTULAGRAM N/A 02/19/2020   Procedure: A/V FISTULAGRAM;  Surgeon: Nada Libman, MD;  Location: [facility] INVASIVE CV LAB;  Service: Cardiovascular;  Laterality: N/A;   AV FISTULA PLACEMENT Right 2014   AV FISTULA PLACEMENT Right 12/26/2019   Procedure: Repair and Revision of Right Forearm AV Fistula;  Surgeon: Larina Earthly, MD;  Location: [facility] OR;  Service: Vascular;  Laterality: Right;   AV FISTULA PLACEMENT Left 07/29/2020   Procedure: LEFT ARM BRACHIOCEPHALIC ARTERIOVENOUS (AV) FISTULA CREATION;  Surgeon: Maeola Harman, MD;  Location: [facility] OR;  Service: Vascular;  Laterality: Left;   BASCILIC VEIN TRANSPOSITION Left 11/09/2020    Procedure: LEFT BRACHIOCEPHALIC ARTERIOVENOUS FISTULA TRANSPOSITION;  Surgeon: Maeola Harman, MD;  Location: [facility] OR;  Service: Vascular;  Laterality: Left;   CORONARY STENT INTERVENTION N/A 08/21/2022   Procedure: CORONARY STENT INTERVENTION;  Surgeon: Yates Decamp, MD;  Location: [facility] INVASIVE CV LAB;  Service: Cardiovascular;  Laterality: N/A;   INSERTION OF DIALYSIS CATHETER Left 02/02/2020   Procedure: INSERTION OF LEFT INTERNAL JUGULAR 28 CM TUNNELED DIALYSIS CATHETER UNDER ULTRASOUND GUIDANCE;  Surgeon: Cephus Shelling, MD;  Location: [facility] OR;  Service: Vascular;  Laterality: Left;   KIDNEY TRANSPLANT  2014   at [facility]   LEFT HEART CATH AND CORONARY ANGIOGRAPHY N/A 08/21/2022   Procedure: LEFT HEART CATH AND CORONARY ANGIOGRAPHY;  Surgeon: Orpah Cobb, MD;  Location: [facility]VAS[facility] CV LAB;  Service: Cardiovascular;  Laterality: N/A;   LIGATION OF COMPETING BRANCHES OF ARTERIOVENOUS FISTULA Left 11/09/2020   Procedure: LIGATION OF COMPETING BRANCHES OF LEFT BRACHIOCEPHALIC ARTERIOVENOUS FISTULA;  Surgeon: Maeola Harman, MD;  Location: [facility] OR;  Service: Vascular;  Laterality: Left;   PARATHYROIDECTOMY  2014   at [facility]   PERIPHERAL VASCULAR INTERVENTION Right 02/19/2020   Procedure: PERIPHERAL VASCULAR INTERVENTION;  Surgeon: Nada Libman, MD;  Location: [facility];  Service: Cardiovascular;  Laterality: Right;   PERIPHERAL VASCULAR INTERVENTION Right 08/21/2022   Procedure: PERIPHERAL VASCULAR INTERVENTION;  Surgeon: Yates Decamp, MD;  Location: [facility];  Service: Cardiovascular;  Laterality: Right;   REVISON OF ARTERIOVENOUS FISTULA Right 05/22/2020   Procedure: REPAIR OF RIGHT ARTERIOVENOUS FISTULA;  Surgeon: Maeola Harman, MD;  Location: [facility] OR;  Service: Vascular;  Laterality: Right;   UPPER EXTREMITY VENOGRAPHY Bilateral 04/09/2023   Procedure: UPPER EXTREMITY VENOGRAPHY;  Surgeon: Maeola Harman, MD;  Location: [facility] INVASIVE CV LAB;  Service:  Cardiovascular;  Laterality: Bilateral;    Short Social History:  Social History   Tobacco Use   Smoking status: Former    Types: Cigars   Smokeless tobacco: Never   Tobacco comments:    2  times a week-vaping  Substance Use Topics   Alcohol use: Yes    Alcohol/week: 1.0 standard drink of alcohol    Types: 1 Standard drinks or equivalent per week    Allergies  Allergen Reactions   Alcohol Rash    Causes skin irritation   Chlorhexidine Itching    Burns skin   Other Other (See Comments)    Local anesthesia (quickly metabolizes)   Tape Other (See Comments)    Adhesive tape (irritates skin)    Current Outpatient Medications  Medication Sig Dispense Refill   acetaminophen (TYLENOL) 500 MG tablet Take 1,000 mg by mouth every 6 (six) hours as needed for mild pain.     amLODipine (NORVASC) 2.5 MG tablet Take 2.5 mg by mouth every Monday, Wednesday, and Friday.     aspirin EC 81 MG tablet Take 1 tablet (81 mg total) by mouth daily. Swallow whole. 30 tablet 0   atorvastatin (LIPITOR) 40 MG tablet Take 40 mg by mouth daily.     calcium acetate (PHOSLO) 667 MG capsule Take 1,334-2,668 mg by mouth with breakfast, with lunch, and with evening meal. Take 4 capsules (2,668 mg) by mouth with each meal & take 2 capsules (1,334 mg) by mouth with snacks     clopidogrel (PLAVIX) 75 MG tablet Take 75 mg by mouth daily.     metoprolol succinate (TOPROL-XL) 25 MG 24 hr tablet Take 2 tablets (50 mg total) by mouth daily. 60 tablet 2   multivitamin (RENA-VIT) TABS tablet Take 1 tablet by mouth every evening.     nitroGLYCERIN (NITROSTAT) 0.4 MG SL tablet Place 1 tablet (0.4 mg total) under the tongue every 5 (five) minutes as needed for chest pain. 30 tablet 0   Tenapanor HCl, CKD, (XPHOZAH) 30 MG TABS Take 30 mg by mouth 2 (two) times daily.     No current facility-administered medications for this visit.    Review of Systems  Constitutional:  Constitutional negative. HENT: HENT negative.   Eyes: Eyes negative.  Respiratory: Respiratory negative.  Cardiovascular: Cardiovascular negative.  GI: Gastrointestinal negative.  Musculoskeletal: Musculoskeletal negative.  Skin: Skin negative.  Neurological: Neurological negative. Hematologic: Hematologic/lymphatic negative.  Psychiatric: Psychiatric negative.        Objective:  Objective  Vitals:   06/13/23 1408  BP: 107/78  Pulse: (!) 47  Temp: 98.1 F (36.7 C)  SpO2: 93%     Physical Exam HENT:     Head: Normocephalic.  Eyes:     Pupils: Pupils are equal, round, and reactive to light.  Cardiovascular:     Rate and Rhythm: Normal rate.     Pulses:          Dorsalis pedis pulses are 1+ on the right side and 1+ on the left side.       Posterior tibial pulses are 1+ on the right side and 1+ on the left side.  Abdominal:     General: Abdomen is flat.  Skin:    Capillary Refill: Capillary refill takes 2 to 3 seconds.  Neurological:     General: No focal deficit present.     Mental Status: He is alert.  Psychiatric:        Mood and Affect: Mood normal.        Thought Content: Thought content normal.        Judgment: Judgment normal.     Data: ABI Findings:  +---------+------------------+-----+--------+--------+  Right   Rt Pressure (mmHg)IndexWaveformComment   +---------+------------------+-----+--------+--------+  Brachial 136                                      +---------+------------------+-----+--------+--------+  PTA     111               0.82 biphasic          +---------+------------------+-----+--------+--------+  DP      122               0.90 biphasic          +---------+------------------+-----+--------+--------+  Great Toe92                0.68 Abnormal          +---------+------------------+-----+--------+--------+   +---------+------------------+-----+--------+-------+  Left    Lt Pressure (mmHg)IndexWaveformComment   +---------+------------------+-----+--------+-------+  Brachial 135                                     +---------+------------------+-----+--------+-------+  PTA     123               0.90 biphasic         +---------+------------------+-----+--------+-------+  DP      122               0.90 biphasic         +---------+------------------+-----+--------+-------+  Great Toe91                0.67 Abnormal         +---------+------------------+-----+--------+-------+   +-------+-----------+-----------+------------+------------+  ABI/TBIToday's ABIToday's TBIPrevious ABIPrevious TBI  +-------+-----------+-----------+------------+------------+  Right 0.90       0.68                                 +-------+-----------+-----------+------------+------------+  Left  0.90       0.67                                 +-------+-----------+-----------+------------+------------+           Summary:  Right: Resting right ankle-brachial index indicates mild right lower  extremity arterial disease. The right toe-brachial index is normal.   Left: Resting left ankle-brachial index indicates mild left lower  extremity arterial disease. The left toe-brachial index is normal.       Assessment/Plan:    51 year old male history of end-stage renal disease was diagnosed above currently using the catheter.  His ABIs would likely support femoral graft particularly on the left where he has 1+ palpable pulses however he would be high risk given his size for any infection.  We have discussed his options being continued catheter use, conversion to  HeRo graft and placing a temporary tunneled catheter in the groin versus femoral bypass graft.  He demonstrates good understanding at this time would like to continue with his left IJ tunneled dialysis catheter will call to follow-up if he requires any additional access.     Maeola Harman MD Vascular and Vein  [facility]

## 2023-09-20 ENCOUNTER — Ambulatory Visit (HOSPITAL_COMMUNITY)
Admission: RE | Admit: 2023-09-20 | Discharge: 2023-09-20 | Disposition: A | Discharge status: Home / Self Care | Attending: Nephrology | Admitting: Nephrology

## 2023-09-20 ENCOUNTER — Encounter (HOSPITAL_COMMUNITY)
Admission: RE | Disposition: A | Discharge status: Home / Self Care | Payer: Self-pay | Source: Home / Self Care | Attending: Nephrology

## 2023-09-20 ENCOUNTER — Other Ambulatory Visit: Payer: Self-pay

## 2023-09-20 SURGERY — TUNNELLED CATHETER EXCHANGE

## 2023-09-20 MED ORDER — HEPARIN SODIUM (PORCINE) 1000 UNIT/ML IJ SOLN
INTRAMUSCULAR | Status: AC
  Filled 2023-09-20: qty 10

## 2023-09-20 NOTE — H&P (Signed)
 Chief Complaint: Bleeding from the venous port of the left IJ tunnel catheter  Assessment/Plan: ESRD dialyzing MWF regimen with last dialysis Wednesday BLeeding from the venous port of the left IJ tunnel catheter-I placed a 10 cc syringe and aspirated, flushed and could not reproduce any leakage.  There is no crack or area being introduced either.  Initially there is very sluggish flows but then subsequently very good flows consistently.  We packed it with heparin and a dressing.  Patient has severe allergies to tape and we did not apply any adhesive to his skin. 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 Anemia - managed with ESA's and IV iron at dialysis center. HTN - resume home regimen.   HPI: Jerry [name] is an 53 y.o. male history of complication with Benadryl stating that it knocks her out, hypertension, peripheral arterial disease, OSA, ESRD referred for bleeding from the venous port of his left tunneled dialysis catheter.  ROS Per HPI.  Chemistry and CBC: Creatinine, Ser  Date/Time Value Ref Range Status  06/06/2023 11:25 PM 11.89 (H) 0.61 - 1.24 mg/dL Final  16/04/9603 54:09 AM 9.25 (H) 0.61 - 1.24 mg/dL Final  81/19/1478 29:56 AM 8.63 (H) 0.61 - 1.24 mg/dL Final  21/30/8657 84:69 AM 14.30 (H) 0.61 - 1.24 mg/dL Final  62/95/2841 32:44 AM 12.67 (H) 0.61 - 1.24 mg/dL Final  07/19/7251 66:44 AM 9.13 (H) 0.61 - 1.24 mg/dL Final  03/47/4259 56:38 AM 14.50 (H) 0.61 - 1.24 mg/dL Final  75/64/3329 51:88 PM 11.08 (H) 0.61 - 1.24 mg/dL Final  41/66/0630 16:01 AM 8.32 (H) 0.61 - 1.24 mg/dL Final  09/32/3557 32:20 AM 10.83 (H) 0.61 - 1.24 mg/dL Final  25/42/7062 37:62 AM 7.39 (H) 0.61 - 1.24 mg/dL Final  83/15/1761 60:73 PM 10.97 (H) 0.61 - 1.24 mg/dL Final  71/12/2692 85:46 AM 11.80 (H) 0.61 - 1.24 mg/dL Final  27/09/5007 38:18 AM 9.80 (H) 0.61 - 1.24 mg/dL Final  29/93/7169 67:89 PM 13.83 (H) 0.61 - 1.24 mg/dL Final  38/04/1750 02:58 PM 13.13 (H) 0.61 - 1.24 mg/dL  Final  52/77/8242 35:36 AM 11.64 (H) 0.61 - 1.24 mg/dL Final  14/43/1540 08:67 AM 13.30 (H) 0.61 - 1.24 mg/dL Final  61/95/0932 67:12 AM 10.60 (H) 0.61 - 1.24 mg/dL Final  45/80/9983 38:25 AM 11.76 (H) 0.61 - 1.24 mg/dL Final  05/39/7673 41:93 AM 8.04 (H) 0.61 - 1.24 mg/dL Final    Comment:    DELTA CHECK NOTED DIALYSIS   02/02/2020 08:34 AM 13.88 (H) 0.61 - 1.24 mg/dL Final  79/08/4095 35:32 AM 11.76 (H) 0.61 - 1.24 mg/dL Final  99/24/2683 41:96 PM 11.50 (H) 0.61 - 1.24 mg/dL Final  22/29/7989 21:19 AM 14.50 (H) 0.61 - 1.24 mg/dL Final  41/74/0814 48:18 AM 10.95 (H) 0.61 - 1.24 mg/dL Final  56/31/4970 26:37 AM 14.99 (H) 0.61 - 1.24 mg/dL Final  85/88/5027 74:12 AM 13.46 (H) 0.61 - 1.24 mg/dL Final  87/86/7672 09:47 PM 19.35 (H) 0.61 - 1.24 mg/dL Final  09/62/8366 29:47 AM 19.14 (H) 0.61 - 1.24 mg/dL Final  65/46/5035 46:56 AM 19.14 (H) 0.61 - 1.24 mg/dL Final  81/27/5170 01:74 PM 19.32 (H) 0.61 - 1.24 mg/dL Final   No results for input(s): "NA", "K", "CL", "CO2", "GLUCOSE", "BUN", "CREATININE", "CALCIUM", "PHOS" in the last 168 hours.  Invalid input(s): "ALB" No results for input(s): "WBC", "NEUTROABS", "HGB", "HCT", "MCV", "PLT" in the last 168 hours. Liver Function Tests: No results for input(s): "AST", "ALT", "ALKPHOS", "BILITOT", "PROT", "ALBUMIN" in the last 168 hours.  No results for input(s): "LIPASE", "AMYLASE" in the last 168 hours. No results for input(s): "AMMONIA" in the last 168 hours. Cardiac Enzymes: No results for input(s): "CKTOTAL", "CKMB", "CKMBINDEX", "TROPONINI" in the last 168 hours. Iron Studies: No results for input(s): "IRON", "TIBC", "TRANSFERRIN", "FERRITIN" in the last 72 hours. PT/INR: @LABRCNTIP (inr:5)  Xrays/Other Studies: )No results found for this or any previous visit (from the past 48 hours). No results found.  PMH:   Past Medical History:  Diagnosis Date   Complication of anesthesia    "benadryl knocks me out; worse than any normal  reactions; anesthesia/numbing RX wear off FAST" (01/30/2018) 2021 Took 3 weeks to able talk and eat soild food due to throat    ESRD (end stage renal disease) on dialysis (HCC)    "HAD DIALYSIS BEFORE TRANSPLANT; RESTARTED DIALYSIS 01/30/2018)-  [facility] - MWF   GERD (gastroesophageal reflux disease)    History of blood transfusion    Hypertension    OSA on CPAP    Peripheral arterial disease (HCC)    Pneumonia     PSH:   Past Surgical History:  Procedure Laterality Date   A/V FISTULAGRAM N/A 02/19/2020   Procedure: A/V FISTULAGRAM;  Surgeon: Nada Libman, MD;  Location: [facility] INVASIVE [facility];  Service: Cardiovascular;  Laterality: N/A;   AV FISTULA PLACEMENT Right 2014   AV FISTULA PLACEMENT Right 12/26/2019   Procedure: Repair and Revision of Right Forearm AV Fistula;  Surgeon: Larina Earthly, MD;  Location: [facility] OR;  Service: Vascular;  Laterality: Right;   AV FISTULA PLACEMENT Left 07/29/2020   Procedure: LEFT ARM BRACHIOCEPHALIC ARTERIOVENOUS (AV) FISTULA CREATION;  Surgeon: Maeola Harman, MD;  Location: [facility] OR;  Service: Vascular;  Laterality: Left;   BASCILIC VEIN TRANSPOSITION Left 11/09/2020   Procedure: LEFT BRACHIOCEPHALIC ARTERIOVENOUS FISTULA TRANSPOSITION;  Surgeon: Maeola Harman, MD;  Location: [facility] OR;  Service: Vascular;  Laterality: Left;   CORONARY STENT INTERVENTION N/A 08/21/2022   Procedure: CORONARY STENT INTERVENTION;  Surgeon: Yates Decamp, MD;  Location: [facility];  Service: Cardiovascular;  Laterality: N/A;   INSERTION OF DIALYSIS CATHETER Left 02/02/2020   Procedure: INSERTION OF LEFT INTERNAL JUGULAR 28 CM TUNNELED DIALYSIS CATHETER UNDER ULTRASOUND GUIDANCE;  Surgeon: Cephus Shelling, MD;  Location: [facility] OR;  Service: Vascular;  Laterality: Left;   KIDNEY TRANSPLANT  2014   at [facility]   LEFT HEART CATH AND CORONARY ANGIOGRAPHY N/A 08/21/2022   Procedure: LEFT HEART CATH AND CORONARY ANGIOGRAPHY;  Surgeon: Orpah Cobb, MD;  Location: [facility]  INVAS[facility] CV LAB;  Service: Cardiovascular;  Laterality: N/A;   LIGATION OF COMPETING BRANCHES OF ARTERIOVENOUS FISTULA Left 11/09/2020   Procedure: LIGATION OF COMPETING BRANCHES OF LEFT BRACHIOCEPHALIC ARTERIOVENOUS FISTULA;  Surgeon: Maeola Harman, MD;  Location: [facility] OR;  Service: Vascular;  Laterality: Left;   PARATHYROIDECTOMY  2014   at [facility]   PERIPHERAL VASCULAR INTERVENTION Right 02/19/2020   Procedure: PERIPHERAL VASCULAR INTERVENTION;  Surgeon: Nada Libman, MD;  Location: [facility];  Service: Cardiovascular;  Laterality: Right;   PERIPHERAL VASCULAR INTERVENTION Right 08/21/2022   Procedure: PERIPHERAL VASCULAR INTERVENTION;  Surgeon: Yates Decamp, MD;  Location: [facility];  Service: Cardiovascular;  Laterality: Right;   REVISON OF ARTERIOVENOUS FISTULA Right 05/22/2020   Procedure: REPAIR OF RIGHT ARTERIOVENOUS FISTULA;  Surgeon: Maeola Harman, MD;  Location: [facility] OR;  Service: Vascular;  Laterality: Right;   UPPER EXTREMITY VENOGRAPHY Bilateral 04/09/2023   Procedure: UPPER EXTREMITY VENOGRAPHY;  Surgeon: Maeola Harman, MD;  Location: [facility] INVASIVE CV LAB;  Service: Cardiovascular;  Laterality: Bilateral;    Allergies:  Allergies  Allergen Reactions   Alcohol Rash    Causes skin irritation   Chlorhexidine Itching    Burns skin   Other Other (See Comments)    Local anesthesia (quickly metabolizes)   Tape Other (See Comments)    Adhesive tape (irritates skin)    Medications:   Prior to Admission medications   Medication Sig Start Date End Date Taking? Authorizing Provider  acetaminophen (TYLENOL) 500 MG tablet Take 1,000 mg by mouth every 6 (six) hours as needed for mild pain.   Yes [provider]  amLODipine (NORVASC) 2.5 MG tablet Take 2.5 mg by mouth every Monday, Wednesday, and Friday. 05/09/23  Yes [provider]  amLODipine (NORVASC) 5 MG tablet Take 5 mg by mouth See admin instructions. Take Tues.,  Thurs., Sat., and Sun.   Yes [provider]  aspirin EC 81 MG tablet Take 1 tablet (81 mg total) by mouth daily. Swallow whole. 08/18/22  Yes Tyrone Nine, MD  atorvastatin (LIPITOR) 40 MG tablet Take 40 mg by mouth daily. 03/31/23  Yes [provider]  calcium acetate (PHOSLO) 667 MG capsule Take 1,334-2,668 mg by mouth with breakfast, with lunch, and with evening meal. Take 3 capsules (2,001 mg) by mouth with each meal & take 2 capsules (1,334 mg) by mouth with snacks 12/01/19  Yes [provider]  clopidogrel (PLAVIX) 75 MG tablet Take 75 mg by mouth daily. 04/01/23  Yes [provider]  metoprolol succinate (TOPROL-XL) 25 MG 24 hr tablet Take 2 tablets (50 mg total) by mouth daily. 05/31/23 09/19/23 Yes Darrick Grinder, PA-C  multivitamin (RENA-VIT) TABS tablet Take 1 tablet by mouth every evening.   Yes [provider]  nitroGLYCERIN (NITROSTAT) 0.4 MG SL tablet Place 1 tablet (0.4 mg total) under the tongue every 5 (five) minutes as needed for chest pain. 05/31/23  Yes Darrick Grinder, PA-C  Tenapanor HCl, CKD, (XPHOZAH) 30 MG TABS Take 30 mg by mouth 2 (two) times daily. 09/20/22  Yes [provider]  Doxercalciferol (HECTOROL IV) Doxercalciferol (Hectorol) 09/10/23 09/08/24  [provider]  sodium zirconium cyclosilicate (LOKELMA) 10 g PACK packet Take 10 g by mouth daily. 09/14/23   [provider]    Discontinued Meds:  There are no discontinued medications.  Social History:  reports that he has quit smoking. His smoking use included cigars. He has never used smokeless tobacco. He reports current alcohol use of about 1.0 standard drink of alcohol per week. He reports that he does not currently use drugs.  Family History:   Family History  Problem Relation Age of Onset   Heart failure Mother    Kidney failure Father     There were no vitals taken for this visit. GEN: NAD, A&Ox3, NCAT HEENT: No conjunctival pallor,  EOMI NECK: Supple, no thyromegaly LUNGS: CTA B/L no rales, rhonchi or wheezing CV: RRR, No M/R/G ABD: SNDNT +BS  EXT: No lower extremity edema ACCESS: Etter Sjogren, MD 09/20/2023, 12:55 PM

## 2024-02-12 ENCOUNTER — Other Ambulatory Visit: Payer: Self-pay | Admitting: Cardiology

## 2024-02-12 DIAGNOSIS — R079 Chest pain, unspecified: Secondary | ICD-10-CM

## 2024-02-21 ENCOUNTER — Encounter (HOSPITAL_COMMUNITY)
Admission: RE | Admit: 2024-02-21 | Discharge: 2024-02-21 | Disposition: A | Discharge status: Home / Self Care | Source: Ambulatory Visit | Attending: Cardiology | Admitting: Cardiology

## 2024-02-21 ENCOUNTER — Encounter (HOSPITAL_COMMUNITY): Payer: Self-pay

## 2024-02-21 DIAGNOSIS — R079 Chest pain, unspecified: Secondary | ICD-10-CM | POA: Diagnosis present

## 2024-02-21 MED ORDER — TECHNETIUM TC 99M TETROFOSMIN IV KIT
30.0000 | PACK | Freq: Once | INTRAVENOUS | Status: AC
  Administered 2024-02-21: 30 via INTRAVENOUS

## 2024-02-21 MED ORDER — REGADENOSON 0.4 MG/5ML IV SOLN
0.4000 mg | Freq: Once | INTRAVENOUS | Status: AC
  Administered 2024-02-21: 0.4 mg via INTRAVENOUS

## 2024-02-21 MED ORDER — TECHNETIUM TC 99M TETROFOSMIN IV KIT
10.1000 | PACK | Freq: Once | INTRAVENOUS | Status: AC
  Administered 2024-02-21: 10.1 via INTRAVENOUS

## 2024-02-21 MED ORDER — TECHNETIUM TC 99M TETROFOSMIN IV KIT
10.0000 | PACK | Freq: Once | INTRAVENOUS | Status: AC | PRN
  Administered 2024-02-21: 10 via INTRAVENOUS

## 2024-02-21 MED ORDER — REGADENOSON 0.4 MG/5ML IV SOLN
INTRAVENOUS | Status: AC
  Filled 2024-02-21: qty 5
